# Patient Record
Sex: Female | Born: 1935
Health system: Southern US, Community
[De-identification: ages and names within clinical notes are randomized; demographics above are authoritative.]

## PROBLEM LIST (undated history)

## (undated) ENCOUNTER — Emergency Department (HOSPITAL_COMMUNITY): Admission: EM | Payer: Self-pay | Source: Home / Self Care

## (undated) DIAGNOSIS — N289 Disorder of kidney and ureter, unspecified: Secondary | ICD-10-CM

## (undated) DIAGNOSIS — E079 Disorder of thyroid, unspecified: Secondary | ICD-10-CM

## (undated) DIAGNOSIS — E7800 Pure hypercholesterolemia, unspecified: Secondary | ICD-10-CM

## (undated) DIAGNOSIS — I10 Essential (primary) hypertension: Secondary | ICD-10-CM

## (undated) HISTORY — PX: ABDOMINAL HYSTERECTOMY: SHX81

## (undated) HISTORY — PX: CHOLECYSTECTOMY: SHX55

---

## 2000-02-16 ENCOUNTER — Other Ambulatory Visit: Admission: RE | Admit: 2000-02-16 | Discharge: 2000-02-16 | Payer: Self-pay | Admitting: Obstetrics and Gynecology

## 2000-12-18 ENCOUNTER — Ambulatory Visit (HOSPITAL_COMMUNITY): Admission: RE | Admit: 2000-12-18 | Discharge: 2000-12-18 | Payer: Self-pay | Admitting: Endocrinology

## 2000-12-18 ENCOUNTER — Encounter: Payer: Self-pay | Admitting: Endocrinology

## 2001-08-15 ENCOUNTER — Other Ambulatory Visit: Admission: RE | Admit: 2001-08-15 | Discharge: 2001-08-15 | Payer: Self-pay | Admitting: Obstetrics and Gynecology

## 2006-07-02 ENCOUNTER — Emergency Department (HOSPITAL_COMMUNITY): Admission: EM | Admit: 2006-07-02 | Discharge: 2006-07-02 | Payer: Self-pay | Admitting: Emergency Medicine

## 2006-10-31 ENCOUNTER — Ambulatory Visit (HOSPITAL_COMMUNITY): Admission: RE | Admit: 2006-10-31 | Discharge: 2006-10-31 | Payer: Self-pay | Admitting: *Deleted

## 2006-11-21 ENCOUNTER — Encounter (HOSPITAL_COMMUNITY): Admission: RE | Admit: 2006-11-21 | Discharge: 2006-11-22 | Payer: Self-pay | Admitting: Endocrinology

## 2007-06-21 ENCOUNTER — Emergency Department (HOSPITAL_COMMUNITY): Admission: EM | Admit: 2007-06-21 | Discharge: 2007-06-21 | Payer: Self-pay | Admitting: Emergency Medicine

## 2007-06-22 ENCOUNTER — Inpatient Hospital Stay (HOSPITAL_COMMUNITY): Admission: EM | Admit: 2007-06-22 | Discharge: 2007-06-25 | Payer: Self-pay | Admitting: Emergency Medicine

## 2007-06-23 ENCOUNTER — Encounter (INDEPENDENT_AMBULATORY_CARE_PROVIDER_SITE_OTHER): Payer: Self-pay | Admitting: Gastroenterology

## 2008-01-17 ENCOUNTER — Emergency Department (HOSPITAL_COMMUNITY): Admission: EM | Admit: 2008-01-17 | Discharge: 2008-01-17 | Payer: Self-pay | Admitting: Emergency Medicine

## 2008-06-06 ENCOUNTER — Emergency Department (HOSPITAL_COMMUNITY): Admission: EM | Admit: 2008-06-06 | Discharge: 2008-06-06 | Payer: Self-pay | Admitting: *Deleted

## 2008-06-23 ENCOUNTER — Emergency Department (HOSPITAL_COMMUNITY): Admission: EM | Admit: 2008-06-23 | Discharge: 2008-06-23 | Payer: Self-pay | Admitting: Emergency Medicine

## 2008-08-04 ENCOUNTER — Emergency Department (HOSPITAL_COMMUNITY): Admission: EM | Admit: 2008-08-04 | Discharge: 2008-08-04 | Payer: Self-pay | Admitting: *Deleted

## 2008-08-08 ENCOUNTER — Emergency Department (HOSPITAL_COMMUNITY): Admission: EM | Admit: 2008-08-08 | Discharge: 2008-08-08 | Payer: Self-pay | Admitting: Emergency Medicine

## 2008-08-20 ENCOUNTER — Ambulatory Visit (HOSPITAL_COMMUNITY): Admission: RE | Admit: 2008-08-20 | Discharge: 2008-08-20 | Payer: Self-pay | Admitting: Otolaryngology

## 2008-08-20 ENCOUNTER — Encounter (INDEPENDENT_AMBULATORY_CARE_PROVIDER_SITE_OTHER): Payer: Self-pay | Admitting: Otolaryngology

## 2008-08-20 ENCOUNTER — Ambulatory Visit: Payer: Self-pay | Admitting: Vascular Surgery

## 2009-06-25 ENCOUNTER — Emergency Department (HOSPITAL_COMMUNITY): Admission: EM | Admit: 2009-06-25 | Discharge: 2009-06-25 | Payer: Self-pay | Admitting: Emergency Medicine

## 2009-07-12 ENCOUNTER — Encounter: Admission: RE | Admit: 2009-07-12 | Discharge: 2009-07-12 | Payer: Self-pay | Admitting: Internal Medicine

## 2010-12-24 ENCOUNTER — Emergency Department (HOSPITAL_COMMUNITY): Payer: Medicare Other

## 2010-12-24 ENCOUNTER — Emergency Department (HOSPITAL_COMMUNITY)
Admission: EM | Admit: 2010-12-24 | Discharge: 2010-12-24 | Disposition: A | Discharge status: Home / Self Care | Payer: Medicare Other | Attending: Emergency Medicine | Admitting: Emergency Medicine

## 2010-12-24 DIAGNOSIS — M79609 Pain in unspecified limb: Secondary | ICD-10-CM | POA: Insufficient documentation

## 2010-12-24 DIAGNOSIS — M5030 Other cervical disc degeneration, unspecified cervical region: Secondary | ICD-10-CM | POA: Insufficient documentation

## 2010-12-24 DIAGNOSIS — M542 Cervicalgia: Secondary | ICD-10-CM | POA: Insufficient documentation

## 2010-12-24 DIAGNOSIS — K449 Diaphragmatic hernia without obstruction or gangrene: Secondary | ICD-10-CM | POA: Insufficient documentation

## 2010-12-24 DIAGNOSIS — E039 Hypothyroidism, unspecified: Secondary | ICD-10-CM | POA: Insufficient documentation

## 2010-12-24 DIAGNOSIS — I10 Essential (primary) hypertension: Secondary | ICD-10-CM | POA: Insufficient documentation

## 2010-12-24 LAB — POCT I-STAT, CHEM 8
HCT: 36 % (ref 36.0–46.0)
Hemoglobin: 12.2 g/dL (ref 12.0–15.0)
Potassium: 3.9 mEq/L (ref 3.5–5.1)
Sodium: 140 mEq/L (ref 135–145)

## 2010-12-24 LAB — POCT CARDIAC MARKERS
CKMB, poc: 2 ng/mL (ref 1.0–8.0)
Myoglobin, poc: 151 ng/mL (ref 12–200)

## 2011-01-27 LAB — POCT CARDIAC MARKERS
CKMB, poc: 1.1 ng/mL (ref 1.0–8.0)
Troponin i, poc: <0.05 ng/mL (ref 0.00–0.09)

## 2011-01-27 LAB — URINALYSIS, ROUTINE W REFLEX MICROSCOPIC
Bilirubin Urine: NEGATIVE
Ketones, ur: NEGATIVE mg/dL
Nitrite: NEGATIVE
Protein, ur: NEGATIVE mg/dL
Urobilinogen, UA: 0.2 mg/dL (ref 0.0–1.0)
pH: 5.5 (ref 5.0–8.0)

## 2011-01-27 LAB — URINE CULTURE
Colony Count: NO GROWTH
Culture: NO GROWTH

## 2011-01-27 LAB — DIFFERENTIAL
Eosinophils Absolute: 0.1 10*3/uL (ref 0.0–0.7)
Lymphocytes Relative: 18 % (ref 12–46)
Lymphs Abs: 1.1 10*3/uL (ref 0.7–4.0)
Monocytes Relative: 5 % (ref 3–12)
Neutrophils Relative %: 76 % (ref 43–77)

## 2011-01-27 LAB — CBC
MCHC: 34 g/dL (ref 30.0–36.0)
MCV: 90.7 fL (ref 78.0–100.0)
RDW: 12.4 % (ref 11.5–15.5)

## 2011-01-27 LAB — COMPREHENSIVE METABOLIC PANEL
ALT: 13 U/L (ref 0–35)
AST: 22 U/L (ref 0–37)
Calcium: 8.6 mg/dL (ref 8.4–10.5)
Creatinine, Ser: 1.23 mg/dL — ABNORMAL HIGH (ref 0.4–1.2)
GFR calc Af Amer: 52 mL/min — ABNORMAL LOW (ref >60)
Glucose, Bld: 90 mg/dL (ref 70–99)
Sodium: 134 mEq/L — ABNORMAL LOW (ref 135–145)
Total Protein: 8 g/dL (ref 6.0–8.3)

## 2011-01-27 LAB — LIPASE, BLOOD: Lipase: 23 U/L (ref 11–59)

## 2011-03-07 NOTE — Discharge Summary (Signed)
Jennifer Patel, Jennifer Patel                ACCOUNT NO.:  [id number]   MEDICAL RECORD NO.:  [id number]          PATIENT TYPE:  INP   LOCATION:  1405                         FACILITY:  [facility]   PHYSICIAN:  Aquilla Hacker, M.D. DATE OF BIRTH:  08-30-1932   DATE OF ADMISSION:  06/22/2007  DATE OF DISCHARGE:  06/25/2007                               DISCHARGE SUMMARY   FINAL DIAGNOSES:  1. Ischemic colitis.  2. Acute blood loss anemia.  3. Hypertension, uncontrolled.   PROCEDURES:  1. Transfusion of 2 units of packed red blood cells.  2. Colonoscopy completed on June 23, 2007.  3. Two-view chest x-ray completed June 21, 2007.  4. CT scan of the head without contrast, June 21, 2007.  5. CT of abdomen with contrast, June 22, 2007.  6. CT of the pelvis, June 22, 2007.   CONSULTATIONS:  Gastroenterology with Dr. Nelwyn Salisbury.   HISTORY OF PRESENT ILLNESS:  Jennifer Patel is a 77 year old female who  arrived with a chief complaint of passing bright red blood from her  rectum.  Her symptoms started the night prior to the admission.  She  indicated that she went to the bathroom and began to have several  episodes of bright red blood with stool and sometimes without stool.  She also complained of some abdominal pain, particularly in the left  lower quadrant; therefore, she came to the hospital for further  evaluation.   PAST MEDICAL HISTORY:  Please see that dictated by Dr. Liston Alba.   HOSPITAL COURSE:  PROBLEM #1 - ISCHEMIC COLITIS:  A two-view chest x-ray  was completed on June 21, 2007 and revealed no acute chest findings.  A hiatal hernia and COPD were suspected.  The patient also had a CT scan  of her head completed without contrast, which revealed age-indeterminate  small vessel ischemic white matter changes.  Symmetric oval shape of the  orbits was also noted.  A CT scan of the patient's abdomen and pelvis  was completed on August 30.  A CT scan of the abdomen  revealed no acute  findings in the abdomen.  CT scan of the pelvis revealed colitis  extending from the splenic flexure for approximately 10 cm down the  descending colon; this could be infectious, inflammatory or ischemic.  The appearance was not typical for diverticulitis.  Gastroenterology was  consulted; Dr. Nelwyn Salisbury saw the patient and on June 23, 2007,  she did a colonoscopy.  Her impression from the colonoscopy was that the  patient had sigmoid diverticulosis.  Ischemic colitis was noted from 50-  90 cm; cold biopsies were completed.  After the completion of the  patient's colonoscopy, the patient indicated that her abdominal  discomfort had decreased significantly; likewise, she has not complained  of any ongoing bright red blood being passed from her rectum.  She was  maintained on Protonix.   PROBLEM #2 - ACUTE BLOOD LOSS ANEMIA:  The patient's hemoglobin at the  time of her hospitalization was noted to have been 10.7 with hematocrit  of 31.  By September 1, the  patient's hemoglobin had drifted down to  8.6; therefore, the patient received 2 units of packed red blood cells.  She was otherwise asymptomatic.   PROBLEM #3 - HYPERTENSION:  The patient's blood pressure has not been  ideally controlled over the course of this hospitalization.  She can  follow up with her primary care physician, at which time he may be able  to adjust her medications to optimally control her blood pressure.   CONDITION AT TIME OF DISCHARGE:  Improved.  Currently, the patient has  no complaints.   DISCHARGE VITAL SIGNS:  VITALS:  Her temperature is 98.3, heart rate 70,  respirations 16, blood pressure 149/77.  O2 SAT is 96% on room air.   LABORATORY DATA:  Her sodium is 142, her potassium is 4.4, chloride 112,  CO2 22, glucose 79, BUN 15, creatinine 1.04.  Her calcium is 9.  Her  white blood cell count is 6.5, hemoglobin 12.2, hematocrit 35.6,  platelets 158,000.  The decision has been  made to discharge the patient  from the hospital.   [place]:  1. Nexium 40 mg p.o. daily.  2. Crestor 5 mg once a day.  3. Aspirin 81 mg once a day.   FOLLOWUP:  She will be told to follow up with her primary care doctor  within the next 2-4 weeks.      Aquilla Hacker, M.D.  Electronically Signed     OR/MEDQ  D:  06/25/2007  T:  06/25/2007  Job:  [id number]

## 2011-03-07 NOTE — Op Note (Signed)
Jennifer Patel, Jennifer Patel                ACCOUNT NO.:  [id number]   MEDICAL RECORD NO.:  [id number]          PATIENT TYPE:  INP   LOCATION:  1405                         [facility]ACILITY:  [facility]   PHYSICIAN:  Nelwyn Salisbury, M.D.  DATE OF BIRTH:  01/05/1932   DATE OF PROCEDURE:  06/23/2007  DATE OF DISCHARGE:                               OPERATIVE REPORT   PROCEDURE PERFORMED:  Flexible sigmoidoscopy with multiple cold  biopsies.   ENDOSCOPIST:  Nelwyn Salisbury, MD   INSTRUMENT USED:  Pentax video colonoscope.   INDICATIONS FOR PROCEDURE:  [age]-year-old white female with a  history of rectal bleeding that started on June 21, 2007 resulting in  a drop in hemoglobin from 11.9 g/dL to 10.7 g/dL yesterday to 9.8 g/dL  today.  The patient had some left lower quadrant pain, rule out ischemic  colitis.   PREPROCEDURE PREPARATION:  Informed consent was procured from the  patient.  The patient given an enema prior to the procedure.  Risks and  benefits of the procedure were discussed with the patient in detail.   PREPROCEDURE PHYSICAL:  VITAL SIGNS:  The patient had stable vital  signs.  NECK:  Supple.  CHEST:  Clear to auscultation.  S1 and S2 regular.  ABDOMEN:  Soft with normal bowel sounds.  There is left lower quadrant  tenderness on palpation with no guarding, rebound or rigidity.  No  hepatosplenomegaly appreciated.   DESCRIPTION OF THE PROCEDURE:  The patient was placed in the left  lateral decubitus position and sedated with 50 mcg of fentanyl and 5 mg  of Versed given intravenously in slow incremental doses. Once the  patient was adequately sedated and maintained on low-flow oxygen and  continuous cardiac monitoring, the Pentax video colonoscope was advanced  from the rectum to 90 cm without difficulty.  The patient has a fairly  good prep.  There was evidence of sigmoid diverticulosis.  Severe  ischemic changes were noted with ulceration and patchy erythema from 50-  90 cm  (watershed area) at the splenic flexure.  Cold biopsies were done  x4 to confirm the diagnosis and no other abnormalities were noted.  The  scope was then withdrawn with multiple washes to make visualized the  rest of the colonic mucosa circumferentially.  Retroflexion in the  rectum revealed no abnormalities.  The patient tolerated the procedure  well without immediate complications.   IMPRESSION:  1. Sigmoid diverticulosis.  2. Ischemic colitis noted from 50-90 cm, cold biopsies done x 4.   RECOMMENDATIONS:  1. Continue supportive care.  2. Avoid all nonsteroidals for now.  3. Continue serial CBCs.  4. Outpatient followup with Dr. Jim Desanctis once the patient is      discharge from the hospital.      Nelwyn Salisbury, M.D.  Electronically Signed     JNM/MEDQ  D:  06/23/2007  T:  06/24/2007  Job:  [id number]   cc:   Ronaldo Miyamoto, M.D.  Fax: [phone]   [name], M.D.  Fax: [phone]

## 2011-03-07 NOTE — Consult Note (Signed)
Jennifer Patel, Jennifer Patel                ACCOUNT NO.:  [id number]   MEDICAL RECORD NO.:  [id number]          PATIENT TYPE:  INP   LOCATION:  [id number]                         FACILITY:  [facility]   PHYSICIAN:  Nelwyn Salisbury, M.D.  DATE OF BIRTH:  02-23-1932   DATE OF CONSULTATION:  06/22/2007  DATE OF DISCHARGE:                                 CONSULTATION   GI consultation cross cover for Dr. Jim Desanctis.   REASON FOR CONSULTATION:  Rectal bleeding with lower abdominal pain.   ASSESSMENT:  1. Rectal bleeding with left lower quadrant pain, rule out ischemic      colitis.  2. History of sigmoid diverticulosis, diagnosed by colonoscopy in      January 2008.  3. Internal hemorrhoids diagnosed by colonoscopy done earlier this      year.  4. History of gastroesophageal reflux disease.  5. Hypertension on Vasotec.  6. Hyperlipidemia on Crestor.  7. History of a questionable stroke in 1983.  8. Status post laparoscopic cholecystectomy.  9. History of a vaginal hysterectomy in 1983.   RECOMMENDATIONS:  1. Flex sig in a.m.  2. Serial CBCs.  3. Hold all nonsteroidals for now.  4. Clear liquid diet for now and NPO after midnight.  5. Further recommendations after the flexible sigmoidoscopy has been      done.   DISCUSSION:  Jennifer Patel is a pleasant 77 year old white female  with the above mentioned problems who presented to the [facility] emergency room on June 21, 2007, with rectal bleeding.  The  patient had some labs and was sent home.  However, the symptoms  persisted and she presented again today to the emergency room with  complaints of left lower quadrant pain and ongoing rectal bleeding.  She  complains that blood was pouring out of her.  She has been taking a  baby aspirin on a daily basis but denies the use of other nonsteroidals  over-the-counter or prescription nonsteroidal drugs.  Her appetite is  good.  Her weight has been stable.  She had a colonoscopy on October 31, 2006, by Dr. Jim Desanctis that revealed sigmoid diverticulosis and  internal hemorrhoids.  She also had an upper endoscopy at that time that  revealed a hiatal hernia.  The patient denies a family history of colon  cancer.  She has never had symptoms like this before.   PAST MEDICAL HISTORY:  See list above.   ALLERGIES:  No known drug allergies.   HOME MEDICATIONS:  1. Crestor.  2. Nexium.  3. Potassium chloride.  4. Aspirin 81 mg per day.  5. Vasotec.   FAMILY HISTORY:  The patient denies a family history of colon cancer or  IBD.   SOCIAL HISTORY:  She is a widow.  Her husband died within the last year.  She lives alone.  She has children who live in the area and are very  supportive of her.  She is helping raise her grandkids.  She denies the  use of alcohol, tobacco, or drugs.  She is now retired.   REVIEW  OF SYSTEMS:  Rectal bleeding, left lower quadrant pain, no  history of nausea, vomiting, fever, chills, or rigors, no history of  melena, no abnormal weight loss.   PHYSICAL EXAMINATION:  GENERAL:  Reveals a very pleasant, cooperative,  elderly, white female in no acute distress.  VITAL SIGNS:  Temperature 98.3, blood pressure 165/82, pulse of 95 per  minute, respiratory rate of 24.  HEENT:  Atraumatic, normocephalic head.  The patient is wearing glasses.  The patient 's orophyrangeal mucosa is somewhat dry.  NECK:  Supple.  CHEST:  Clear to auscultation.  HEART:  S1 S2 regular.  ABDOMEN:  Soft with left lower quadrant tenderness on palpation with no  guarding, rebound, or rigidity.  No hepatosplenomegaly appreciated.  There are laparoscopic scars in the right upper quadrant from previous  laparoscopic cholecystectomy.  RECTAL:  Revealed moderate sphincter tone with bright red blood on  examining finger.   LABORATORY EVALUATION:  On the 29th, that is yesterday when the patient  came to the ER, revealed a white count of 13.2 with a hemoglobin of 11.9  grams per  deciliter, and a platelet count of 244.  There were 85%  neutrophils on the diff.  Absolute neutrophil count was 11.3.  CBC today  revealed a white count of 8.5 with hemoglobin of 10.7, and platelet  count of 203.  PT 13.5 with INR of 1, PTT of 27.  C-MET revealed a  sodium of 137, potassium 3.8, chloride 107, CO2 21, glucose 95, BUN 10,  creatinine 1.02.  LFTs were all within normal limits.  Albumin was 3.2  with calcium 8.9.  Chest x-ray done on June 21, 2007, revealed a  hiatal hernia, question COPD but no acute chest findings.  A CT of the  head without contrast was done that revealed some vessel ischemic  changes but no other acute abnormalities.   Recommendations have been made.  Further recommendations will be made  after flexible sigmoidoscopy has been done.      Nelwyn Salisbury, M.D.  Electronically Signed     JNM/MEDQ  D:  06/23/2007  T:  06/23/2007  Job:  [id number]   cc:   Ronaldo Miyamoto, M.D.  Fax: [phone]   [name], M.D.  Fax: [phone]

## 2011-03-07 NOTE — H&P (Signed)
Jennifer Patel, Jennifer Patel                ACCOUNT NO.:  [id number]   MEDICAL RECORD NO.:  [id number]          PATIENT TYPE:  INP   LOCATION:  1405                         FACILITY:  [facility]:  Jennifer Patel, MDDATE OF BIRTH:  12-09-1931   DATE OF ADMISSION:  06/22/2007  DATE OF DISCHARGE:                              HISTORY & PHYSICAL   PRIMARY CARE PHYSICIAN:  Dr. Ronaldo Miyamoto.   GASTROENTEROLOGIST:  Dr. Jim Desanctis.   CHIEF COMPLAINT:  Bright red blood per rectum.   HISTORY OF PRESENT ILLNESS:  This is a 77 year old lady who presents to  the emergency room with complaints of bright red blood per rectum  starting last night.  According to the patient she felt a sensation of  something running down her legs and when she went to the bathroom found  that she had bright red blood per rectum. Patient states that she has  had several episodes of bright red blood with stool and sometimes  without stool.  The patient is sure that she has no vaginal bleeding and  at this time is refusing a vaginal examination.  The patient does admit  to some abdominal pain including the left lower quadrant but states it  is intermittent in nature, non radiating, and presents as sharp crampy  pain.  The pain is not necessarily associated to the episodes of bright  red blood.  She denies any chest pain.  Denies any claudication.  The  patient denies any nausea or vomiting. The patient was seen in the  emergency room yesterday with a near syncopal episode and was sent home.  She states she has had no further episode since then.   PAST MEDICAL HISTORY:  1. Significant for hypertension.  2. Hyperlipidemia.  3. Gastroesophageal reflux disease.  4. A CVA in the past.   PAST SURGICAL HISTORY:  None.   SOCIAL HISTORY:  The patient resides by herself.  She has no history of  tobacco, alcohol or drug use.   CURRENT MEDICATIONS:  1. Nexium 40 mg p.o. daily.  2. Crestor 5 mg p.o. daily.  3. Potassium  chloride 20 mEq p.o. daily.  4. Aspirin 81 mg p.o. daily.  5. Azor-5/40 mg p.o. daily.   ALLERGIES:  No known drug allergies.   CODE STATUS:  Full code.   REVIEW OF SYSTEMS:  The patient currently denies any dizziness, loss of  consciousness, seizure activity or headache. However please note in the  HPI that she did have an episode of near syncope as stated before and  was seen in the emergency room.  She denies any difficulty with  swallowing, any difficulty chewing or any mouth pain.  She denies any  tinnitus.  Patient denies any shortness of breath, dyspnea on exertion,  cough.  She denies any chest pain, palpitations or claudication.  She  denies any dysuria, urgency or frequency. She denies any weakness or any  joint pains or stiffness.  No difficulty in walking. She denies heat or  cold intolerance.  No constipation or diarrhea.  She denies any loss of  hearing.  No bruising.   All other systems are negative except as noted in the HPI.   STUDIES IN THE EMERGENCY ROOM:  White blood cell count of 8500,  hemoglobin 10.7, hematocrit 31, platelet count of 203,000.  Sodium of  137, potassium 3.8, chloride of 107, bicarb of 10, BUN of 1.02.  A CT  scan of the abdomen with contrast shows colitis extending from the  splenic flexure down the descending colon for approximately 10 cm.  The  appearance is not suggestive of diverticulitis and there is no free air  or fluid collection.  There is a large hiatal hernia noted.   PHYSICAL EXAMINATION:  GENERAL:  The patient is lying in the bed in no  acute distress.  Her family members are present in the room with her.  HEENT EXAM:  She is normocephalic, atraumatic. Pupils are equal, round  and reactive to light and accommodation. Extraocular movements are  intact.  There is no icterus, no conjunctival pallor.  Fundi are benign.  Oropharynx is moist without exudates, erythema or lesions noted. Patient  does have poor dentition.  NECK:   Trachea is midline, no masses, no thyromegaly, no JVD, no carotid  bruit noted.  RESPIRATORY EXAM:  The patient has a kyphotic posture, she has equal  excursion bilaterally. Lungs are clear to auscultation. There is no  increased work of fremitus. On palpation there is no dullness to  percussion.  CARDIOVASCULAR EXAM:  The patient has a normal S1 and S2, no murmurs,  rubs or gallops are noted. PMI is non displaced, no heaves or thrills on  palpation.  ABDOMEN:  Examination reveals an obese abdomen with normoactive bowel  sounds in all four quadrants.  Abdomen is tender in the left lower  quadrant.  There are no masses, no hepatosplenomegaly noted.  LYMPH NODE SURVEY:  She has no cervical, axillary or inguinal  lymphadenopathy noted.  MUSCULOSKELETAL:  She has no warmth or erythema around the joints. She  has functional range of motion in the joint of upper and lower  extremities.  NEUROLOGICAL EXAM:  Patient has no focal neurologic deficits.  Cranial  nerves II-XII grossly intact. DTRs are 2+ bilaterally in upper and lower  extremities.  Sensation is intact to light touch, pin prick and  proprioception.  PSYCHIATRIC:  She is alert and oriented x3.  Good cognition and insight.  Good recent and remote recall.   ASSESSMENT AND PLAN:  The patient presents with colitis. I suspect this  is mostly ischemic in nature.  The patient will continue with IV  hydration and will hold all antiplatelet agents at this time.  I have  spoken with Dr. Collene Mares who is covering for Dr. Lajoyce Corners for GI consult for a  possible flexible sigmoidoscopy in the morning. The patient  will be made n.p.o. after midnight and given clear liquids at this time.  In terms of her hypertension she will be given Vasotec intravenously as  a substitute for her usual oral medications, and we will hold her  antihyperlipid agents at this time.      Jennifer Gamer, MD  Electronically Signed     MAM/MEDQ  D:  06/22/2007  T:   06/23/2007  Job:  [id number]   cc:   [name], M.D.  [phone]   Ronaldo Miyamoto, M.D.  [phone], M.D.  Fax: [phone]

## 2011-03-10 NOTE — Op Note (Signed)
NAMEJOLYN, Jennifer Patel                ACCOUNT NO.:  [id number]   MEDICAL RECORD NO.:  [id number]          PATIENT TYPE:  AMB   LOCATION:  ENDO                         FACILITY:  [facility]   PHYSICIAN:  [name], M.D.    DATE OF BIRTH:  1931-12-07   DATE OF PROCEDURE:  10/31/2006  DATE OF DISCHARGE:                               OPERATIVE REPORT   PROCEDURE:  Upper endoscopy.   INDICATIONS:  Gastroesophageal reflux disease.   ANESTHESIA:  Fentanyl 50 mcg, Versed 5 mg.   PROCEDURE:  With the patient mildly sedated in the left lateral  decubitus position the Pentax videoscopic endoscope was inserted and  passed under direct vision through the esophagus which appeared normal  into the stomach.  Fundus, body, antrum, duodenal bulb, second portion  duodenum were visualized.  From this point the endoscope was slowly  withdrawn taking circumferential views of duodenal mucosa until the  endoscope then pulled back in the stomach placed in retroflexion to view  the stomach from below.  The endoscope was then straightened and  withdrawn taking circumferential views remaining gastric and esophageal  mucosa.  The patient's vital signs, pulse oximeter remained stable.  The  patient tolerated procedure well without apparent complications.   FINDINGS:  Hiatal hernia otherwise unremarkable examination.   PLAN:  Proceed to colonoscopy.           ______________________________  [name], M.D.     GMO/MEDQ  D:  10/31/2006  T:  10/31/2006  Job:  [id number]

## 2011-03-10 NOTE — Op Note (Signed)
Jennifer Patel, BULLMAN                ACCOUNT NO.:  [id number]   MEDICAL RECORD NO.:  [id number]          PATIENT TYPE:  AMB   LOCATION:  ENDO                         FACILITY:  [facility]   PHYSICIAN:  [name], M.D.    DATE OF BIRTH:  12-15-1931   DATE OF PROCEDURE:  10/31/2006  DATE OF DISCHARGE:                               OPERATIVE REPORT   PROCEDURE:  Colonoscopy.   INDICATIONS:  Colon cancer screening   ANESTHESIA:  Fentanyl 35 mcg, Versed 1.5 mg.   PROCEDURE:  With the patient mildly sedated in the left lateral  decubitus position the Pentax videoscopic colonoscope pediatric was  inserted into the rectum and passed under direct vision with pressure  applied and the patient rolled to her back.  We were able to reach the  cecum as identified by ileocecal valve and appendiceal orifice both of  which were photographed.  From this point the endoscope was slowly  withdrawn taking circumferential views of colonic mucosa stopping only  in the rectum which appeared normal on direct, showed hemorrhoids on  retroflexed view.  The endoscope was straightened and withdrawn.  The  patient's vital signs, pulse oximeter remained stable.  The patient  tolerated procedure well without apparent complications.   FINDINGS:  Diverticulosis of sigmoid colon, internal hemorrhoids,  otherwise an unremarkable examination.   PLAN:  Have patient follow-up with me for repeat colonoscopy in 5 years  or as needed.           ______________________________  [name], M.D.     GMO/MEDQ  D:  10/31/2006  T:  10/31/2006  Job:  [id number]

## 2011-07-17 LAB — DIFFERENTIAL
Eosinophils Absolute: 0.1
Lymphs Abs: 1
Monocytes Absolute: 0.4
Monocytes Relative: 4
Neutro Abs: 8.8 — ABNORMAL HIGH
Neutrophils Relative %: 85 — ABNORMAL HIGH

## 2011-07-17 LAB — URINE MICROSCOPIC-ADD ON

## 2011-07-17 LAB — CBC
Hemoglobin: 11.8 — ABNORMAL LOW
MCHC: 35.1
Platelets: 247
RDW: 12.7

## 2011-07-17 LAB — COMPREHENSIVE METABOLIC PANEL
ALT: 19
Albumin: 3.9
Alkaline Phosphatase: 108
Calcium: 8.6
Glucose, Bld: 115 — ABNORMAL HIGH
Potassium: 3.9
Sodium: 136
Total Protein: 7.4

## 2011-07-17 LAB — URINALYSIS, ROUTINE W REFLEX MICROSCOPIC
Glucose, UA: NEGATIVE
Hgb urine dipstick: NEGATIVE
Specific Gravity, Urine: 1.017

## 2011-07-17 LAB — OCCULT BLOOD X 1 CARD TO LAB, STOOL: Fecal Occult Bld: NEGATIVE

## 2011-07-24 LAB — URINALYSIS, ROUTINE W REFLEX MICROSCOPIC
Nitrite: NEGATIVE
Protein, ur: NEGATIVE
Specific Gravity, Urine: 1.008
Urobilinogen, UA: 0.2

## 2011-07-24 LAB — CBC
Hemoglobin: 10.7 — ABNORMAL LOW
MCHC: 33.9
Platelets: 215
RDW: 11.9

## 2011-07-24 LAB — URINE MICROSCOPIC-ADD ON

## 2011-08-04 LAB — HEMOGLOBIN AND HEMATOCRIT, BLOOD
HCT: 28 — ABNORMAL LOW
HCT: 27 — ABNORMAL LOW
Hemoglobin: 9.6 — ABNORMAL LOW

## 2011-08-04 LAB — DIFFERENTIAL
Basophils Absolute: 0
Basophils Absolute: 0
Basophils Relative: 0
Basophils Relative: 0
Basophils Relative: 0
Eosinophils Absolute: 0.2
Eosinophils Relative: 1
Lymphocytes Relative: 18
Monocytes Absolute: 0.9 — ABNORMAL HIGH
Monocytes Relative: 7
Neutro Abs: 4.5
Neutro Abs: 6.2
Neutrophils Relative %: 68
Neutrophils Relative %: 73

## 2011-08-04 LAB — CBC
HCT: 31 — ABNORMAL LOW
Hemoglobin: 10.7 — ABNORMAL LOW
MCHC: 34.1
MCHC: 34.3
MCHC: 35
MCV: 91.6
MCV: 89.6
MCV: 90.4
Platelets: 158
Platelets: 177
Platelets: 203
Platelets: 244
RBC: 2.77 — ABNORMAL LOW
RBC: 3.09 — ABNORMAL LOW
RBC: 3.46 — ABNORMAL LOW
WBC: 6
WBC: 6.5
WBC: 13.2 — ABNORMAL HIGH
WBC: 6.6
WBC: 8.5

## 2011-08-04 LAB — COMPREHENSIVE METABOLIC PANEL
AST: 28
Albumin: 4.1
Alkaline Phosphatase: 100
Alkaline Phosphatase: 92
BUN: 10
CO2: 21
Chloride: 107
Chloride: 109
Creatinine, Ser: 1.02
GFR calc Af Amer: 41 — ABNORMAL LOW
GFR calc non Af Amer: 53 — ABNORMAL LOW
Glucose, Bld: 95
Potassium: 4
Total Bilirubin: 0.9
Total Bilirubin: 0.9

## 2011-08-04 LAB — CROSSMATCH: ABO/RH(D): O POS

## 2011-08-04 LAB — BASIC METABOLIC PANEL
BUN: 15
BUN: 6
CO2: 22
Calcium: 8.7
Chloride: 112
Creatinine, Ser: 1.04
Creatinine, Ser: 0.92
GFR calc Af Amer: >60

## 2011-08-04 LAB — POCT CARDIAC MARKERS: CKMB, poc: 2.1

## 2011-08-04 LAB — URINE CULTURE: Colony Count: 10000

## 2011-08-04 LAB — URINALYSIS, ROUTINE W REFLEX MICROSCOPIC
Glucose, UA: NEGATIVE
Ketones, ur: NEGATIVE
Protein, ur: NEGATIVE

## 2011-08-04 LAB — APTT: aPTT: 27

## 2011-08-04 LAB — ABO/RH: ABO/RH(D): O POS

## 2011-08-04 LAB — PROTIME-INR: Prothrombin Time: 13.5

## 2012-01-04 ENCOUNTER — Emergency Department (HOSPITAL_COMMUNITY)
Admission: EM | Admit: 2012-01-04 | Discharge: 2012-01-04 | Disposition: A | Discharge status: Home / Self Care | Payer: Medicare Other | Attending: Emergency Medicine | Admitting: Emergency Medicine

## 2012-01-04 ENCOUNTER — Emergency Department (HOSPITAL_COMMUNITY): Payer: Medicare Other

## 2012-01-04 ENCOUNTER — Encounter (HOSPITAL_COMMUNITY): Payer: Self-pay

## 2012-01-04 ENCOUNTER — Other Ambulatory Visit: Payer: Self-pay

## 2012-01-04 DIAGNOSIS — E039 Hypothyroidism, unspecified: Secondary | ICD-10-CM | POA: Insufficient documentation

## 2012-01-04 DIAGNOSIS — Z79899 Other long term (current) drug therapy: Secondary | ICD-10-CM | POA: Insufficient documentation

## 2012-01-04 DIAGNOSIS — R911 Solitary pulmonary nodule: Secondary | ICD-10-CM

## 2012-01-04 DIAGNOSIS — E7800 Pure hypercholesterolemia, unspecified: Secondary | ICD-10-CM | POA: Insufficient documentation

## 2012-01-04 DIAGNOSIS — R05 Cough: Secondary | ICD-10-CM

## 2012-01-04 DIAGNOSIS — I10 Essential (primary) hypertension: Secondary | ICD-10-CM | POA: Insufficient documentation

## 2012-01-04 DIAGNOSIS — R0682 Tachypnea, not elsewhere classified: Secondary | ICD-10-CM | POA: Insufficient documentation

## 2012-01-04 DIAGNOSIS — R0602 Shortness of breath: Secondary | ICD-10-CM | POA: Insufficient documentation

## 2012-01-04 DIAGNOSIS — R059 Cough, unspecified: Secondary | ICD-10-CM

## 2012-01-04 HISTORY — DX: Disorder of thyroid, unspecified: E07.9

## 2012-01-04 HISTORY — DX: Essential (primary) hypertension: I10

## 2012-01-04 HISTORY — DX: Pure hypercholesterolemia, unspecified: E78.00

## 2012-01-04 LAB — CBC
HCT: 33.5 % — ABNORMAL LOW (ref 36.0–46.0)
Hemoglobin: 11.4 g/dL — ABNORMAL LOW (ref 12.0–15.0)
MCHC: 34 g/dL (ref 30.0–36.0)
RBC: 3.93 MIL/uL (ref 3.87–5.11)

## 2012-01-04 LAB — BASIC METABOLIC PANEL
BUN: 34 mg/dL — ABNORMAL HIGH (ref 6–23)
Chloride: 104 mEq/L (ref 96–112)
GFR calc Af Amer: 46 mL/min — ABNORMAL LOW (ref >90)
Potassium: 4.1 mEq/L (ref 3.5–5.1)

## 2012-01-04 MED ORDER — ALBUTEROL SULFATE (5 MG/ML) 0.5% IN NEBU
5.0000 mg | INHALATION_SOLUTION | Freq: Once | RESPIRATORY_TRACT | Status: AC
  Administered 2012-01-04: 5 mg via RESPIRATORY_TRACT
  Filled 2012-01-04: qty 1

## 2012-01-04 MED ORDER — IOHEXOL 300 MG/ML  SOLN
100.0000 mL | Freq: Once | INTRAMUSCULAR | Status: AC | PRN
  Administered 2012-01-04: 100 mL via INTRAVENOUS

## 2012-01-04 NOTE — Discharge Instructions (Signed)
  RETURN IMMEDIATELY IF you develop new shortness of breath, confusion or altered mental status, a new rash, become dizzy, faint, or poorly responsive, or are unable to be cared for at home.

## 2012-01-04 NOTE — ED Notes (Signed)
Pt c/o nonproductive cough x3wks, states saw PCP last Friday and given clarithromycin in which was told to stop today d/t throat and face tightness. Pt speaking in complete sentences, no distress

## 2012-01-04 NOTE — Progress Notes (Signed)
confirmed with pt that pcp is Gareth Eagle

## 2012-01-04 NOTE — ED Notes (Signed)
RT called

## 2012-01-04 NOTE — ED Provider Notes (Signed)
History     CSN: [id number]  Arrival date & time 01/04/12  1112   First MD Initiated Contact with Patient 01/04/12 1129      Chief Complaint  Patient presents with  . URI    cough x3wks  . Shortness of Breath  . Allergic Reaction    to clarithromycin which was given for the cough    (Consider location/radiation/quality/duration/timing/severity/associated sxs/prior treatment) HPI Pt has had a cough for three weeks.  She went to her doctor last Friday and was started on antibiotics.  Pt is having persistent cough and nasal congestion.  She has had some diarrhea as well.  No fevers.  No chest pain.  She feels like she is unable to bring up congestion.  Pt is having some pain in her neck. Past Medical History  Diagnosis Date  . Hypertension   . Thyroid disease   . High cholesterol     Past Surgical History  Procedure Date  . Abdominal hysterectomy   . Cholecystectomy     No family history on file.  History  Substance Use Topics  . Smoking status: Former Research scientist (life sciences)  . Smokeless tobacco: Not on file  . Alcohol Use: No    OB History    Grav Para Term Preterm Abortions TAB SAB Ect Mult Living                  Review of Systems  All other systems reviewed and are negative.    Allergies  Clarithromycin  Home Medications   Current Outpatient Rx  Name Route Sig Dispense Refill  . AMLODIPINE BESYLATE-VALSARTAN 5-160 MG PO TABS Oral Take 1 tablet by mouth daily.    . ASPIRIN EC 81 MG PO TBEC Oral Take 81 mg by mouth daily.    Marland Kitchen BENZONATATE 100 MG PO CAPS Oral Take 100 mg by mouth 4 (four) times daily as needed. cough    . CLARITHROMYCIN ER 500 MG PO TB24 Oral Take 1,000 mg by mouth daily.    . FUROSEMIDE 40 MG PO TABS Oral Take 40 mg by mouth daily.    Marland Kitchen LANSOPRAZOLE 30 MG PO CPDR Oral Take 30 mg by mouth daily.    Marland Kitchen LEVOTHYROXINE SODIUM 75 MCG PO TABS Oral Take 75 mcg by mouth daily.    Marland Kitchen PRAVASTATIN SODIUM 80 MG PO TABS Oral Take 80 mg by mouth daily.      BP  149/69  Pulse 101  Temp(Src) 98.2 F (36.8 C) (Oral)  Resp 18  SpO2 100%  Physical Exam  Nursing note and vitals reviewed. Constitutional: She appears well-developed and well-nourished. No distress.  HENT:  Head: Normocephalic and atraumatic.  Right Ear: External ear normal.  Left Ear: External ear normal.  Mouth/Throat: No oropharyngeal exudate.       Mild hoarseness of her voice  Eyes: Conjunctivae are normal. Right eye exhibits no discharge. Left eye exhibits no discharge. No scleral icterus.  Neck: Neck supple. No tracheal deviation present.  Cardiovascular: Normal rate, regular rhythm and intact distal pulses.   Pulmonary/Chest: Effort normal and breath sounds normal. No stridor. No respiratory distress. She has no wheezes. She has no rales.       Slight tachypnea  Abdominal: Soft. Bowel sounds are normal. She exhibits no distension. There is no tenderness. There is no rebound and no guarding.  Musculoskeletal: She exhibits no edema and no tenderness.  Lymphadenopathy:    She has no cervical adenopathy.  Neurological: She is alert. She has  normal strength. No sensory deficit. Cranial nerve deficit:  no gross defecits noted. She exhibits normal muscle tone. She displays no seizure activity. Coordination normal.  Skin: Skin is warm and dry. No rash noted.  Psychiatric: She has a normal mood and affect.    ED Course  Procedures (including critical care time)  Date: 01/04/2012  Rate: 92  Rhythm: normal sinus rhythm  QRS Axis: normal  Intervals: PR shortened  ST/T Wave abnormalities: normal except call R waves anteriorly in Q waves inferior laterally  Conduction Disutrbances:none  Narrative Interpretation:   Old EKG Reviewed: unchanged   Labs Reviewed  BASIC METABOLIC PANEL - Abnormal; Notable for the following:    Glucose, Bld 106 (*)    BUN 34 (*)    Creatinine, Ser 1.27 (*)    GFR calc non Af Amer 40 (*)    GFR calc Af Amer 46 (*)    All other components within  normal limits  CBC - Abnormal; Notable for the following:    Hemoglobin 11.4 (*)    HCT 33.5 (*)    All other components within normal limits  D-DIMER, QUANTITATIVE - Abnormal; Notable for the following:    D-Dimer, Quant 0.90 (*)    All other components within normal limits  PRO B NATRIURETIC PEPTIDE   Dg Chest 2 View  01/04/2012  *RADIOLOGY REPORT*  Clinical Data: Coughing, shortness of breath  CHEST - 2 VIEW  Comparison: December 29, 2011  Findings: A moderate sized hiatal hernia is again noted.  The cardiac silhouette, mediastinum, pulmonary vasculature are within normal limits. Nodularity in the right mid - upper lung is unchanged.  There are no focal infiltrates or effusions.  IMPRESSION: There is no evidence of acute cardiac or pulmonary process.  No change in subtle nodularity in the mid - upper right lung.  CT scan on a non emergent basis is recommended to exclude developing neoplasm.  Original Report Authenticated By: Duayne Cal, M.D.    No diagnosis found.   MDM  Pt's symptoms are more suggestive of URI however she does have an elevated d dimer.  Will ct to rule out pe.  On exam, pt has no stridor.  No facial swelling noted.  If CT is negative anticipate that patient will be able to go home.  Dr Stevie Kern will follow up on CT scan.        Kathalene Frames, MD 01/06/12 [phone]

## 2012-01-06 NOTE — ED Provider Notes (Signed)
History     CSN: [id number]  Arrival date & time 01/04/12  1112     HPI      Review of Systems  Physical Exam  ED Course  Procedures (including critical care time)  Labs Reviewed  BASIC METABOLIC PANEL - Abnormal; Notable for the following:    Glucose, Bld 106 (*)    BUN 34 (*)    Creatinine, Ser 1.27 (*)    GFR calc non Af Amer 40 (*)    GFR calc Af Amer 46 (*)    All other components within normal limits  CBC - Abnormal; Notable for the following:    Hemoglobin 11.4 (*)    HCT 33.5 (*)    All other components within normal limits  D-DIMER, QUANTITATIVE - Abnormal; Notable for the following:    D-Dimer, Quant 0.90 (*)    All other components within normal limits  PRO B NATRIURETIC PEPTIDE  LAB REPORT - SCANNED   Dg Chest 2 View  01/04/2012  *RADIOLOGY REPORT*  Clinical Data: Coughing, shortness of breath  CHEST - 2 VIEW  Comparison: December 29, 2011  Findings: A moderate sized hiatal hernia is again noted.  The cardiac silhouette, mediastinum, pulmonary vasculature are within normal limits. Nodularity in the right mid - upper lung is unchanged.  There are no focal infiltrates or effusions.  IMPRESSION: There is no evidence of acute cardiac or pulmonary process.  No change in subtle nodularity in the mid - upper right lung.  CT scan on a non emergent basis is recommended to exclude developing neoplasm.  Original Report Authenticated By: Duayne Cal, M.D.   Ct Angio Chest W/cm &/or Wo Cm  01/04/2012  *RADIOLOGY REPORT*  Clinical Data: Shortness of breath with increased D-dimer today. Chest pain.  CT ANGIOGRAPHY CHEST  Technique:  Multidetector CT imaging of the chest using the standard protocol during bolus administration of intravenous contrast. Multiplanar reconstructed images including MIPs were obtained and reviewed to evaluate the vascular anatomy.  Contrast: 138mL OMNIPAQUE IOHEXOL 300 MG/ML IJ SOLN  Comparison: Plain films earlier today.  No prior CT.  Findings: Lung  windows demonstrate 4 mm right upper lobe lung nodule on image 31. Minimal right upper lobe clustered nodularity.  Posteriorly on image 35. A portion of this could correspond to the plain film abnormality at the level of the carina (image 28 today)  Dilated secretion filled right lower lobe less so left lower lobe bronchi.  Minimal nodular opacity in the left lower lobe on image 34 and along the superior segment left lower lobe bronchovascular interstitium on image 29.  Soft tissue windows:  The quality of this exam for evaluation of pulmonary embolism is good to excellent.  No evidence of pulmonary embolism.  Normal aortic caliber without dissection.  Mild cardiomegaly with multivessel coronary artery atherosclerosis. No pericardial or pleural effusion.  A precarinal node measures 9 mm on image 27, and is likely reactive.  No hilar adenopathy.  Moderate to large hiatal hernia, with approximately one half of the stomach positioned within the chest.  Limited abdominal imaging demonstrates no significant findings. Accentuation of expected thoracic kyphosis. No acute osseous abnormality.  IMPRESSION:  1. No evidence of pulmonary embolism. 2.  Material within the lower lobe endobronchial tree, with dilated bronchi.  Scattered reticular nodular opacities.  Findings are suspicious for aspiration.  Atypical infection felt less likely. 2.  Moderate-to-large hiatal hernia.  This likely predisposes the patient aspiration. 3.  4 mm right upper lobe  lung nodule. If the patient is at high risk for bronchogenic carcinoma, follow-up chest CT at 1 year is recommended.  If the patient is at low risk, no follow-up is needed.   This recommendation follows the consensus statement: "Guidelines for Management of Small Pulmonary Nodules Detected on CT Scans:  A Statement from the [employer]" as published in Radiology 2005; 237:395-400.  Available online at: https://www.arnold.com/.  Original Report  Authenticated By: Areta Haber, M.D.     1. Cough   2. Lung nodule       MDM  Pt stable in ED with no significant deterioration in condition.Patient / Family / Caregiver informed of clinical course, understand medical decision-making process, and agree with plan.        Babette Relic, MD 01/06/12 0010

## 2012-02-14 ENCOUNTER — Other Ambulatory Visit: Payer: Self-pay | Admitting: Otolaryngology

## 2012-02-14 DIAGNOSIS — R1310 Dysphagia, unspecified: Secondary | ICD-10-CM

## 2012-02-19 ENCOUNTER — Ambulatory Visit
Admission: RE | Admit: 2012-02-19 | Discharge: 2012-02-19 | Disposition: A | Discharge status: Home / Self Care | Payer: Medicare Other | Source: Ambulatory Visit | Attending: Otolaryngology | Admitting: Otolaryngology

## 2012-02-19 DIAGNOSIS — R1310 Dysphagia, unspecified: Secondary | ICD-10-CM

## 2013-10-20 ENCOUNTER — Ambulatory Visit (INDEPENDENT_AMBULATORY_CARE_PROVIDER_SITE_OTHER): Payer: Medicare Other | Admitting: Podiatry

## 2013-10-20 ENCOUNTER — Encounter: Payer: Self-pay | Admitting: Podiatry

## 2013-10-20 VITALS — BP 96/69 | HR 81 | Resp 18

## 2013-10-20 DIAGNOSIS — Q828 Other specified congenital malformations of skin: Secondary | ICD-10-CM

## 2013-10-20 NOTE — Progress Notes (Signed)
° °  Subjective:    Patient ID: Jennifer Patel, female    DOB: [birth year], 79 y.o.   MRN: [id number]  HPI  My right foot been going on for years and I have been coming to him for years and I have a few places on the ball of my right foot and my toenails are discolored on my big toenails  Patient presents for ongoing debridement of painful hyperkeratotic lesions at approximately three-month intervals. Her last visit for this service was 05/19/2013. She has been a patient in this practice since 2008.  Review of Systems  Constitutional: Negative.   HENT: Negative.   Eyes: Negative.        Eye surgery  Respiratory: Negative.   Cardiovascular: Negative.   Gastrointestinal: Negative.   Endocrine: Negative.   Genitourinary: Negative.   Musculoskeletal: Negative.   Skin: Negative.   Allergic/Immunologic: Negative.   Neurological: Positive for dizziness.  Hematological: Bruises/bleeds easily.  Psychiatric/Behavioral: Negative.        Objective:   Physical Exam Nucleated, circumscribed,hyperkeratotic lesions plantar first and fifth MPJ right and fifth left MPJ.       Assessment & Plan:   Assessment: Porokeratoses x3  Plan: The lesions were were debrided and packed with salinocaine. Reappoint intervals recommended a 3 months.

## 2014-01-12 ENCOUNTER — Ambulatory Visit: Payer: Medicare Other | Admitting: Podiatry

## 2014-02-02 ENCOUNTER — Ambulatory Visit (INDEPENDENT_AMBULATORY_CARE_PROVIDER_SITE_OTHER): Payer: Medicare Other | Admitting: Podiatry

## 2014-02-02 ENCOUNTER — Encounter: Payer: Self-pay | Admitting: Podiatry

## 2014-02-02 VITALS — BP 142/76 | HR 90 | Resp 12

## 2014-02-02 DIAGNOSIS — Q828 Other specified congenital malformations of skin: Secondary | ICD-10-CM

## 2014-02-02 NOTE — Progress Notes (Signed)
Patient ID: Jennifer Patel, female   DOB: 06/04/1932, 80 y.o.   MRN: [id number]   Subjective: This patient presents for ongoing debridement of painful porokeratoses.  Objective: Nucleated keratoses first and fifth MPJ right first MPJ left  Assessment: Porokeratoses  Plan: Porokeratoses debrided and salinocaine applied to lesions on right. Reappoint at three-month intervals.

## 2014-02-05 ENCOUNTER — Emergency Department (HOSPITAL_COMMUNITY)
Admission: EM | Admit: 2014-02-05 | Discharge: 2014-02-05 | Disposition: A | Discharge status: Home / Self Care | Payer: Medicare Other | Attending: Emergency Medicine | Admitting: Emergency Medicine

## 2014-02-05 ENCOUNTER — Emergency Department (HOSPITAL_COMMUNITY): Payer: Medicare Other

## 2014-02-05 ENCOUNTER — Encounter (HOSPITAL_COMMUNITY): Payer: Self-pay | Admitting: Emergency Medicine

## 2014-02-05 DIAGNOSIS — Z792 Long term (current) use of antibiotics: Secondary | ICD-10-CM | POA: Insufficient documentation

## 2014-02-05 DIAGNOSIS — Z87891 Personal history of nicotine dependence: Secondary | ICD-10-CM | POA: Insufficient documentation

## 2014-02-05 DIAGNOSIS — Z79899 Other long term (current) drug therapy: Secondary | ICD-10-CM | POA: Insufficient documentation

## 2014-02-05 DIAGNOSIS — M79673 Pain in unspecified foot: Secondary | ICD-10-CM

## 2014-02-05 DIAGNOSIS — M25579 Pain in unspecified ankle and joints of unspecified foot: Secondary | ICD-10-CM | POA: Insufficient documentation

## 2014-02-05 DIAGNOSIS — M25476 Effusion, unspecified foot: Secondary | ICD-10-CM | POA: Insufficient documentation

## 2014-02-05 DIAGNOSIS — E079 Disorder of thyroid, unspecified: Secondary | ICD-10-CM | POA: Insufficient documentation

## 2014-02-05 DIAGNOSIS — M25473 Effusion, unspecified ankle: Secondary | ICD-10-CM | POA: Insufficient documentation

## 2014-02-05 DIAGNOSIS — I10 Essential (primary) hypertension: Secondary | ICD-10-CM | POA: Insufficient documentation

## 2014-02-05 DIAGNOSIS — E7800 Pure hypercholesterolemia, unspecified: Secondary | ICD-10-CM | POA: Insufficient documentation

## 2014-02-05 MED ORDER — NAPROXEN 250 MG PO TABS: 250.0000 mg | ORAL_TABLET | Freq: Three times a day (TID) | ORAL | Status: DC

## 2014-02-05 MED ORDER — NAPROXEN 500 MG PO TABS
250.0000 mg | ORAL_TABLET | Freq: Once | ORAL | Status: AC
  Administered 2014-02-05: 250 mg via ORAL
  Filled 2014-02-05: qty 1

## 2014-02-05 NOTE — ED Notes (Signed)
Pt reports developing left sided ankle/foot pain on Monday with swelling. Pt denies injury or trauma. Pt has moderate swelling of the ankle. Pt has strong pedal pulses and capillary refill is less than three seconds in the foot. Pt reports being seen by her podiatrist on Monday evening, however the symptoms started that night. Pt reports difficulty bearing weight on the affected side.

## 2014-02-05 NOTE — Discharge Instructions (Signed)
Cryotherapy Cryotherapy means treatment with cold. Ice or gel packs can be used to reduce both pain and swelling. Ice is the most helpful within the first 24 to 48 hours after an injury or flareup from overusing a muscle or joint. Sprains, strains, spasms, burning pain, shooting pain, and aches can all be eased with ice. Ice can also be used when recovering from surgery. Ice is effective, has very few side effects, and is safe for most people to use. PRECAUTIONS  Ice is not a safe treatment option for people with:  Raynaud's phenomenon. This is a condition affecting small blood vessels in the extremities. Exposure to cold may cause your problems to return.  Cold hypersensitivity. There are many forms of cold hypersensitivity, including:  Cold urticaria. Red, itchy hives appear on the skin when the tissues begin to warm after being iced.  Cold erythema. This is a red, itchy rash caused by exposure to cold.  Cold hemoglobinuria. Red blood cells break down when the tissues begin to warm after being iced. The hemoglobin that carry oxygen are passed into the urine because they cannot combine with blood proteins fast enough.  Numbness or altered sensitivity in the area being iced. If you have any of the following conditions, do not use ice until you have discussed cryotherapy with your caregiver:  Heart conditions, such as arrhythmia, angina, or chronic heart disease.  High blood pressure.  Healing wounds or open skin in the area being iced.  Current infections.  Rheumatoid arthritis.  Poor circulation.  Diabetes. Ice slows the blood flow in the region it is applied. This is beneficial when trying to stop inflamed tissues from spreading irritating chemicals to surrounding tissues. However, if you expose your skin to cold temperatures for too long or without the proper protection, you can damage your skin or nerves. Watch for signs of skin damage due to cold. HOME CARE INSTRUCTIONS Follow  these tips to use ice and cold packs safely.  Place a dry or damp towel between the ice and skin. A damp towel will cool the skin more quickly, so you may need to shorten the time that the ice is used.  For a more rapid response, add gentle compression to the ice.  Ice for no more than 10 to 20 minutes at a time. The bonier the area you are icing, the less time it will take to get the benefits of ice.  Check your skin after 5 minutes to make sure there are no signs of a poor response to cold or skin damage.  Rest 20 minutes or more in between uses.  Once your skin is numb, you can end your treatment. You can test numbness by very lightly touching your skin. The touch should be so light that you do not see the skin dimple from the pressure of your fingertip. When using ice, most people will feel these normal sensations in this order: cold, burning, aching, and numbness.  Do not use ice on someone who cannot communicate their responses to pain, such as small children or people with dementia. HOW TO MAKE AN ICE PACK Ice packs are the most common way to use ice therapy. Other methods include ice massage, ice baths, and cryo-sprays. Muscle creams that cause a cold, tingly feeling do not offer the same benefits that ice offers and should not be used as a substitute unless recommended by your caregiver. To make an ice pack, do one of the following:  Place crushed ice or  a bag of frozen vegetables in a sealable plastic bag. Squeeze out the excess air. Place this bag inside another plastic bag. Slide the bag into a pillowcase or place a damp towel between your skin and the bag.  Mix 3 parts water with 1 part rubbing alcohol. Freeze the mixture in a sealable plastic bag. When you remove the mixture from the freezer, it will be slushy. Squeeze out the excess air. Place this bag inside another plastic bag. Slide the bag into a pillowcase or place a damp towel between your skin and the bag. SEEK MEDICAL  CARE IF:  You develop white spots on your skin. This may give the skin a blotchy (mottled) appearance.  Your skin turns blue or pale.  Your skin becomes waxy or hard.  Your swelling gets worse. MAKE SURE YOU:   Understand these instructions.  Will watch your condition.  Will get help right away if you are not doing well or get worse. Document Released: 06/05/2011 Document Revised: 01/01/2012 Document Reviewed: 06/05/2011 [facility] Patient Information 2014 [place], [place]. Wear the ACE fro the next several days rest the foot/leg as much as possible with elevation   If not getting better in 2-3 days please see your PCP for further evaluation  If you develop new or worsening symptoms please return sooner

## 2014-02-05 NOTE — ED Provider Notes (Signed)
CSN: [id number]     Arrival date & time 02/05/14  1924 History  This chart was scribed for non-physician practitioner Garald Balding, NP working with No att. providers found by Adriana Reams, ED Scribe. This patient was seen in room WTR5/WTR5 and the patient's care was started at 8:15 PM.     Chief Complaint  Patient presents with  . Foot Pain      The history is provided by the patient and a relative. No language interpreter was used.   HPI Comments: Jennifer Patel is a 80 y.o. female who presents to the Emergency Department complaining of 4 days of  gradual onset, gradually worsening left  foot and ankle pain. She denies recent injury or trauma. She saw her podiatrist 4 day ago, but the pain began after that. She also scraped her foot 4 days ago before she saw her podiatrist, and the cut has been improving. Walking makes the pain worse. She has tried Vaseline and foot cream with no relief. She denies any other symptoms.    Past Medical History  Diagnosis Date  . Hypertension   . Thyroid disease   . High cholesterol    Past Surgical History  Procedure Laterality Date  . Abdominal hysterectomy    . Cholecystectomy     No family history on file. History  Substance Use Topics  . Smoking status: Former Research scientist (life sciences)  . Smokeless tobacco: Not on file  . Alcohol Use: No   OB History   Grav Para Term Preterm Abortions TAB SAB Ect Mult Living                 Review of Systems  Constitutional: Negative for fever and chills.  Cardiovascular: Negative for leg swelling.  Musculoskeletal: Positive for arthralgias and joint swelling.  Skin: Positive for wound.  Neurological: Negative for dizziness, weakness and numbness.  All other systems reviewed and are negative.     Allergies  Clarithromycin  Home Medications   Prior to Admission medications   Medication Sig Start Date End Date Taking? Authorizing Provider  amLODipine-valsartan (EXFORGE) 5-160 MG per tablet Take 1 tablet by  mouth daily.    Historical Provider, MD  aspirin EC 81 MG tablet Take 81 mg by mouth daily.    Historical Provider, MD  benzonatate (TESSALON) 100 MG capsule Take 100 mg by mouth 4 (four) times daily as needed. cough    Historical Provider, MD  clarithromycin (BIAXIN XL) 500 MG 24 hr tablet Take 1,000 mg by mouth daily.    Historical Provider, MD  furosemide (LASIX) 40 MG tablet Take 40 mg by mouth daily.    Historical Provider, MD  lansoprazole (PREVACID) 30 MG capsule Take 30 mg by mouth daily.    Historical Provider, MD  levothyroxine (SYNTHROID, LEVOTHROID) 75 MCG tablet Take 75 mcg by mouth daily.    Historical Provider, MD  pravastatin (PRAVACHOL) 80 MG tablet Take 80 mg by mouth daily.    Historical Provider, MD   BP 169/73  Pulse 88  Temp(Src) 98.9 F (37.2 C) (Oral)  Resp 18  SpO2 97%  Physical Exam  Nursing note and vitals reviewed. Constitutional: She is oriented to person, place, and time. She appears well-developed and well-nourished.  HENT:  Head: Normocephalic.  Eyes: Pupils are equal, round, and reactive to light.  Neck: Normal range of motion.  Cardiovascular: Normal rate and regular rhythm.   Pulmonary/Chest: Effort normal and breath sounds normal.  Musculoskeletal: Normal range of motion. She exhibits  edema and tenderness.       Left ankle: She exhibits swelling. She exhibits normal range of motion, no ecchymosis, no deformity, no laceration and normal pulse. Tenderness. No lateral malleolus and no medial malleolus tenderness found.       Feet:  Neurological: She is alert and oriented to person, place, and time.  Skin: No erythema.    ED Course  Procedures (including critical care time) DIAGNOSTIC STUDIES: Oxygen Saturation is 97% on RA, normal by my interpretation.    COORDINATION OF CARE: 8:15 PM Discussed treatment plan with pt which included 250 mg naproxen and ace bandage at bedside and pt agreed to plan. RICE protocol discussed. Advised pt to follow up  with orthopedist if symptoms do not improve, referral given. Return precautions advised.     Labs Review Labs Reviewed - No data to display  Imaging Review Dg Ankle Complete Left  02/05/2014   CLINICAL DATA:  Atraumatic left ankle pain.  Preceding 3 days  EXAM: LEFT ANKLE COMPLETE - 3+ VIEW  COMPARISON:  None.  FINDINGS: Four views of the left ankle reveal the bones to be mildly osteopenic. The ankle joint mortise is preserved. The talar dome is intact. There is no evidence of an acute malleolar fracture. The talus and calcaneus appear intact. There is a moderate-sized Achilles region calcaneal spur. There is a tiny plantar calcaneal spur. Where visualized the metatarsal bases appear intact. There is mild diffuse soft tissue swelling. No soft tissue gas is demonstrated.  IMPRESSION: There is diffuse osteopenia and mild diffuse soft tissue swelling. There is no evidence of an acute fracture or dislocation. There is an Achilles region calcaneal spur.   Electronically Signed   By: David  Martinique   On: 02/05/2014 19:59     EKG Interpretation None      MDM   Final diagnoses:  None           Garald Balding, NP 02/05/14 2058

## 2014-02-09 NOTE — ED Provider Notes (Signed)
Medical screening examination/treatment/procedure(s) were performed by non-physician practitioner and as supervising physician I was immediately available for consultation/collaboration.   EKG Interpretation None       Jaevian Shean M Prestyn Mahn, MD 02/09/14 2230 

## 2014-03-06 ENCOUNTER — Emergency Department (HOSPITAL_COMMUNITY)
Admission: EM | Admit: 2014-03-06 | Discharge: 2014-03-06 | Disposition: A | Discharge status: Home / Self Care | Payer: Medicare Other | Attending: Emergency Medicine | Admitting: Emergency Medicine

## 2014-03-06 ENCOUNTER — Encounter (HOSPITAL_COMMUNITY): Payer: Self-pay | Admitting: Emergency Medicine

## 2014-03-06 DIAGNOSIS — E7800 Pure hypercholesterolemia, unspecified: Secondary | ICD-10-CM | POA: Insufficient documentation

## 2014-03-06 DIAGNOSIS — M542 Cervicalgia: Secondary | ICD-10-CM | POA: Insufficient documentation

## 2014-03-06 DIAGNOSIS — Z79899 Other long term (current) drug therapy: Secondary | ICD-10-CM | POA: Insufficient documentation

## 2014-03-06 DIAGNOSIS — E079 Disorder of thyroid, unspecified: Secondary | ICD-10-CM | POA: Insufficient documentation

## 2014-03-06 DIAGNOSIS — M7989 Other specified soft tissue disorders: Secondary | ICD-10-CM

## 2014-03-06 DIAGNOSIS — I10 Essential (primary) hypertension: Secondary | ICD-10-CM | POA: Insufficient documentation

## 2014-03-06 DIAGNOSIS — Z7982 Long term (current) use of aspirin: Secondary | ICD-10-CM | POA: Insufficient documentation

## 2014-03-06 DIAGNOSIS — Z87891 Personal history of nicotine dependence: Secondary | ICD-10-CM | POA: Insufficient documentation

## 2014-03-06 DIAGNOSIS — Z791 Long term (current) use of non-steroidal anti-inflammatories (NSAID): Secondary | ICD-10-CM | POA: Insufficient documentation

## 2014-03-06 LAB — BASIC METABOLIC PANEL
BUN: 22 mg/dL (ref 6–23)
CALCIUM: 9.3 mg/dL (ref 8.4–10.5)
CO2: 24 mEq/L (ref 19–32)
Chloride: 101 mEq/L (ref 96–112)
Creatinine, Ser: 1.29 mg/dL — ABNORMAL HIGH (ref 0.50–1.10)
GFR, EST AFRICAN AMERICAN: 44 mL/min — AB (ref >90)
GFR, EST NON AFRICAN AMERICAN: 38 mL/min — AB (ref >90)
Glucose, Bld: 109 mg/dL — ABNORMAL HIGH (ref 70–99)
POTASSIUM: 3.6 meq/L — AB (ref 3.7–5.3)
SODIUM: 142 meq/L (ref 137–147)

## 2014-03-06 LAB — CBC
HCT: 31.1 % — ABNORMAL LOW (ref 36.0–46.0)
Hemoglobin: 10.5 g/dL — ABNORMAL LOW (ref 12.0–15.0)
MCH: 29.9 pg (ref 26.0–34.0)
MCHC: 33.8 g/dL (ref 30.0–36.0)
MCV: 88.6 fL (ref 78.0–100.0)
PLATELETS: 210 10*3/uL (ref 150–400)
RBC: 3.51 MIL/uL — ABNORMAL LOW (ref 3.87–5.11)
RDW: 14.2 % (ref 11.5–15.5)
WBC: 7.4 10*3/uL (ref 4.0–10.5)

## 2014-03-06 MED ORDER — ACETAMINOPHEN 325 MG PO TABS
650.0000 mg | ORAL_TABLET | Freq: Once | ORAL | Status: AC
  Administered 2014-03-06: 650 mg via ORAL
  Filled 2014-03-06: qty 2

## 2014-03-06 MED ORDER — ENOXAPARIN SODIUM 60 MG/0.6ML ~~LOC~~ SOLN
1.0000 mg/kg | Freq: Once | SUBCUTANEOUS | Status: AC
  Administered 2014-03-06: 50 mg via SUBCUTANEOUS
  Filled 2014-03-06: qty 0.6

## 2014-03-06 NOTE — ED Notes (Signed)
She noticed her L foot has been swollen since last night. She denies any injuries to her foot. She c/o pain in her foot. She also states her neck hurts every time she turns her head for past 2 days.

## 2014-03-06 NOTE — Discharge Instructions (Signed)
Elevate your feet above your heart, as much as possible. Return at 8 a.m., in the morning, to get the ultrasound of the left leg, to see if you have a blood clot.    Edema Edema is an abnormal build-up of fluids in tissues. Because this is partly dependent on gravity (water flows to the lowest place), it is more common in the legs and thighs (lower extremities). It is also common in the looser tissues, like around the eyes. Painless swelling of the feet and ankles is common and increases as a person ages. It may affect both legs and may include the calves or even thighs. When squeezed, the fluid may move out of the affected area and may leave a dent for a few moments. CAUSES   Prolonged standing or sitting in one place for extended periods of time. Movement helps pump tissue fluid into the veins, and absence of movement prevents this, resulting in edema.  Varicose veins. The valves in the veins do not work as well as they should. This causes fluid to leak into the tissues.  Fluid and salt overload.  Injury, burn, or surgery to the leg, ankle, or foot, may damage veins and allow fluid to leak out.  Sunburn damages vessels. Leaky vessels allow fluid to go out into the sunburned tissues.  Allergies (from insect bites or stings, medications or chemicals) cause swelling by allowing vessels to become leaky.  Protein in the blood helps keep fluid in your vessels. Low protein, as in malnutrition, allows fluid to leak out.  Hormonal changes, including pregnancy and menstruation, cause fluid retention. This fluid may leak out of vessels and cause edema.  Medications that cause fluid retention. Examples are sex hormones, blood pressure medications, steroid treatment, or anti-depressants.  Some illnesses cause edema, especially heart failure, kidney disease, or liver disease.  Surgery that cuts veins or lymph nodes, such as surgery done for the heart or for breast cancer, may result in  edema. DIAGNOSIS  Your caregiver is usually easily able to determine what is causing your swelling (edema) by simply asking what is wrong (getting a history) and examining you (doing a physical). Sometimes x-rays, EKG (electrocardiogram or heart tracing), and blood work may be done to evaluate for underlying medical illness. TREATMENT  General treatment includes:  Leg elevation (or elevation of the affected body part).  Restriction of fluid intake.  Prevention of fluid overload.  Compression of the affected body part. Compression with elastic bandages or support stockings squeezes the tissues, preventing fluid from entering and forcing it back into the blood vessels.  Diuretics (also called water pills or fluid pills) pull fluid out of your body in the form of increased urination. These are effective in reducing the swelling, but can have side effects and must be used only under your caregiver's supervision. Diuretics are appropriate only for some types of edema. The specific treatment can be directed at any underlying causes discovered. Heart, liver, or kidney disease should be treated appropriately. HOME CARE INSTRUCTIONS   Elevate the legs (or affected body part) above the level of the heart, while lying down.  Avoid sitting or standing still for prolonged periods of time.  Avoid putting anything directly under the knees when lying down, and do not wear constricting clothing or garters on the upper legs.  Exercising the legs causes the fluid to work back into the veins and lymphatic channels. This may help the swelling go down.  The pressure applied by elastic bandages or  support stockings can help reduce ankle swelling.  A low-salt diet may help reduce fluid retention and decrease the ankle swelling.  Take any medications exactly as prescribed. SEEK MEDICAL CARE IF:  Your edema is not responding to recommended treatments. SEEK IMMEDIATE MEDICAL CARE IF:   You develop shortness  of breath or chest pain.  You cannot breathe when you lay down; or if, while lying down, you have to get up and go to the window to get your breath.  You are having increasing swelling without relief from treatment.  You develop a fever over 102 F (38.9 C).  You develop pain or redness in the areas that are swollen.  Tell your caregiver right away if you have gained 03 lb/1.4 kg in 1 day or 05 lb/2.3 kg in a week. MAKE SURE YOU:   Understand these instructions.  Will watch your condition.  Will get help right away if you are not doing well or get worse. Document Released: 10/09/2005 Document Revised: 04/09/2012 Document Reviewed: 05/27/2008 [facility] Patient Information 2014 [place].

## 2014-03-06 NOTE — ED Provider Notes (Signed)
CSN: [id number]     Arrival date & time 03/06/14  1758 History   First MD Initiated Contact with Patient 03/06/14 2052     Chief Complaint  Patient presents with  . Foot Swelling  . Neck Pain     (Consider location/radiation/quality/duration/timing/severity/associated sxs/prior Treatment) HPI  ANNALEIA Patel is a 80 y.o. female  She complains of left lower leg swelling for 2 days. She denies trauma. She has never had this previously. He denies chest pain, shortness of breath, weakness, dizziness, fever, chills, nausea or vomiting. She has had mild, left-sided neck pain is worse with movement for 5 days. No trauma to the neck. She's taking usual medications, without relief  Past Medical History  Diagnosis Date  . Hypertension   . Thyroid disease   . High cholesterol    Past Surgical History  Procedure Laterality Date  . Abdominal hysterectomy    . Cholecystectomy     History reviewed. No pertinent family history. History  Substance Use Topics  . Smoking status: Former Research scientist (life sciences)  . Smokeless tobacco: Not on file  . Alcohol Use: No   OB History   Grav Para Term Preterm Abortions TAB SAB Ect Mult Living                 Review of Systems  All other systems reviewed and are negative.     Allergies  Clarithromycin  Home Medications   Prior to Admission medications   Medication Sig Start Date End Date Taking? Authorizing Provider  acetaminophen (TYLENOL) 325 MG tablet Take 650 mg by mouth every 6 (six) hours as needed.   Yes Historical Provider, MD  amLODipine-valsartan (EXFORGE) 5-160 MG per tablet Take 1 tablet by mouth daily.   Yes Historical Provider, MD  aspirin EC 81 MG tablet Take 81 mg by mouth daily.   Yes Historical Provider, MD  Cyanocobalamin (VITAMIN B 12 PO) Take 1,000 mcg by mouth every 30 (thirty) days.   Yes Historical Provider, MD  furosemide (LASIX) 40 MG tablet Take 40 mg by mouth daily.   Yes Historical Provider, MD  lansoprazole (PREVACID) 30 MG  capsule Take 30 mg by mouth daily.   Yes Historical Provider, MD  levothyroxine (SYNTHROID, LEVOTHROID) 75 MCG tablet Take 75 mcg by mouth daily.   Yes Historical Provider, MD  naproxen (NAPROSYN) 250 MG tablet Take 1 tablet (250 mg total) by mouth 3 (three) times daily with meals. 02/05/14  Yes Garald Balding, NP  pravastatin (PRAVACHOL) 80 MG tablet Take 80 mg by mouth daily.   Yes Historical Provider, MD   BP 157/64  Pulse 103  Temp(Src) 98.3 F (36.8 C) (Oral)  Resp 16  Ht 4\' 9"  (1.448 m)  Wt 108 lb 4.8 oz (49.125 kg)  BMI 23.43 kg/m2  SpO2 97% Physical Exam  Nursing note and vitals reviewed. Constitutional: She is oriented to person, place, and time. She appears well-developed.  Elderly, frail  HENT:  Head: Normocephalic and atraumatic.  Eyes: Conjunctivae and EOM are normal. Pupils are equal, round, and reactive to light.  Neck: Normal range of motion and phonation normal. Neck supple.  Cardiovascular: Normal rate, regular rhythm and intact distal pulses.   Pulmonary/Chest: Effort normal and breath sounds normal. She exhibits no tenderness.  Abdominal: Soft. She exhibits no distension. There is no tenderness. There is no guarding.  Musculoskeletal: Normal range of motion.  Left lower leg, tender and swollen as compared to the right. There is mild bruising in the left  medial lower leg, near the knee. There are no areas of significant erythema, fluctuance or drainage. There is normal distal capillary refill in the toes of both feet.  Neurological: She is alert and oriented to person, place, and time. She exhibits normal muscle tone.  Skin: Skin is warm and dry.  Psychiatric: She has a normal mood and affect. Her behavior is normal. Judgment and thought content normal.    ED Course  Procedures (including critical care time)  Medications  enoxaparin (LOVENOX) injection 50 mg (50 mg Subcutaneous Given 03/06/14 2133)  acetaminophen (TYLENOL) tablet 650 mg (650 mg Oral Given 03/06/14  2202)    Patient Vitals for the past 24 hrs:  BP Temp Temp src Pulse Resp SpO2 Height Weight  03/06/14 2202 133/64 mmHg - - 94 16 96 % - -  03/06/14 1803 157/64 mmHg 98.3 F (36.8 C) Oral 103 16 97 % 4\' 9"  (1.448 m) 108 lb 4.8 oz (49.125 kg)      Labs Review Labs Reviewed  CBC - Abnormal; Notable for the following:    RBC 3.51 (*)    Hemoglobin 10.5 (*)    HCT 31.1 (*)    All other components within normal limits  BASIC METABOLIC PANEL - Abnormal; Notable for the following:    Potassium 3.6 (*)    Glucose, Bld 109 (*)    Creatinine, Ser 1.29 (*)    GFR calc non Af Amer 38 (*)    GFR calc Af Amer 44 (*)    All other components within normal limits    I MDM   Final diagnoses:  Leg swelling    Unilateral leg swelling with concern for DVT. No pulmonary symptoms. No known trauma. No apparent cellulitis.   Nursing Notes Reviewed/ Care Coordinated Applicable Imaging Reviewed Interpretation of Laboratory Data incorporated into ED treatment  The patient appears reasonably screened and/or stabilized for discharge and I doubt any other medical condition or other [facility] requiring further screening, evaluation, or treatment in the ED at this time prior to discharge.  Plan: Home Medications- usual; Home Treatments- elevate legs; return here if the recommended treatment, does not improve the symptoms; Recommended follow up- return in morning for Doppler Left leg      Richarda Blade, MD 03/07/14 [phone]

## 2014-03-07 ENCOUNTER — Ambulatory Visit (HOSPITAL_COMMUNITY)
Admission: RE | Admit: 2014-03-07 | Discharge: 2014-03-07 | Disposition: A | Discharge status: Home / Self Care | Payer: Medicare Other | Source: Ambulatory Visit | Attending: Emergency Medicine | Admitting: Emergency Medicine

## 2014-03-07 DIAGNOSIS — M7989 Other specified soft tissue disorders: Secondary | ICD-10-CM

## 2014-05-04 ENCOUNTER — Encounter: Payer: Self-pay | Admitting: Podiatry

## 2014-05-04 ENCOUNTER — Ambulatory Visit (INDEPENDENT_AMBULATORY_CARE_PROVIDER_SITE_OTHER): Payer: Medicare Other | Admitting: Podiatry

## 2014-05-04 VITALS — BP 142/70 | HR 88 | Resp 16 | Ht <= 58 in | Wt 108.0 lb

## 2014-05-04 DIAGNOSIS — Q828 Other specified congenital malformations of skin: Secondary | ICD-10-CM

## 2014-05-05 NOTE — Progress Notes (Signed)
Patient ID: Jennifer Patel, female   DOB: 1931/11/13, 80 y.o.   MRN: [id number]  Subjective: This patient presents for ongoing debridement of painful porokeratoses  Objective: Nucleated keratoses first and fifth MPJs bilaterally  Assessment: Porokeratoses x4  Plan: Porokeratoses debrided x4 without a bleeding Applied salinocaine to the porokeratoses on the right foot  Patient is quite uncomfortable in between debridement and is requesting her return visit in 2 months

## 2014-07-13 ENCOUNTER — Encounter: Payer: Self-pay | Admitting: Podiatry

## 2014-07-13 ENCOUNTER — Ambulatory Visit (INDEPENDENT_AMBULATORY_CARE_PROVIDER_SITE_OTHER): Payer: Medicare Other | Admitting: Podiatry

## 2014-07-13 VITALS — BP 145/57 | HR 82 | Resp 18

## 2014-07-13 DIAGNOSIS — Q828 Other specified congenital malformations of skin: Secondary | ICD-10-CM

## 2014-07-13 NOTE — Progress Notes (Signed)
Patient ID: Jennifer Patel, female   DOB: 1932-01-10, 80 y.o.   MRN: [id number] Subjective: This patient presents complaining of painful plantar calluses  Objective: Nucleated keratoses sub-1 and 5 MPJ bilaterally  Assessment: Porokeratosis x4  Plan: Debrided keratoses x4 without any bleeding  Apply salinocaine 1 and 5 right  Reappoint x2 months

## 2014-09-14 ENCOUNTER — Encounter: Payer: Self-pay | Admitting: Podiatry

## 2014-09-14 ENCOUNTER — Ambulatory Visit (INDEPENDENT_AMBULATORY_CARE_PROVIDER_SITE_OTHER): Payer: Medicare Other | Admitting: Podiatry

## 2014-09-14 DIAGNOSIS — Q828 Other specified congenital malformations of skin: Secondary | ICD-10-CM

## 2014-09-15 NOTE — Progress Notes (Signed)
Patient ID: Jennifer Patel, female   DOB: Mar 07, 1932, 80 y.o.   MRN: [id number]  Subjective: Patient presents today complaining of painful plantar callus keratoses. She returns at approximately 2 months intervals for repetitive debridement.  Objective: Nucleated plantar keratoses first and fifth MPJ right Non-nucleated keratoses first and fifth MPJ left  Assessment: Porokeratosis 2 Keratoses 2  Plan: Porokeratosis and keratoses debrided without a bleeding Apply salinocaine first and fifth MPJ right lesions 2  Reappoint at patient's request which is 2 months

## 2014-09-16 ENCOUNTER — Ambulatory Visit: Payer: Medicare Other | Admitting: Podiatry

## 2014-11-09 ENCOUNTER — Ambulatory Visit (INDEPENDENT_AMBULATORY_CARE_PROVIDER_SITE_OTHER): Payer: PPO | Admitting: Podiatry

## 2014-11-09 ENCOUNTER — Encounter: Payer: Self-pay | Admitting: Podiatry

## 2014-11-09 DIAGNOSIS — Q828 Other specified congenital malformations of skin: Secondary | ICD-10-CM

## 2014-11-09 NOTE — Progress Notes (Signed)
Patient ID: Jennifer Patel, female   DOB: November 10, 1931, 81 y.o.   MRN: [id number] Subjective: This patient presents for ongoing debridement of painful plantar keratoses  Objective: Nucleated plantar keratoses sub-first and fifth MPJ right and nonnucleated keratoses sub-first and fifth MPJ left  Assessment: Porokeratosis 2 Keratoses 2  Plan: Debride porokeratosis and keratoses without a bleeding Apply salinocaine first and fifth MPJ keratoses right  Reappointed to month intervals

## 2014-11-16 ENCOUNTER — Ambulatory Visit: Payer: Medicare Other | Admitting: Podiatry

## 2015-01-18 ENCOUNTER — Ambulatory Visit (INDEPENDENT_AMBULATORY_CARE_PROVIDER_SITE_OTHER): Payer: PPO | Admitting: Podiatry

## 2015-01-18 ENCOUNTER — Encounter: Payer: Self-pay | Admitting: Podiatry

## 2015-01-18 DIAGNOSIS — Q828 Other specified congenital malformations of skin: Secondary | ICD-10-CM

## 2015-01-19 NOTE — Progress Notes (Signed)
Patient ID: Jennifer Patel, female   DOB: 11-02-1931, 81 y.o.   MRN: [id number]  Subjective: This patient presents for ongoing debridement of painful plantar calluses  Objective: Nucleated plantar keratoses sub-first and fifth MPJ right and nonnucleated keratoses sub-first and fifth MPJ left  Assessment: Porokeratosis 2 Keratoses 2  Plan: Debridement of porokeratosis and keratoses 4 without any bleeding  Apply salinocaine first and fifth MPJ keratoses right  Reappoint 2 months

## 2015-03-15 ENCOUNTER — Ambulatory Visit: Payer: PPO | Admitting: Podiatry

## 2015-03-29 ENCOUNTER — Ambulatory Visit: Payer: PPO | Admitting: Podiatry

## 2015-04-06 ENCOUNTER — Ambulatory Visit (INDEPENDENT_AMBULATORY_CARE_PROVIDER_SITE_OTHER): Payer: PPO | Admitting: Podiatry

## 2015-04-06 ENCOUNTER — Encounter: Payer: Self-pay | Admitting: Podiatry

## 2015-04-06 DIAGNOSIS — Q828 Other specified congenital malformations of skin: Secondary | ICD-10-CM | POA: Diagnosis not present

## 2015-04-06 NOTE — Progress Notes (Signed)
Patient ID: Jennifer Patel, female   DOB: [birth year], 81 y.o.   MRN: [id number]  Subjective: This patient presents today requesting debridement of painful plantar calluses  Objective: Nucleated plantar keratoses sub-first and fifth MPJ right and nonnucleated keratoses sub-first and fifth MPJ left  Assessment: Porokeratosis 2 Keratoses 2  Plan: Debridement of porokeratosis and keratoses 4 without any bleeding Apply salinocaine first and fifth MPJ keratoses right  Reappoint at 2 month intervals

## 2015-05-13 ENCOUNTER — Emergency Department (HOSPITAL_COMMUNITY)
Admission: EM | Admit: 2015-05-13 | Discharge: 2015-05-13 | Disposition: A | Discharge status: Home / Self Care | Payer: PPO | Attending: Emergency Medicine | Admitting: Emergency Medicine

## 2015-05-13 ENCOUNTER — Encounter (HOSPITAL_COMMUNITY): Payer: Self-pay | Admitting: *Deleted

## 2015-05-13 DIAGNOSIS — E079 Disorder of thyroid, unspecified: Secondary | ICD-10-CM | POA: Diagnosis not present

## 2015-05-13 DIAGNOSIS — M47812 Spondylosis without myelopathy or radiculopathy, cervical region: Secondary | ICD-10-CM

## 2015-05-13 DIAGNOSIS — Z79899 Other long term (current) drug therapy: Secondary | ICD-10-CM | POA: Diagnosis not present

## 2015-05-13 DIAGNOSIS — I10 Essential (primary) hypertension: Secondary | ICD-10-CM | POA: Diagnosis not present

## 2015-05-13 DIAGNOSIS — M47892 Other spondylosis, cervical region: Secondary | ICD-10-CM | POA: Insufficient documentation

## 2015-05-13 DIAGNOSIS — Z791 Long term (current) use of non-steroidal anti-inflammatories (NSAID): Secondary | ICD-10-CM | POA: Insufficient documentation

## 2015-05-13 DIAGNOSIS — M5412 Radiculopathy, cervical region: Secondary | ICD-10-CM | POA: Diagnosis not present

## 2015-05-13 DIAGNOSIS — Z87891 Personal history of nicotine dependence: Secondary | ICD-10-CM | POA: Insufficient documentation

## 2015-05-13 DIAGNOSIS — M542 Cervicalgia: Secondary | ICD-10-CM | POA: Diagnosis present

## 2015-05-13 DIAGNOSIS — Z7982 Long term (current) use of aspirin: Secondary | ICD-10-CM | POA: Diagnosis not present

## 2015-05-13 DIAGNOSIS — E780 Pure hypercholesterolemia: Secondary | ICD-10-CM | POA: Diagnosis not present

## 2015-05-13 MED ORDER — PREDNISONE 20 MG PO TABS: 20.0000 mg | ORAL_TABLET | Freq: Two times a day (BID) | ORAL | Status: DC

## 2015-05-13 MED ORDER — TRAMADOL-ACETAMINOPHEN 37.5-325 MG PO TABS: 1.0000 | ORAL_TABLET | Freq: Four times a day (QID) | ORAL | Status: DC | PRN

## 2015-05-13 NOTE — Discharge Instructions (Signed)
Use heat on the sore area 3 or 4 times a day.    Cervical Radiculopathy Cervical radiculopathy happens when a nerve in the neck is pinched or bruised by a slipped (herniated) disk or by arthritic changes in the bones of the cervical spine. This can occur due to an injury or as part of the normal aging process. Pressure on the cervical nerves can cause pain or numbness that runs from your neck all the way down into your arm and fingers. CAUSES  There are many possible causes, including:  Injury.  Muscle tightness in the neck from overuse.  Swollen, painful joints (arthritis).  Breakdown or degeneration in the bones and joints of the spine (spondylosis) due to aging.  Bone spurs that may develop near the cervical nerves. SYMPTOMS  Symptoms include pain, weakness, or numbness in the affected arm and hand. Pain can be severe or irritating. Symptoms may be worse when extending or turning the neck. DIAGNOSIS  Your caregiver will ask about your symptoms and do a physical exam. He or she may test your strength and reflexes. X-rays, CT scans, and MRI scans may be needed in cases of injury or if the symptoms do not go away after a period of time. Electromyography (EMG) or nerve conduction testing may be done to study how your nerves and muscles are working. TREATMENT  Your caregiver may recommend certain exercises to help relieve your symptoms. Cervical radiculopathy can, and often does, get better with time and treatment. If your problems continue, treatment options may include:  Wearing a soft collar for short periods of time.  Physical therapy to strengthen the neck muscles.  Medicines, such as nonsteroidal anti-inflammatory drugs (NSAIDs), oral corticosteroids, or spinal injections.  Surgery. Different types of surgery may be done depending on the cause of your problems. HOME CARE INSTRUCTIONS   Put ice on the affected area.  Put ice in a plastic bag.  Place a towel between your skin  and the bag.  Leave the ice on for 15-20 minutes, 03-04 times a day or as directed by your caregiver.  If ice does not help, you can try using heat. Take a warm shower or bath, or use a hot water bottle as directed by your caregiver.  You may try a gentle neck and shoulder massage.  Use a flat pillow when you sleep.  Only take over-the-counter or prescription medicines for pain, discomfort, or fever as directed by your caregiver.  If physical therapy was prescribed, follow your caregiver's directions.  If a soft collar was prescribed, use it as directed. SEEK IMMEDIATE MEDICAL CARE IF:   Your pain gets much worse and cannot be controlled with medicines.  You have weakness or numbness in your hand, arm, face, or leg.  You have a high fever or a stiff, rigid neck.  You lose bowel or bladder control (incontinence).  You have trouble with walking, balance, or speaking. MAKE SURE YOU:   Understand these instructions.  Will watch your condition.  Will get help right away if you are not doing well or get worse. Document Released: 07/04/2001 Document Revised: 01/01/2012 Document Reviewed: 05/23/2011 [facility] Patient Information 2015 [place], [place]. This information is not intended to replace advice given to you by your health care provider. Make sure you discuss any questions you have with your health care provider.

## 2015-05-13 NOTE — ED Provider Notes (Signed)
CSN: [id number]     Arrival date & time 05/13/15  1815 History  This chart was scribed for Daleen Bo, MD by Peyton Bottoms, ED Scribe. This patient was seen in room WTR7/WTR7 and the patient's care was started at 7:12 PM.   Chief Complaint  Patient presents with  . Neck Pain   Patient is a 81 y.o. female presenting with neck pain. The history is provided by the patient. No language interpreter was used.  Neck Pain  HPI Comments: Jennifer Patel is a 81 y.o. female with a PMHx of hypertension, hyperlipidemia and thyroid disease, who presents to the Emergency Department complaining of moderate neck pain that initially began 6 months ago but progressively worsened last night. She states that the pain radiates down to her left arm and up to her head. She states that she was unable to sleep last night due to pain. No meds taken PTA. Pt was seen by PCP 3 days ago regarding other concerns and states that her PCP is aware of her neck pain and "does not do anything about it".   Past Medical History  Diagnosis Date  . Hypertension   . Thyroid disease   . High cholesterol    Past Surgical History  Procedure Laterality Date  . Abdominal hysterectomy    . Cholecystectomy     No family history on file. History  Substance Use Topics  . Smoking status: Former Research scientist (life sciences)  . Smokeless tobacco: Never Used  . Alcohol Use: No   OB History    No data available     Review of Systems  Musculoskeletal: Positive for neck pain.  All other systems reviewed and are negative.  Allergies  Clarithromycin  Home Medications   Prior to Admission medications   Medication Sig Start Date End Date Taking? Authorizing Provider  acetaminophen (TYLENOL) 325 MG tablet Take 650 mg by mouth every 6 (six) hours as needed.    Historical Provider, MD  allopurinol (ZYLOPRIM) 100 MG tablet  06/12/14   Historical Provider, MD  amLODipine-valsartan (EXFORGE) 5-160 MG per tablet Take 1 tablet by mouth daily.    Historical  Provider, MD  aspirin EC 81 MG tablet Take 81 mg by mouth daily.    Historical Provider, MD  Cyanocobalamin (VITAMIN B 12 PO) Take 1,000 mcg by mouth every 30 (thirty) days.    Historical Provider, MD  furosemide (LASIX) 40 MG tablet Take 40 mg by mouth daily.    Historical Provider, MD  lansoprazole (PREVACID) 30 MG capsule Take 30 mg by mouth daily.    Historical Provider, MD  levothyroxine (SYNTHROID, LEVOTHROID) 75 MCG tablet Take 75 mcg by mouth daily.    Historical Provider, MD  naproxen (NAPROSYN) 250 MG tablet Take 1 tablet (250 mg total) by mouth 3 (three) times daily with meals. 02/05/14   Junius Creamer, NP  pravastatin (PRAVACHOL) 80 MG tablet Take 80 mg by mouth daily.    Historical Provider, MD  predniSONE (DELTASONE) 20 MG tablet Take 1 tablet (20 mg total) by mouth 2 (two) times daily. 05/13/15   Daleen Bo, MD  traMADol-acetaminophen (ULTRACET) 37.5-325 MG per tablet Take 1 tablet by mouth every 6 (six) hours as needed. 05/13/15   Daleen Bo, MD   Triage Vitals: BP 174/73 mmHg  Pulse 79  Temp(Src) 97.7 F (36.5 C) (Oral)  Resp 16  SpO2 96%  Physical Exam  Constitutional: She is oriented to person, place, and time. She appears well-developed and well-nourished.  HENT:  Head:  Normocephalic and atraumatic.  Eyes: Conjunctivae and EOM are normal. Pupils are equal, round, and reactive to light.  Neck: Normal range of motion and phonation normal. Neck supple.  Tenderness over lateral paracervical region.  Cardiovascular: Normal rate and regular rhythm.   Pulmonary/Chest: Effort normal and breath sounds normal. She exhibits no tenderness.  Abdominal: Soft. She exhibits no distension. There is no tenderness. There is no guarding.  Musculoskeletal: Normal range of motion. She exhibits tenderness.  No posterior cervical spine tenderness. Thoracic kyphosis and no tenderness over thoracic and lumbar spine.  Neurological: She is alert and oriented to person, place, and time. She  exhibits normal muscle tone.  Skin: Skin is warm and dry.  Psychiatric: She has a normal mood and affect. Her behavior is normal. Judgment and thought content normal.  Nursing note and vitals reviewed.  ED Course  Procedures (including critical care time)  DIAGNOSTIC STUDIES: Oxygen Saturation is 96% on RA, normal by my interpretation.    COORDINATION OF CARE: 7:15 PM- Discussed plans to give her medication for pain management. Advised pt to be seen by bone doctor if symptoms don't improve. Pt advised of plan for treatment and pt agrees.  MDM   Final diagnoses:  Cervical radiculopathy  Degenerative joint disease of cervical spine     Evaluation consistent with musculoskeletal neck pain. Suspect mild cervical neuropathy. Doubt spinal stenosis.  Nursing Notes Reviewed/ Care Coordinated Applicable Imaging Reviewed Interpretation of Laboratory Data incorporated into ED treatment  The patient appears reasonably screened and/or stabilized for discharge and I doubt any other medical condition or other [facility] requiring further screening, evaluation, or treatment in the ED at this time prior to discharge.  Plan: Home Medications- prednisone, tramadol; Home Treatments- heat treatment; return here if the recommended treatment, does not improve the symptoms; Recommended follow up- PCP 1 week for checkup     I personally performed the services described in this documentation, which was scribed in my presence. The recorded information has been reviewed and is accurate.    Daleen Bo, MD 05/14/15 [phone]

## 2015-05-13 NOTE — ED Notes (Signed)
Pt reports L side neck pain for more than 6 months, states she came today because she was not able to sleep last night d/t pain.  Pt reports pain radiates from her neck down to her L elbow.  She also reports her PCP had done xray in the past and has been telling him each time she sees him but would not do anything for it.

## 2015-05-22 ENCOUNTER — Emergency Department (HOSPITAL_COMMUNITY): Payer: PPO

## 2015-05-22 ENCOUNTER — Observation Stay (HOSPITAL_COMMUNITY)
Admission: EM | Admit: 2015-05-22 | Discharge: 2015-05-24 | Disposition: A | Discharge status: Home Health | Payer: PPO | Attending: Internal Medicine | Admitting: Internal Medicine

## 2015-05-22 ENCOUNTER — Encounter (HOSPITAL_COMMUNITY): Payer: Self-pay | Admitting: *Deleted

## 2015-05-22 DIAGNOSIS — E039 Hypothyroidism, unspecified: Secondary | ICD-10-CM | POA: Diagnosis not present

## 2015-05-22 DIAGNOSIS — E780 Pure hypercholesterolemia: Secondary | ICD-10-CM | POA: Diagnosis not present

## 2015-05-22 DIAGNOSIS — N179 Acute kidney failure, unspecified: Secondary | ICD-10-CM | POA: Diagnosis present

## 2015-05-22 DIAGNOSIS — R9431 Abnormal electrocardiogram [ECG] [EKG]: Principal | ICD-10-CM | POA: Diagnosis present

## 2015-05-22 DIAGNOSIS — R197 Diarrhea, unspecified: Secondary | ICD-10-CM | POA: Diagnosis not present

## 2015-05-22 DIAGNOSIS — I10 Essential (primary) hypertension: Secondary | ICD-10-CM | POA: Diagnosis present

## 2015-05-22 DIAGNOSIS — Z7982 Long term (current) use of aspirin: Secondary | ICD-10-CM | POA: Diagnosis not present

## 2015-05-22 DIAGNOSIS — M542 Cervicalgia: Secondary | ICD-10-CM

## 2015-05-22 DIAGNOSIS — R05 Cough: Secondary | ICD-10-CM | POA: Insufficient documentation

## 2015-05-22 DIAGNOSIS — E785 Hyperlipidemia, unspecified: Secondary | ICD-10-CM | POA: Diagnosis not present

## 2015-05-22 DIAGNOSIS — Z87891 Personal history of nicotine dependence: Secondary | ICD-10-CM | POA: Insufficient documentation

## 2015-05-22 DIAGNOSIS — D649 Anemia, unspecified: Secondary | ICD-10-CM | POA: Insufficient documentation

## 2015-05-22 DIAGNOSIS — R221 Localized swelling, mass and lump, neck: Secondary | ICD-10-CM

## 2015-05-22 LAB — I-STAT CHEM 8, ED
BUN: 38 mg/dL — AB (ref 6–20)
CALCIUM ION: 1.07 mmol/L — AB (ref 1.13–1.30)
CHLORIDE: 99 mmol/L — AB (ref 101–111)
Creatinine, Ser: 1.7 mg/dL — ABNORMAL HIGH (ref 0.44–1.00)
GLUCOSE: 93 mg/dL (ref 65–99)
HCT: 40 % (ref 36.0–46.0)
Hemoglobin: 13.6 g/dL (ref 12.0–15.0)
Potassium: 3.3 mmol/L — ABNORMAL LOW (ref 3.5–5.1)
Sodium: 138 mmol/L (ref 135–145)
TCO2: 23 mmol/L (ref 0–100)

## 2015-05-22 LAB — CBC WITH DIFFERENTIAL/PLATELET
BASOS ABS: 0 10*3/uL (ref 0.0–0.1)
Basophils Relative: 0 % (ref 0–1)
Eosinophils Absolute: 0 10*3/uL (ref 0.0–0.7)
Eosinophils Relative: 0 % (ref 0–5)
HCT: 35.5 % — ABNORMAL LOW (ref 36.0–46.0)
Hemoglobin: 12.1 g/dL (ref 12.0–15.0)
Lymphocytes Relative: 9 % — ABNORMAL LOW (ref 12–46)
Lymphs Abs: 0.8 10*3/uL (ref 0.7–4.0)
MCH: 31.7 pg (ref 26.0–34.0)
MCHC: 34.1 g/dL (ref 30.0–36.0)
MCV: 92.9 fL (ref 78.0–100.0)
Monocytes Absolute: 0.7 10*3/uL (ref 0.1–1.0)
Monocytes Relative: 8 % (ref 3–12)
Neutro Abs: 7.8 10*3/uL — ABNORMAL HIGH (ref 1.7–7.7)
Neutrophils Relative %: 83 % — ABNORMAL HIGH (ref 43–77)
Platelets: 230 10*3/uL (ref 150–400)
RBC: 3.82 MIL/uL — ABNORMAL LOW (ref 3.87–5.11)
RDW: 14.5 % (ref 11.5–15.5)
WBC: 9.4 10*3/uL (ref 4.0–10.5)

## 2015-05-22 LAB — I-STAT TROPONIN, ED
Troponin i, poc: 0.01 ng/mL (ref 0.00–0.08)
Troponin i, poc: 0.01 ng/mL (ref 0.00–0.08)

## 2015-05-22 MED ORDER — TRAMADOL HCL 50 MG PO TABS
25.0000 mg | ORAL_TABLET | Freq: Once | ORAL | Status: AC
  Administered 2015-05-22: 25 mg via ORAL
  Filled 2015-05-22: qty 1

## 2015-05-22 MED ORDER — AMLODIPINE BESYLATE-VALSARTAN 5-160 MG PO TABS: 0.5000 | ORAL_TABLET | Freq: Every day | ORAL | Status: DC

## 2015-05-22 MED ORDER — IRBESARTAN 75 MG PO TABS
75.0000 mg | ORAL_TABLET | Freq: Every day | ORAL | Status: DC
  Administered 2015-05-23 – 2015-05-24 (×2): 75 mg via ORAL
  Filled 2015-05-22 (×2): qty 1

## 2015-05-22 MED ORDER — ALLOPURINOL 100 MG PO TABS
200.0000 mg | ORAL_TABLET | Freq: Every day | ORAL | Status: DC
  Administered 2015-05-23 – 2015-05-24 (×2): 200 mg via ORAL
  Filled 2015-05-22 (×2): qty 2

## 2015-05-22 MED ORDER — ASPIRIN EC 81 MG PO TBEC
81.0000 mg | DELAYED_RELEASE_TABLET | Freq: Every day | ORAL | Status: DC
  Administered 2015-05-22 – 2015-05-24 (×3): 81 mg via ORAL
  Filled 2015-05-22 (×3): qty 1

## 2015-05-22 MED ORDER — ATORVASTATIN CALCIUM 10 MG PO TABS
10.0000 mg | ORAL_TABLET | Freq: Every day | ORAL | Status: DC
  Administered 2015-05-23 – 2015-05-24 (×2): 10 mg via ORAL
  Filled 2015-05-22 (×2): qty 1

## 2015-05-22 MED ORDER — ALPRAZOLAM 0.25 MG PO TABS
0.2500 mg | ORAL_TABLET | Freq: Two times a day (BID) | ORAL | Status: DC | PRN
  Administered 2015-05-22 – 2015-05-23 (×2): 0.25 mg via ORAL
  Filled 2015-05-22 (×2): qty 1

## 2015-05-22 MED ORDER — POTASSIUM CHLORIDE IN NACL 40-0.9 MEQ/L-% IV SOLN
INTRAVENOUS | Status: AC
  Administered 2015-05-22: 100 mL/h via INTRAVENOUS
  Filled 2015-05-22 (×2): qty 1000

## 2015-05-22 MED ORDER — AMLODIPINE BESYLATE 2.5 MG PO TABS
2.5000 mg | ORAL_TABLET | Freq: Every day | ORAL | Status: DC
  Administered 2015-05-23 – 2015-05-24 (×2): 2.5 mg via ORAL
  Filled 2015-05-22 (×2): qty 1

## 2015-05-22 MED ORDER — LEVOTHYROXINE SODIUM 50 MCG PO TABS
50.0000 ug | ORAL_TABLET | Freq: Every day | ORAL | Status: DC
  Administered 2015-05-23 – 2015-05-24 (×2): 50 ug via ORAL
  Filled 2015-05-22 (×3): qty 1

## 2015-05-22 MED ORDER — ACETAMINOPHEN 500 MG PO TABS
500.0000 mg | ORAL_TABLET | Freq: Four times a day (QID) | ORAL | Status: DC | PRN
  Filled 2015-05-22: qty 1

## 2015-05-22 MED ORDER — FUROSEMIDE 40 MG PO TABS
40.0000 mg | ORAL_TABLET | Freq: Every day | ORAL | Status: DC
Start: 2015-05-23 — End: 2015-05-24
  Administered 2015-05-23 – 2015-05-24 (×2): 40 mg via ORAL
  Filled 2015-05-22 (×2): qty 1

## 2015-05-22 MED ORDER — SODIUM CHLORIDE 0.9 % IV BOLUS (SEPSIS)
1000.0000 mL | Freq: Once | INTRAVENOUS | Status: AC
  Administered 2015-05-22: 1000 mL via INTRAVENOUS

## 2015-05-22 MED ORDER — PANTOPRAZOLE SODIUM 40 MG PO TBEC
40.0000 mg | DELAYED_RELEASE_TABLET | Freq: Every day | ORAL | Status: DC
  Administered 2015-05-23 – 2015-05-24 (×2): 40 mg via ORAL
  Filled 2015-05-22: qty 1

## 2015-05-22 MED ORDER — SODIUM CHLORIDE 0.9 % IV BOLUS (SEPSIS): 1000.0000 mL | Freq: Once | INTRAVENOUS | Status: DC

## 2015-05-22 MED ORDER — SODIUM CHLORIDE 0.9 % IJ SOLN
3.0000 mL | Freq: Two times a day (BID) | INTRAMUSCULAR | Status: DC
  Administered 2015-05-22 – 2015-05-24 (×3): 3 mL via INTRAVENOUS

## 2015-05-22 MED ORDER — ENOXAPARIN SODIUM 30 MG/0.3ML ~~LOC~~ SOLN
30.0000 mg | SUBCUTANEOUS | Status: DC
  Administered 2015-05-23: 30 mg via SUBCUTANEOUS
  Filled 2015-05-22 (×3): qty 0.3

## 2015-05-22 MED ORDER — ONDANSETRON HCL 4 MG/2ML IJ SOLN: 4.0000 mg | Freq: Four times a day (QID) | INTRAMUSCULAR | Status: DC | PRN

## 2015-05-22 NOTE — ED Notes (Signed)
Pt reports taking a 10 day dose of prednisone for neck pain. Pt states that she began having tightness and swelling sensation from "throat to stomach". Pt has reported a voice change and decreased appetite. Pt states that she completed the medication on Thursday but symptoms have continued.

## 2015-05-22 NOTE — H&P (Signed)
[facility] History and Physical  ARIANY Patel [id number] DOB: [birth year] DOA: 05/22/2015  Referring physician: Elnora Morrison, MD PCP: Dwan Bolt, MD   Chief Complaint: Neck pain, Throat and abdominal swelling.  HPI: Jennifer Patel is a 81 y.o. female with a past medical history hypertension, hyperlipidemia, hypothyroidism who comes to the emergency department due to above chief complaint. Per patient, she has had this neck pain for more than 6 months and came into the emergency department because she couldn't sleep well due to left-sided neck pain which radiates from her left-sided neck all the way down to her left elbow. He was recently seen at [facility] emergency department on the 21st of this month and has been taking the prednisone recently without any significant relief.   She also complains of throat discomfort that feels like swelling and has changed her voice. However she is not having any difficulty breathing. She has nonproductive cough, and denies fever, chills, earaches, rhinorrhea or sneezing.   Patient also had an episode of diarrhea in the morning, which has been followed by loss of appetite, mild abdominal pain and nausea without emesis since then. She states that she mostly gets constipated, but sometimes she gets diarrhea.   Review of Systems:  Constitutional:  No weight loss, night sweats, Fevers, chills, fatigue.  HEENT:  Left-sided temporal-occipital headaches radiating from neck,  Difficulty swallowing,Tooth/dental problems, Swollen throat sensation,  No sneezing, itching, ear ache, nasal congestion, post nasal drip,  Cardio-vascular:  No chest pain, Orthopnea, PND, swelling in lower extremities, anasarca, dizziness, palpitations  GI:  Positive mild abdominal pain, nausea, 1 episode diarrhea, loss of appetite  No heartburn, indigestion,  vomiting,  Resp:  No shortness of breath with exertion or at rest. No excess mucus, no productive  cough,  Positive non-productive cough,  No coughing up of blood.No change in color of mucus.No wheezing.No chest wall deformity  Skin:  no rash or lesions.  GU:  no dysuria, change in color of urine, no urgency or frequency. No flank pain.  Musculoskeletal:  No joint pain or swelling. No decreased range of motion. No back pain.  Psych:  No change in mood or affect. No depression or anxiety. No memory loss.   Past Medical History  Diagnosis Date  . Hypertension   . Thyroid disease   . High cholesterol    Past Surgical History  Procedure Laterality Date  . Abdominal hysterectomy    . Cholecystectomy     Social History:  reports that she has quit smoking. She has never used smokeless tobacco. She reports that she does not drink alcohol or use illicit drugs.  Allergies  Allergen Reactions  . Clarithromycin Swelling    Throat swelling  . Prednisone Swelling    Throat swelling and tightness from throat to stomach (05/22/15)    History reviewed. No pertinent family history.   Prior to Admission medications   Medication Sig Start Date End Date Taking? Authorizing Provider  acetaminophen (TYLENOL) 500 MG tablet Take 500 mg by mouth every 6 (six) hours as needed (pain).   Yes Historical Provider, MD  allopurinol (ZYLOPRIM) 100 MG tablet Take 200 mg by mouth daily.  06/12/14  Yes Historical Provider, MD  amLODipine-valsartan (EXFORGE) 5-160 MG per tablet Take 0.5 tablets by mouth daily.    Yes Historical Provider, MD  aspirin EC 81 MG tablet Take 81 mg by mouth daily.   Yes Historical Provider, MD  atorvastatin (LIPITOR) 10 MG tablet Take 10 mg  by mouth daily.   Yes Historical Provider, MD  Cyanocobalamin (VITAMIN B-12 IJ) Inject as directed every 30 (thirty) days. Next injection due 05/24/15 at Dr. Eugenio Hoes office   Yes Historical Provider, MD  furosemide (LASIX) 40 MG tablet Take 40 mg by mouth daily.   Yes Historical Provider, MD  lansoprazole (PREVACID) 15 MG capsule Take 15 mg by  mouth daily at 12 noon.   Yes Historical Provider, MD  levothyroxine (SYNTHROID, LEVOTHROID) 50 MCG tablet Take 50 mcg by mouth daily.   Yes Historical Provider, MD  Polyethyl Glycol-Propyl Glycol (SYSTANE OP) Place 1 drop into both eyes 2 (two) times daily as needed (dry eyes).   Yes Historical Provider, MD  naproxen (NAPROSYN) 250 MG tablet Take 1 tablet (250 mg total) by mouth 3 (three) times daily with meals. Patient not taking: Reported on 05/22/2015 02/05/14   Junius Creamer, NP  traMADol-acetaminophen (ULTRACET) 37.5-325 MG per tablet Take 1 tablet by mouth every 6 (six) hours as needed. Patient not taking: Reported on 05/22/2015 05/13/15   Daleen Bo, MD   Physical Exam: Filed Vitals:   05/22/15 1815 05/22/15 1830 05/22/15 1845 05/22/15 1900  BP: 118/53 128/53 145/87 141/64  Pulse: 92 89 92 90  Temp:      TempSrc:      Resp:      SpO2: 93% 93% 95% 94%    Wt Readings from Last 3 Encounters:  05/04/14 48.988 kg (108 lb)  03/06/14 49.125 kg (108 lb 4.8 oz)    General:  Appears calm and comfortable Eyes: PERRL, normal lids, irises & conjunctiva ENT: grossly normal hearing, lips & tongue Neck: no LAD, masses or thyromegaly Cardiovascular: RRR, no m/r/g. No LE edema. Telemetry: SR, no arrhythmias  Respiratory: CTA bilaterally, no w/r/r. Normal respiratory effort. Abdomen: soft, mild epigastric tenderness without guarding or rebound tenderness. Skin: no rash or induration seen on limited exam Musculoskeletal: grossly normal tone BUE/BLE Psychiatric: grossly normal mood and affect, speech fluent and appropriate Neurologic: grossly non-focal.          Labs on Admission:  Basic Metabolic Panel:  Recent Labs Lab 05/22/15 1643  NA 138  K 3.3*  CL 99*  GLUCOSE 93  BUN 38*  CREATININE 1.70*   Liver Function Tests: No results for input(s): AST, ALT, ALKPHOS, BILITOT, PROT, ALBUMIN in the last 168 hours. No results for input(s): LIPASE, AMYLASE in the last 168 hours. No  results for input(s): AMMONIA in the last 168 hours. CBC:  Recent Labs Lab 05/22/15 1632 05/22/15 1643  WBC 9.4  --   NEUTROABS 7.8*  --   HGB 12.1 13.6  HCT 35.5* 40.0  MCV 92.9  --   PLT 230  --    Cardiac Enzymes: No results for input(s): CKTOTAL, CKMB, CKMBINDEX, TROPONINI in the last 168 hours.  BNP (last 3 results) No results for input(s): BNP in the last 8760 hours.  ProBNP (last 3 results) No results for input(s): PROBNP in the last 8760 hours.  CBG: No results for input(s): GLUCAP in the last 168 hours.  Radiological Exams on Admission: Dg Chest 2 View  05/22/2015   CLINICAL DATA:  Chest and throat tightness. Patient says her voice has changed.  EXAM: CHEST  2 VIEW  COMPARISON:  CT 01/04/2012  FINDINGS: There is moderate hyperinflation. There is a hiatal hernia. Heart size is normal. The lungs are clear. Nodular appearing opacity in the left lung is probable due to superimposition of bony structures, likely with a calloused  rib fracture. Pulmonary vasculature is normal. There is no effusion.  IMPRESSION: Hiatal hernia. Hyperinflation. No acute cardiopulmonary findings. Probable benign bony sclerosis accounting for the nodular opacity in the lateral left lung, but it would be prudent to obtain a follow-up radiograph to re-evaluate and confirm.   Electronically Signed   By: Andreas Newport M.D.   On: 05/22/2015 17:21   Ct Soft Tissue Neck Wo Contrast  05/22/2015   CLINICAL DATA:  Neck pain since 05/13/2015. Given prednisone and today started having tightness and swelling in throat. Neck pain persists. Chronic kidney problems.  EXAM: CT NECK WITHOUT CONTRAST  TECHNIQUE: Multidetector CT imaging of the neck was performed following the standard protocol without intravenous contrast.  COMPARISON:  Head CT 06/23/2008 and cervical spine CT 12/24/2010  FINDINGS: Examination demonstrates the spaces of the suprahyoid neck to be within normal without focal mass, adenopathy, fluid  collection or inflammatory change. Airway is patent. Epiglottis and subglottic airway are normal. No upper teeth are present. Several dental caries over the visualized lower teeth. No evidence of periodontal disease.  Visualized orbits and paranasal sinuses are within normal. Mastoid air cells are clear. Salivary glands are normal and symmetric.  Infrahyoid neck demonstrates minimal thyroid tissue as otherwise within normal. There is moderate calcified plaque over the carotid bifurcations bilaterally. Lung apices are normal. There is mild spondylosis of the cervical spine.  IMPRESSION: No acute findings.   Electronically Signed   By: Marin Olp M.D.   On: 05/22/2015 17:51    EKG: Independently reviewed. Vent. rate 90 BPM PR interval 98 ms QRS duration 84 ms QT/QTc 320/391 ms P-R-T axes 0 38 236 Sinus rhythm Short PR interval Repol abnrm suggests ischemia, inferior leads  Assessment/Plan Principal Problem:   Abnormal EKG Active Problems:   AKI (acute kidney injury)   Hypertension   Hypothyroidism   Hyperlipidemia   Neck pain  Symptoms are atypical, but given her age and EKG changes Will admit to continue telemetry monitoring, to troponin trending and get an echocardiogram. The patient hasn't been eating and drinking like usual for the past few days, so will hydrate and recheck BUN/creatinine in the morning. Continue regular home meds. Monitor blood pressure. Analgesics as needed.  Code Status: Full code. DVT Prophylaxis: Lovenox SQ. Family Communication:  Elberta Spaniel [phone] Cell  Disposition Plan: Home with outpatient follow-up.  Time spent: Over 70 minutes.  Reubin Milan [facility] Pager [phone]

## 2015-05-22 NOTE — ED Provider Notes (Signed)
History   Chief Complaint  Patient presents with  . Medication Reaction    HPI  Jennifer Patel is a 81 y.o. female with PMH as below notable for HTN, hypothyroid who presents to ED with c/o throat swelling, abdominal swelling.  Patient was recently seen at [facility] long for her chronic neck pain and cervical radiculopathy and was prescribed prednisone at that time. Patient states she has taken 40 mg daily for the past 5 days and noted today she began having a sensation that her throat was swelling and her abdomen was swelling as well. She says she's been mildly nauseous during this time. She denies any chest pain, shortness of breath. She has had 3 days of mild productive cough as well. Denies any fevers or chills. She denies having history of similar symptoms in the past. She has not had any rashes. She denies history of allergies with symptoms such as this in the past as well. Her daughter accompanies her today and says that her voice seems different than normal. Patient says she is having trouble with her saliva. Daughter notes her voice is softer than normal. Patient denies having any weakness, numbness, tingling, history of CVA/TIA. Patient denies headache, vision changes. Once it was gradual. No modifying factors. Severity is mild to moderate.    Past medical/surgical history, social history, medications, allergies and FH have been reviewed with patient and/or in documentation. Furthermore, if pt family or friend(s) present, additional historical information was obtained from them.  Past Medical History  Diagnosis Date  . Hypertension   . Thyroid disease   . High cholesterol    Past Surgical History  Procedure Laterality Date  . Abdominal hysterectomy    . Cholecystectomy     No family history on file. History  Substance Use Topics  . Smoking status: Former Research scientist (life sciences)  . Smokeless tobacco: Never Used  . Alcohol Use: No     Review of Systems Constitutional: - F/C, -fatigue.   HENT: - congestion, -rhinorrhea, + throat swelling.   Eyes: - eye pain, -visual disturbance.  Respiratory: + cough, -SOB, -hemoptysis.   Cardiovascular: - CP, -palps.  Gastrointestinal: + nausea, - vomiting/diarrhea, + abd swelling Genitourinary: - flank pain, -dysuria, -frequency.  Musculoskeletal: - myalgia/arthritis, -joint swelling, -gait abnormality, -back pain, -neck pain/stiffness, -leg pain/swelling.  Skin: - rash/lesion.  Neurological: - focal weakness, -lightheadedness, -dizziness, -numbness, -HA.  All other systems reviewed and are negative.   Physical Exam  Physical Exam  ED Triage Vitals  Enc Vitals Group     BP 05/22/15 1338 136/55 mmHg     Pulse Rate 05/22/15 1338 109     Resp --      Temp 05/22/15 1338 97.7 F (36.5 C)     Temp Source 05/22/15 1338 Oral     SpO2 05/22/15 1338 97 %     Weight --      Height --      Head Cir --      Peak Flow --      Pain Score 05/22/15 1344 10     Pain Loc --      Pain Edu? --      Excl. in [place]? --    Constitutional: Patient is chronically ill elderly appearing fem and in no acute distress Head: Normocephalic and atraumatic.  Eyes: Extraocular motion intact, no scleral icterus Mouth: MMM, OP clear no edema, uvula midline, no PTA . Saliva is pooling in her mouth. She is able to clear this with  swallowing. Neck: Supple without meningismus, mass, or overt JVD Respiratory: No respiratory distress. Normal WOB. No w/r/g. No stridor. CV: RRR, no obvious murmurs.  Pulses +2 and symmetric. Euvolemic Abdomen: Soft, NT, ND, no r/g. No mass.  MSK: Extremities are atraumatic without deformity, ROM intact Skin: Warm, dry, intact without rash Neuro: AAOx4, MAE 5/5 sym, no focal deficit noted   ED Course  Procedures   Labs Reviewed  CBC WITH DIFFERENTIAL/PLATELET - Abnormal; Notable for the following:    RBC 3.82 (*)    HCT 35.5 (*)    Neutrophils Relative % 83 (*)    Neutro Abs 7.8 (*)    Lymphocytes Relative 9 (*)    All  other components within normal limits  I-STAT CHEM 8, ED - Abnormal; Notable for the following:    Potassium 3.3 (*)    Chloride 99 (*)    BUN 38 (*)    Creatinine, Ser 1.70 (*)    Calcium, Ion 1.07 (*)    All other components within normal limits  TROPONIN I  TROPONIN I  COMPREHENSIVE METABOLIC PANEL  CBC  I-STAT TROPOININ, ED  I-STAT TROPOININ, ED   I personally reviewed and interpreted all labs.  CT Soft Tissue Neck Wo Contrast  Final Result    DG Chest 2 View  Final Result     I personally viewed above image(s) which were used in my medical decision making. Formal interpretations by Radiology.  MDM: [name] is a 81 y.o. female with H&P as above who p/w CC: Throat swelling, abdominal swelling. -Pertinent historical findings: Patient recently seen at [facility] long and prescribed prednisone for her cervical radiculopathy and degenerative disc disease. -Initial impression: On arrival, patient is hemodynamically stable and in no apparent distress. She is protecting her airway. Abdomen is benign. Patient does not appear to be having anaphylaxis. Benign neuro exam and clinical picture does not suggest neurologic etiology. Given age, fem, query possible cardiac etiology.  -Ordered: EKG, CT neck, chest x-ray, screening labs  -Results: EKG shows scalloping of ST segments in inf and lat lead and approx 1 mm STD in septal leads that appears new from prior. Labs notable for worsening Cr to 1.7 from 1.2 baseline and are o/w unremarkable; Tn normal. -Re-evaluation: pt remains HDS, NAD.  Given vague sxs, AKI and new EKG changes, will admit for further eval. Old records reviewed (if available). Labs and imaging reviewed personally by myself and considered in medical decision making if ordered. Clinical Impression: 1. AKI (acute kidney injury)   2. Abnormal EKG   3. Throat swelling     Disposition: admit  Condition: Good  I have discussed the results, Dx and Tx plan with the pt(&  family if present). He/she/they expressed understanding and agree(s) with the plan.  Pt seen in conjunction with Dr. Gaye Pollack, [place] [facility] Resident - PGY-3      Kirstie Peri, MD 05/23/15 [id number]  Elnora Morrison, MD 05/25/15 [phone]

## 2015-05-22 NOTE — ED Notes (Signed)
Attempted report 

## 2015-05-23 ENCOUNTER — Other Ambulatory Visit (HOSPITAL_COMMUNITY): Payer: Self-pay

## 2015-05-23 DIAGNOSIS — M542 Cervicalgia: Secondary | ICD-10-CM

## 2015-05-23 DIAGNOSIS — N179 Acute kidney failure, unspecified: Secondary | ICD-10-CM | POA: Diagnosis not present

## 2015-05-23 DIAGNOSIS — E039 Hypothyroidism, unspecified: Secondary | ICD-10-CM

## 2015-05-23 DIAGNOSIS — I10 Essential (primary) hypertension: Secondary | ICD-10-CM | POA: Diagnosis not present

## 2015-05-23 LAB — COMPREHENSIVE METABOLIC PANEL
ALT: 16 U/L (ref 14–54)
AST: 21 U/L (ref 15–41)
Albumin: 2.9 g/dL — ABNORMAL LOW (ref 3.5–5.0)
Alkaline Phosphatase: 65 U/L (ref 38–126)
Anion gap: 11 (ref 5–15)
BILIRUBIN TOTAL: 1.1 mg/dL (ref 0.3–1.2)
BUN: 29 mg/dL — ABNORMAL HIGH (ref 6–20)
CO2: 22 mmol/L (ref 22–32)
Calcium: 8.3 mg/dL — ABNORMAL LOW (ref 8.9–10.3)
Chloride: 109 mmol/L (ref 101–111)
Creatinine, Ser: 1.46 mg/dL — ABNORMAL HIGH (ref 0.44–1.00)
GFR calc Af Amer: 38 mL/min — ABNORMAL LOW (ref >60)
GFR, EST NON AFRICAN AMERICAN: 33 mL/min — AB (ref >60)
GLUCOSE: 69 mg/dL (ref 65–99)
POTASSIUM: 3.9 mmol/L (ref 3.5–5.1)
SODIUM: 142 mmol/L (ref 135–145)
Total Protein: 5.6 g/dL — ABNORMAL LOW (ref 6.5–8.1)

## 2015-05-23 LAB — CBC
HEMATOCRIT: 32.4 % — AB (ref 36.0–46.0)
Hemoglobin: 10.7 g/dL — ABNORMAL LOW (ref 12.0–15.0)
MCH: 31.4 pg (ref 26.0–34.0)
MCHC: 33 g/dL (ref 30.0–36.0)
MCV: 95 fL (ref 78.0–100.0)
Platelets: 195 10*3/uL (ref 150–400)
RBC: 3.41 MIL/uL — ABNORMAL LOW (ref 3.87–5.11)
RDW: 15 % (ref 11.5–15.5)
WBC: 7.4 10*3/uL (ref 4.0–10.5)

## 2015-05-23 LAB — TROPONIN I
Troponin I: <0.03 ng/mL (ref <0.031)
Troponin I: <0.03 ng/mL (ref <0.031)

## 2015-05-23 MED ORDER — LOPERAMIDE HCL 2 MG PO CAPS: 2.0000 mg | ORAL_CAPSULE | Freq: Once | ORAL | Status: DC

## 2015-05-23 MED ORDER — POTASSIUM CHLORIDE IN NACL 40-0.9 MEQ/L-% IV SOLN
INTRAVENOUS | Status: AC
  Administered 2015-05-23: 10 mL/h via INTRAVENOUS
  Filled 2015-05-23: qty 1000

## 2015-05-23 NOTE — Progress Notes (Addendum)
[facility] PROGRESS NOTE  DEVENA ARCENEAUX [id number] DOB: [birth year] DOA: 05/22/2015  PCP: Dwan Bolt, MD  Brief HPI: 81 year old Caucasian female with a past medical history of hypertension, hyperlipidemia, presented with complaints of neck pain, throat swelling and abdominal swelling with a few episodes of loose stools. She was noted to have elevated BUN and creatinine. She was brought into the hospital for further management.  Past medical history:  Past Medical History  Diagnosis Date  . Hypertension   . Thyroid disease   . High cholesterol     Consultants: None  Procedures: None  Antibiotics: None  Subjective: Patient feels better this morning. Neck pain is improved. Does not feel as if her throat is swollen anymore. Abdomen feels better as well. She did have an episode of loose stool this morning. Denies any abdominal pain. Denies nausea or vomiting.  Objective: Vital Signs  Filed Vitals:   05/22/15 1930 05/22/15 1945 05/22/15 2025 05/23/15 0459  BP: 162/64 152/51 169/67 135/48  Pulse: 83 84 90 80  Temp:   98.6 F (37 C) 98.4 F (36.9 C)  TempSrc:   Oral Oral  Resp:   18 18  Height:   4\' 8"  (1.422 m)   Weight:   46.993 kg (103 lb 9.6 oz)   SpO2: 96% 97% 97% 98%    Intake/Output Summary (Last 24 hours) at 05/23/15 1247 Last data filed at 05/23/15 0900  Gross per 24 hour  Intake    600 ml  Output      0 ml  Net    600 ml   Filed Weights   05/22/15 2025  Weight: 46.993 kg (103 lb 9.6 oz)    General appearance: alert, cooperative, appears stated age and no distress Resp: clear to auscultation bilaterally Cardio: regular rate and rhythm, S1, S2 normal, no murmur, click, rub or gallop GI: soft, non-tender; bowel sounds normal; no masses,  no organomegaly Extremities: extremities normal, atraumatic, no cyanosis or edema Neurologic: No focal deficits.  Lab Results:  Basic Metabolic Panel:  Recent Labs Lab 05/22/15 1643  05/23/15 0513  NA 138 142  K 3.3* 3.9  CL 99* 109  CO2  --  22  GLUCOSE 93 69  BUN 38* 29*  CREATININE 1.70* 1.46*  CALCIUM  --  8.3*   Liver Function Tests:  Recent Labs Lab 05/23/15 0513  AST 21  ALT 16  ALKPHOS 65  BILITOT 1.1  PROT 5.6*  ALBUMIN 2.9*   CBC:  Recent Labs Lab 05/22/15 1632 05/22/15 1643 05/23/15 0513  WBC 9.4  --  7.4  NEUTROABS 7.8*  --   --   HGB 12.1 13.6 10.7*  HCT 35.5* 40.0 32.4*  MCV 92.9  --  95.0  PLT 230  --  195   Cardiac Enzymes:  Recent Labs Lab 05/22/15 2326 05/23/15 0513  TROPONINI <0.03 <0.03     Studies/Results: Dg Chest 2 View  05/22/2015   CLINICAL DATA:  Chest and throat tightness. Patient says her voice has changed.  EXAM: CHEST  2 VIEW  COMPARISON:  CT 01/04/2012  FINDINGS: There is moderate hyperinflation. There is a hiatal hernia. Heart size is normal. The lungs are clear. Nodular appearing opacity in the left lung is probable due to superimposition of bony structures, likely with a calloused rib fracture. Pulmonary vasculature is normal. There is no effusion.  IMPRESSION: Hiatal hernia. Hyperinflation. No acute cardiopulmonary findings. Probable benign bony sclerosis accounting for the nodular opacity in the lateral  left lung, but it would be prudent to obtain a follow-up radiograph to re-evaluate and confirm.   Electronically Signed   By: Andreas Newport M.D.   On: 05/22/2015 17:21   Ct Soft Tissue Neck Wo Contrast  05/22/2015   CLINICAL DATA:  Neck pain since 05/13/2015. Given prednisone and today started having tightness and swelling in throat. Neck pain persists. Chronic kidney problems.  EXAM: CT NECK WITHOUT CONTRAST  TECHNIQUE: Multidetector CT imaging of the neck was performed following the standard protocol without intravenous contrast.  COMPARISON:  Head CT 06/23/2008 and cervical spine CT 12/24/2010  FINDINGS: Examination demonstrates the spaces of the suprahyoid neck to be within normal without focal mass,  adenopathy, fluid collection or inflammatory change. Airway is patent. Epiglottis and subglottic airway are normal. No upper teeth are present. Several dental caries over the visualized lower teeth. No evidence of periodontal disease.  Visualized orbits and paranasal sinuses are within normal. Mastoid air cells are clear. Salivary glands are normal and symmetric.  Infrahyoid neck demonstrates minimal thyroid tissue as otherwise within normal. There is moderate calcified plaque over the carotid bifurcations bilaterally. Lung apices are normal. There is mild spondylosis of the cervical spine.  IMPRESSION: No acute findings.   Electronically Signed   By: Marin Olp M.D.   On: 05/22/2015 17:51    Medications:  Scheduled: . allopurinol  200 mg Oral Daily  . amLODipine  2.5 mg Oral Daily   And  . irbesartan  75 mg Oral Daily  . aspirin EC  81 mg Oral Daily  . atorvastatin  10 mg Oral Daily  . enoxaparin (LOVENOX) injection  30 mg Subcutaneous Q24H  . furosemide  40 mg Oral Daily  . levothyroxine  50 mcg Oral QAC breakfast  . loperamide  2 mg Oral Once  . pantoprazole  40 mg Oral Daily  . sodium chloride  3 mL Intravenous Q12H   Continuous: . 0.9 % NaCl with KCl 40 mEq / L     [id number], ALPRAZolam, ondansetron (ZOFRAN) IV  Assessment/Plan:  Principal Problem:   Abnormal EKG Active Problems:   AKI (acute kidney injury)   Hypertension   Hypothyroidism   Hyperlipidemia   Neck pain    Mild acute renal failure Improved with IV fluids. Monitor urine output. Repeat labs in the morning. This was likely prerenal. It is noted that her ARB and diuretics were not held yesterday. Since there is improvement in her renal function we will continue for now.  Abdominal discomfort with loose stools Etiology is unclear. Could be viral gastroenteritis. Abdomen is completely benign. Give her Imodium. Could be responsible for the above. No need for imaging studies.  Neck pain in throat  swelling Neck pain most likely musculoskeletal. Improved this morning. CT of the neck was unremarkable. She feels better. Await PT and OT evaluation. She was noted to have abnormal EKG yesterday. She denies any chest pain. Troponins are normal.  Abnormal chest x-ray Will need repeat films in a few weeks.  History of essential hypertension Continue home medications. Blood pressure is stable.  History of hypothyroidism Continue Synthroid  Normocytic anemia No overt bleeding. Monitor hemoglobin closely.  DVT Prophylaxis: Lovenox    Code Status: Full code  Family Communication: Discussed with the patient  Disposition Plan: Start mobilizing. PT and OT to evaluate. Anticipate discharge tomorrow.      [facility]  [facility] Pager [phone] 05/23/2015, 12:47 PM  If 7PM-7AM, please contact night-coverage at [email or web address], password [facility]

## 2015-05-23 NOTE — Evaluation (Signed)
Physical Therapy Evaluation Patient Details Name: Jennifer Patel MRN: [id number] DOB: January 23, 1932 Today's Date: 05/23/2015   History of Present Illness  81 year old Caucasian female with a past medical history of hypertension, hyperlipidemia, presented with complaints of neck pain, throat swelling and abdominal swelling with a few episodes of loose stools. She was noted to have elevated BUN and creatinine. CT of neck negative  Clinical Impression  Pt very pleasant with report of improved neck pain and ROM. Pt without radiating pain or decreased ROM neck Right vs left. Pt with decreased balance and gait with improved function with RW. Pt also with decreased bil LE strength with HEP performed and pt educated for continued gait and HEP to maximize strength and balance. Pt will benefit from acute therapy to increase independence, strength and gait for safe D/C home.     Follow Up Recommendations Home health PT    Equipment Recommendations  Rolling walker with 5" wheels (youth)    Recommendations for Other Services       Precautions / Restrictions Precautions Precautions: Fall      Mobility  Bed Mobility Overal bed mobility: Modified Independent                Transfers Overall transfer level: Modified independent                  Ambulation/Gait Ambulation/Gait assistance: Supervision Ambulation Distance (Feet): 350 Feet Assistive device: Rolling walker (2 wheeled);None Gait Pattern/deviations: Step-through pattern;Decreased stride length;Trunk flexed   Gait velocity interpretation: at or above normal speed for age/gender General Gait Details: pt with slow very cautious gait without RW with report of decreased speed. pt with RW with increased stride, able to turn head and overall increased stability with cues for position in RW  Stairs            Wheelchair Mobility    Modified Rankin (Stroke Patients Only)       Balance Overall balance assessment:  Needs assistance   Sitting balance-Leahy Scale: Good       Standing balance-Leahy Scale: Fair                               Pertinent Vitals/Pain Pain Assessment: No/denies pain    Home Living Family/patient expects to be discharged to:: Private residence Living Arrangements: Alone Available Help at Discharge: Family;Available PRN/intermittently Type of Home: Independent living facility Home Access: Level entry     Home Layout: One level Home Equipment: None      Prior Function Level of Independence: Independent         Comments: pt states she walks on her own, no falls, doesn't drive and participates in activities at the center     Hand Dominance        Extremity/Trunk Assessment   Upper Extremity Assessment: Generalized weakness           Lower Extremity Assessment: Generalized weakness      Cervical / Trunk Assessment: Kyphotic (forward head)  Communication   Communication: No difficulties  Cognition Arousal/Alertness: Awake/alert Behavior During Therapy: WFL for tasks assessed/performed Overall Cognitive Status: Within Functional Limits for tasks assessed                      General Comments      Exercises General Exercises - Lower Extremity Ankle Circles/Pumps: AROM;Seated;Both;15 reps Long Arc Quad: AROM;Seated;Both;15 reps Hip Flexion/Marching: AROM;Seated;Both;15 reps  Assessment/Plan    PT Assessment Patient needs continued PT services  PT Diagnosis Difficulty walking;Generalized weakness   PT Problem List Decreased strength;Decreased activity tolerance;Decreased balance;Decreased mobility;Decreased knowledge of use of DME  PT Treatment Interventions Gait training;DME instruction;Functional mobility training;Therapeutic activities;Therapeutic exercise;Balance training;Patient/family education   PT Goals (Current goals can be found in the Care Plan section) Acute Rehab PT Goals Patient Stated Goal: return  home PT Goal Formulation: With patient/family Time For Goal Achievement: 05/30/15 Potential to Achieve Goals: Good    Frequency Min 3X/week   Barriers to discharge Decreased caregiver support      Co-evaluation               End of Session   Activity Tolerance: Patient tolerated treatment well Patient left: in chair;with call bell/phone within reach;with family/visitor present Nurse Communication: Mobility status    Functional Assessment Tool Used: clinical judgement Functional Limitation: Mobility: Walking and moving around Mobility: Walking and Moving Around Current Status [id number]): At least 1 percent but less than 20 percent impaired, limited or restricted Mobility: Walking and Moving Around Goal Status [phone]): At least 1 percent but less than 20 percent impaired, limited or restricted    Time: [id number] PT Time Calculation (min) (ACUTE ONLY): 15 min   Charges:   PT Evaluation $Initial PT Evaluation Tier I: 1 Procedure     PT G Codes:   PT G-Codes **NOT FOR INPATIENT CLASS** Functional Assessment Tool Used: clinical judgement Functional Limitation: Mobility: Walking and moving around Mobility: Walking and Moving Around Current Status [id number]): At least 1 percent but less than 20 percent impaired, limited or restricted Mobility: Walking and Moving Around Goal Status [phone]): At least 1 percent but less than 20 percent impaired, limited or restricted    Melford Aase 05/23/2015, 2:19 PM  Elwyn Reach, [place]

## 2015-05-24 ENCOUNTER — Observation Stay (HOSPITAL_COMMUNITY): Payer: PPO

## 2015-05-24 ENCOUNTER — Encounter (HOSPITAL_COMMUNITY): Payer: Self-pay | Admitting: Cardiology

## 2015-05-24 DIAGNOSIS — N179 Acute kidney failure, unspecified: Secondary | ICD-10-CM | POA: Diagnosis not present

## 2015-05-24 LAB — CBC
HCT: 32.3 % — ABNORMAL LOW (ref 36.0–46.0)
HEMOGLOBIN: 10.6 g/dL — AB (ref 12.0–15.0)
MCH: 30.7 pg (ref 26.0–34.0)
MCHC: 32.8 g/dL (ref 30.0–36.0)
MCV: 93.6 fL (ref 78.0–100.0)
Platelets: 180 10*3/uL (ref 150–400)
RBC: 3.45 MIL/uL — ABNORMAL LOW (ref 3.87–5.11)
RDW: 14.9 % (ref 11.5–15.5)
WBC: 7.4 10*3/uL (ref 4.0–10.5)

## 2015-05-24 LAB — BASIC METABOLIC PANEL
Anion gap: 5 (ref 5–15)
BUN: 29 mg/dL — ABNORMAL HIGH (ref 6–20)
CALCIUM: 8.4 mg/dL — AB (ref 8.9–10.3)
CHLORIDE: 107 mmol/L (ref 101–111)
CO2: 25 mmol/L (ref 22–32)
Creatinine, Ser: 1.22 mg/dL — ABNORMAL HIGH (ref 0.44–1.00)
GFR calc Af Amer: 47 mL/min — ABNORMAL LOW (ref >60)
GFR calc non Af Amer: 41 mL/min — ABNORMAL LOW (ref >60)
GLUCOSE: 97 mg/dL (ref 65–99)
Potassium: 3.7 mmol/L (ref 3.5–5.1)
Sodium: 137 mmol/L (ref 135–145)

## 2015-05-24 NOTE — Care Management Note (Addendum)
Case Management Note  Patient Details  Name: Jennifer Patel MRN: [id number] Date of Birth: [birth year]  Subjective/Objective:   Pt admitted with abnormal EKG                 Action/Plan:  Pt is from home independent.  Pt's daughter lives in [place] but can quickly get to pt if needed for assistance.  CM will monitor for disposition needs.   Expected Discharge Date:                  Expected Discharge Plan:  Home/Self Care  In-House Referral:     Discharge planning Services  CM Consult  Post Acute Care Choice:    Choice offered to:  Patient  DME Arranged:  Walker rolling DME Agency:  [place] Arranged:  RN, PT, OT [facility] Agency:  [facility]  Status of Service:  Completed, signed off  Medicare Important Message Given:  No Date Medicare IM Given:    Medicare IM give by:    Date Additional Medicare IM Given:    Additional Medicare Important Message give by:     If discussed at [place] of Stay Meetings, dates discussed:    Additional Comments: CM was contacted by [facility], agency unable to service pt due to PCP constraints with agency. CM contacted [facility] (only other agency that accepts insurance), CM spoke with Tim, referral accepted.  CM assessed pt, daughter at bedside.  Daughter will transport pt back home at discharge. CM offered pt choice, pt chose [facility], both DME and [facility] agency contacted, referrals accepted.  CM verified address and phone numbers against Epic. [name], RN 05/24/2015, 10:29 AM

## 2015-05-24 NOTE — Progress Notes (Signed)
Physical Therapy Treatment Patient Details Name: Jennifer Patel MRN: [id number] DOB: 1931-11-13 Today's Date: 05/24/2015    History of Present Illness 81 year old Caucasian female with a past medical history of hypertension, hyperlipidemia, presented with complaints of neck pain, throat swelling and abdominal swelling with a few episodes of loose stools. She was noted to have elevated BUN and creatinine. CT of neck negative    PT Comments    Pt progressing very well with all aspects of mobility and balance.  She was able to ambulate today without use of RW at S level.  Pt with mild drifting to the R with increased balance challenge, however no overt LOB.  Discussed that PT will continue to order RW for pt in case of need in future and will also continue to recommend HHPT for follow up.  Pt verbalized understanding.   Follow Up Recommendations  Home health PT     Equipment Recommendations  Other (comment);Rolling walker with 5" wheels (youth RW)    Recommendations for Other Services       Precautions / Restrictions Precautions Precautions: Fall Precaution Comments: HR up to 130's with gait Restrictions Weight Bearing Restrictions: No    Mobility  Bed Mobility Overal bed mobility:  (Pt standing with nurse tech when PT arrived. )                Transfers Overall transfer level: Modified independent Equipment used: None                Ambulation/Gait Ambulation/Gait assistance: Supervision Ambulation Distance (Feet): 350 Feet Assistive device: None Gait Pattern/deviations: Step-through pattern;Drifts right/left Gait velocity: improved from last visit.   General Gait Details: Pt stating wanting to ambulate without AD today as she was feeling much better.  Pt able to ambulate around unit >300' without AD at S level.  Had pt perform head turns and change in gait speed, sudden stops, etc with no overt LOB, however pt does tend to drift to the R with task.      Stairs            Wheelchair Mobility    Modified Rankin (Stroke Patients Only)       Balance Overall balance assessment: Needs assistance Sitting-balance support: Feet supported Sitting balance-Leahy Scale: Good     Standing balance support: During functional activity;No upper extremity supported Standing balance-Leahy Scale: Good                 High Level Balance Comments: Again, performed gait with head turns horizontally, vertically, as well as gait speed changes with sudden stops.  No overt LOB, but did note that pt tends to drift to the R.  Discussed making home environment safe as well as keeping cell phone on her (which she typically does).     Cognition Arousal/Alertness: Awake/alert Behavior During Therapy: WFL for tasks assessed/performed Overall Cognitive Status: Within Functional Limits for tasks assessed                      Exercises General Exercises - Lower Extremity Ankle Circles/Pumps: AROM;Seated;Both;15 reps Long Arc Quad: AROM;Seated;Both;15 reps Hip Flexion/Marching: AROM;Seated;Both;15 reps    General Comments        Pertinent Vitals/Pain Pain Assessment: No/denies pain    Home Living                      Prior Function            PT Goals (  current goals can now be found in the care plan section) Acute Rehab PT Goals Patient Stated Goal: return home PT Goal Formulation: With patient/family Time For Goal Achievement: 05/30/15 Potential to Achieve Goals: Good Progress towards PT goals: Progressing toward goals    Frequency  Min 3X/week    PT Plan Current plan remains appropriate    Co-evaluation             End of Session   Activity Tolerance: Patient tolerated treatment well Patient left: in chair;with call bell/phone within reach;with family/visitor present     Time: 0849-0908 PT Time Calculation (min) (ACUTE ONLY): 19 min  Charges:  $Gait Training: 8-22 mins                    G  Codes:  Functional Assessment Tool Used: clinical judgement Functional Limitation: Mobility: Walking and moving around Mobility: Walking and Moving Around Current Status [phone]): At least 1 percent but less than 20 percent impaired, limited or restricted Mobility: Walking and Moving Around Goal Status [phone]): 0 percent impaired, limited or restricted   Denice Bors 05/24/2015, 9:18 AM

## 2015-05-24 NOTE — Discharge Summary (Signed)
[facility]  Physician Discharge Summary   Patient ID: Jennifer Patel MRN: [id number] DOB/AGE: 79/22/1933 79 y.o.  Admit date: 05/22/2015 Discharge date: 05/24/2015  PCP: Dwan Bolt, MD  DISCHARGE DIAGNOSES:  Principal Problem:   Abnormal EKG Active Problems:   AKI (acute kidney injury)   Hypertension   Hypothyroidism   Hyperlipidemia   Neck pain   RECOMMENDATIONS FOR OUTPATIENT FOLLOW UP: 1. Home health has been ordered 2. Recommend repeat chest x-ray in 3-4 weeks regarding nonspecific nodular finding. Please see her chest x-ray report below.   DISCHARGE CONDITION: fair  Diet recommendation: Heart healthy  Filed Weights   05/22/15 2025  Weight: 46.993 kg (103 lb 9.6 oz)    INITIAL HISTORY: 81 year old Caucasian female with a past medical history of hypertension, hyperlipidemia, presented with complaints of neck pain, throat swelling and abdominal swelling with a few episodes of loose stools. She was noted to have elevated BUN and creatinine. She was brought into the hospital for further management.   HOSPITAL COURSE:   Mild acute renal failure Patient was given IV fluids. Her renal function improved. This was thought to be prerenal in etiology.   Abdominal discomfort with loose stools Etiology is unclear. Could have been viral gastroenteritis. Abdomen is completely benign. She was given Imodium with cessation of diarrhea. Tolerating her diet.   Neck pain in throat swelling Neck pain is most likely musculoskeletal. Has improved. CT of the neck was unremarkable. She was noted to have abnormal EKG with T inversion in lead 3. She never had any chest pain. Troponins are normal. Symptoms are unlikely to be cardiac in etiology. Follow-up with outpatient providers.  Abnormal chest x-ray with question raised about nodular lesion  Will need repeat films in a few weeks.  History of essential hypertension Continue home medications. Blood pressure is  stable. She may resume her diuretics.  History of hypothyroidism Continue Synthroid  Normocytic anemia No overt bleeding. Hemoglobin stable  Overall much improved. She wants to go home. She feels better. Stable for discharge. He was ambulated by PT and OT who recommended home health. This will be ordered.   PERTINENT LABS:  The results of significant diagnostics from this hospitalization (including imaging, microbiology, ancillary and laboratory) are listed below for reference.     Labs: Basic Metabolic Panel:  Recent Labs Lab 05/22/15 1643 05/23/15 0513 05/24/15 0546  NA 138 142 137  K 3.3* 3.9 3.7  CL 99* 109 107  CO2  --  22 25  GLUCOSE 93 69 97  BUN 38* 29* 29*  CREATININE 1.70* 1.46* 1.22*  CALCIUM  --  8.3* 8.4*   Liver Function Tests:  Recent Labs Lab 05/23/15 0513  AST 21  ALT 16  ALKPHOS 65  BILITOT 1.1  PROT 5.6*  ALBUMIN 2.9*   CBC:  Recent Labs Lab 05/22/15 1632 05/22/15 1643 05/23/15 0513 05/24/15 0546  WBC 9.4  --  7.4 7.4  NEUTROABS 7.8*  --   --   --   HGB 12.1 13.6 10.7* 10.6*  HCT 35.5* 40.0 32.4* 32.3*  MCV 92.9  --  95.0 93.6  PLT 230  --  195 180   Cardiac Enzymes:  Recent Labs Lab 05/22/15 2326 05/23/15 0513  TROPONINI <0.03 <0.03    IMAGING STUDIES Dg Chest 2 View  05/22/2015   CLINICAL DATA:  Chest and throat tightness. Patient says her voice has changed.  EXAM: CHEST  2 VIEW  COMPARISON:  CT 01/04/2012  FINDINGS: There is moderate  hyperinflation. There is a hiatal hernia. Heart size is normal. The lungs are clear. Nodular appearing opacity in the left lung is probable due to superimposition of bony structures, likely with a calloused rib fracture. Pulmonary vasculature is normal. There is no effusion.  IMPRESSION: Hiatal hernia. Hyperinflation. No acute cardiopulmonary findings. Probable benign bony sclerosis accounting for the nodular opacity in the lateral left lung, but it would be prudent to obtain a follow-up  radiograph to re-evaluate and confirm.   Electronically Signed   By: Andreas Newport M.D.   On: 05/22/2015 17:21   Ct Soft Tissue Neck Wo Contrast  05/22/2015   CLINICAL DATA:  Neck pain since 05/13/2015. Given prednisone and today started having tightness and swelling in throat. Neck pain persists. Chronic kidney problems.  EXAM: CT NECK WITHOUT CONTRAST  TECHNIQUE: Multidetector CT imaging of the neck was performed following the standard protocol without intravenous contrast.  COMPARISON:  Head CT 06/23/2008 and cervical spine CT 12/24/2010  FINDINGS: Examination demonstrates the spaces of the suprahyoid neck to be within normal without focal mass, adenopathy, fluid collection or inflammatory change. Airway is patent. Epiglottis and subglottic airway are normal. No upper teeth are present. Several dental caries over the visualized lower teeth. No evidence of periodontal disease.  Visualized orbits and paranasal sinuses are within normal. Mastoid air cells are clear. Salivary glands are normal and symmetric.  Infrahyoid neck demonstrates minimal thyroid tissue as otherwise within normal. There is moderate calcified plaque over the carotid bifurcations bilaterally. Lung apices are normal. There is mild spondylosis of the cervical spine.  IMPRESSION: No acute findings.   Electronically Signed   By: Marin Olp M.D.   On: 05/22/2015 17:51    DISCHARGE EXAMINATION: Filed Vitals:   05/23/15 0459 05/23/15 1420 05/23/15 1939 05/24/15 0342  BP: 135/48 137/56 143/56 116/59  Pulse: 80 89 95 82  Temp: 98.4 F (36.9 C) 99.1 F (37.3 C) 99.3 F (37.4 C) 98.1 F (36.7 C)  TempSrc: Oral Oral Oral Oral  Resp: 18 16 16 16   Height:      Weight:      SpO2: 98% 97% 93% 97%   General appearance: alert, cooperative, appears stated age and no distress Resp: clear to auscultation bilaterally Cardio: regular rate and rhythm, S1, S2 normal, no murmur, click, rub or gallop GI: soft, non-tender; bowel sounds  normal; no masses,  no organomegaly Extremities: extremities normal, atraumatic, no cyanosis or edema  DISPOSITION: Home with home health  Discharge Instructions    Call MD for:  difficulty breathing, headache or visual disturbances    Complete by:  As directed      Call MD for:  extreme fatigue    Complete by:  As directed      Call MD for:  persistant dizziness or light-headedness    Complete by:  As directed      Call MD for:  persistant nausea and vomiting    Complete by:  As directed      Call MD for:  severe uncontrolled pain    Complete by:  As directed      Diet - low sodium heart healthy    Complete by:  As directed      Discharge instructions    Complete by:  As directed   Please follow up with your PCP within 1 week.  You were cared for by a hospitalist during your hospital stay. If you have any questions about your discharge medications or the care you  received while you were in the hospital after you are discharged, you can call the unit and asked to speak with the hospitalist on call if the hospitalist that took care of you is not available. Once you are discharged, your primary care physician will handle any further medical issues. Please note that NO REFILLS for any discharge medications will be authorized once you are discharged, as it is imperative that you return to your primary care physician (or establish a relationship with a primary care physician if you do not have one) for your aftercare needs so that they can reassess your need for medications and monitor your lab values. If you do not have a primary care physician, you can call [phone] for a physician referral.     Increase activity slowly    Complete by:  As directed            ALLERGIES:  Allergies  Allergen Reactions  . Clarithromycin Swelling    Throat swelling  . Prednisone Swelling    Throat swelling and tightness from throat to stomach (05/22/15)      Discharge Medication List as of 05/24/2015  12:04 PM    CONTINUE these medications which have NOT CHANGED   Details  acetaminophen (TYLENOL) 500 MG tablet Take 500 mg by mouth every 6 (six) hours as needed (pain)., Until Discontinued, Historical Med    allopurinol (ZYLOPRIM) 100 MG tablet Take 200 mg by mouth daily. , Starting 06/12/2014, Until Discontinued, Historical Med    amLODipine-valsartan (EXFORGE) 5-160 MG per tablet Take 0.5 tablets by mouth daily. , Until Discontinued, Historical Med    aspirin EC 81 MG tablet Take 81 mg by mouth daily., Until Discontinued, Historical Med    atorvastatin (LIPITOR) 10 MG tablet Take 10 mg by mouth daily., Until Discontinued, Historical Med    Cyanocobalamin (VITAMIN B-12 IJ) Inject as directed every 30 (thirty) days. Next injection due 05/24/15 at Dr. Eugenio Hoes office, Until Discontinued, Historical Med    furosemide (LASIX) 40 MG tablet Take 40 mg by mouth daily., Until Discontinued, Historical Med    lansoprazole (PREVACID) 15 MG capsule Take 15 mg by mouth daily at 12 noon., Until Discontinued, Historical Med    levothyroxine (SYNTHROID, LEVOTHROID) 50 MCG tablet Take 50 mcg by mouth daily., Until Discontinued, Historical Med    Polyethyl Glycol-Propyl Glycol (SYSTANE OP) Place 1 drop into both eyes 2 (two) times daily as needed (dry eyes)., Until Discontinued, Historical Med      STOP taking these medications     naproxen (NAPROSYN) 250 MG tablet      traMADol-acetaminophen (ULTRACET) 37.5-325 MG per tablet        Follow-up Information    Follow up with Dwan Bolt, MD. Schedule an appointment as soon as possible for a visit in 1 week.   Specialty:  Endocrinology   Why:  post hospitalization follow up   Contact information:   [address] [place] [address] [phone]       Follow up with [facility].   Why:  rolling walker   Contact information:   [address] [place] [address] [phone]       Follow up  with [facility].   Why:  Physical therpist, registered nurse, occupational therapist   Contact information:   [place] [address] [phone]       TOTAL DISCHARGE TIME: 8 minutes  [facility] Pager [phone]  05/24/2015, 3:55 PM

## 2015-05-24 NOTE — Evaluation (Addendum)
Occupational Therapy Evaluation Patient Details Name: Jennifer Patel MRN: [id number] DOB: [birth year] Today's Date: 05/24/2015    History of Present Illness 81 year old Caucasian female with a past medical history of hypertension, hyperlipidemia, presented with complaints of neck pain, throat swelling and abdominal swelling with a few episodes of loose stools. She was noted to have elevated BUN and creatinine. CT of neck negative   Clinical Impression   Pt admitted with above. Education provided in session and pt planning to d/c today. She verbalized understanding of information covered today. Recommended pt have home health therapist run through tub transfer with her at home.     Follow Up Recommendations  No OT follow up;Supervision - Intermittent    Equipment Recommendations       Recommendations for Other Services       Precautions / Restrictions Precautions Precautions: Fall Restrictions Weight Bearing Restrictions: No      Mobility Bed Mobility        General bed mobility comments: not assessed  Transfers Overall transfer level: Modified independent Equipment used: None                  Balance Supervision for ambulation. Assist for tub transfer (see ADL section below).                      ADL Overall ADL's : Needs assistance/impaired         Upper Body Bathing: Set up;Sitting   Lower Body Bathing: Supervison/ safety;Set up (standing)   Upper Body Dressing : Set up;Sitting   Lower Body Dressing: Sit to/from stand;Supervision/safety;Set up   Toilet Transfer: Supervision/safety;Ambulation (chair)       Tub/ Shower Transfer: Maximal assistance;Ambulation;Tub transfer (Max assist for simulated transfer to sit down in tub and get back up; Min guard-Min assist to step over simulated tub)   Functional mobility during ADLs: Supervision/safety (for ambulation; see tub transfer for assist level) General ADL Comments: Educated on tub transfer  techniques and options for shower chair. Educated on safety such as safe footwear and rugs/items on floor. Pt is alone most of the time, so OT recommended sitting on chair and swinging legs in for tub transfer technique for safety. Pt normally gets all the way down in tub, so tried to simulate this in session and pt unable to get up off of floor without heavy assist and OT helped control descent to sitting position on floor (pt does have grab bar at home). Practiced stepping over tub and pt did well (Min guard-Min assist), but she thought her tub was a little lower than what we practiced in the room. Recommended pt ask therapist that is coming to her house to run through the tub transfer technique that OT recommended her do.     Vision     Perception     Praxis      Pertinent Vitals/Pain Pain Assessment: 0-10 Pain Score:  (4-5) Pain Location: neck when she turns it Pain Descriptors / Indicators: Sore Pain Intervention(s): Monitored during session     Hand Dominance     Extremity/Trunk Assessment Upper Extremity Assessment Upper Extremity Assessment: Generalized weakness   Lower Extremity Assessment Lower Extremity Assessment: Defer to PT evaluation       Communication Communication Communication: No difficulties   Cognition Arousal/Alertness: Awake/alert Behavior During Therapy: WFL for tasks assessed/performed Overall Cognitive Status: Within Functional Limits for tasks assessed  General Comments          Shoulder Instructions      Home Living Family/patient expects to be discharged to:: Private residence Living Arrangements: Alone Available Help at Discharge: Family;Available PRN/intermittently (not everyday) Type of Home: Independent living facility Home Access: Level entry     Home Layout: One level     Bathroom Shower/Tub: Teacher, early years/pre: Handicapped height     Home Equipment: Hand held shower head; grab  bars in tub/shower          Prior Functioning/Environment Level of Independence: Independent        Comments: pt states she walks on her own, no falls, doesn't drive and participates in activities at the center    OT Diagnosis: Generalized weakness;Acute pain   OT Problem List:     OT Treatment/Interventions:      OT Goals(Current goals can be found in the care plan section)   OT Frequency:     Barriers to D/C:            Co-evaluation              End of Session Equipment Utilized During Treatment: Gait belt  Activity Tolerance: Patient tolerated treatment well Patient left: in chair;with call bell/phone within reach;with family/visitor present   Time: 1043-1105 OT Time Calculation (min): 22 min Charges:  OT General Charges $OT Visit: 1 Procedure OT Evaluation $Initial OT Evaluation Tier I: 1 Procedure G-Codes: OT G-codes **NOT FOR INPATIENT CLASS** Functional Assessment Tool Used: clinical judgment Functional Limitation: Self care Self Care Current Status [id number]): At least 20 percent but less than 40 percent impaired, limited or restricted Self Care Goal Status [id number]): At least 20 percent but less than 40 percent impaired, limited or restricted Self Care Discharge Status [phone]): At least 20 percent but less than 40 percent impaired, limited or restricted  Benito Mccreedy OTR/L [id number] 05/24/2015, 11:55 AM

## 2015-06-09 ENCOUNTER — Ambulatory Visit: Payer: Self-pay | Admitting: Podiatry

## 2015-06-22 ENCOUNTER — Encounter: Payer: Self-pay | Admitting: Podiatry

## 2015-06-22 ENCOUNTER — Ambulatory Visit: Payer: Self-pay | Admitting: Podiatry

## 2015-06-22 ENCOUNTER — Ambulatory Visit (INDEPENDENT_AMBULATORY_CARE_PROVIDER_SITE_OTHER): Payer: PPO | Admitting: Podiatry

## 2015-06-22 DIAGNOSIS — Q828 Other specified congenital malformations of skin: Secondary | ICD-10-CM

## 2015-06-22 NOTE — Progress Notes (Signed)
Patient ID: Jennifer Patel, female   DOB: [birth year], 81 y.o.   MRN: [id number]  Subjective: This patient presents for scheduled visit complaining of painful nucleated plantar keratoses right greater than left. The interval between debridement is approximately 2 months  Objective: Nucleated plantar keratoses sub-first and fifth MPJ right Keratoses first and fifth MPJ left There is no erythema, edema or drainage around the plantar skin lesions bilaterally  Assessment: Porokeratosis 2 Keratoses 2  Plan: Debrided keratoses 4 without any bleeding Apply salinocaine 2 porokeratosis 1 and 5 right   Reappoint 2 months

## 2015-06-22 NOTE — Patient Instructions (Signed)
Leave acid dressing on right foot if possible 1-3 days. Remove when the dressing gets wet

## 2015-08-12 ENCOUNTER — Encounter (HOSPITAL_COMMUNITY): Payer: Self-pay | Admitting: Emergency Medicine

## 2015-08-12 ENCOUNTER — Emergency Department (HOSPITAL_COMMUNITY): Payer: PPO

## 2015-08-12 ENCOUNTER — Emergency Department (HOSPITAL_COMMUNITY)
Admission: EM | Admit: 2015-08-12 | Discharge: 2015-08-12 | Disposition: A | Discharge status: Home / Self Care | Payer: PPO | Attending: Physician Assistant | Admitting: Physician Assistant

## 2015-08-12 DIAGNOSIS — R05 Cough: Secondary | ICD-10-CM | POA: Diagnosis present

## 2015-08-12 DIAGNOSIS — B9789 Other viral agents as the cause of diseases classified elsewhere: Secondary | ICD-10-CM

## 2015-08-12 DIAGNOSIS — E079 Disorder of thyroid, unspecified: Secondary | ICD-10-CM | POA: Insufficient documentation

## 2015-08-12 DIAGNOSIS — K521 Toxic gastroenteritis and colitis: Secondary | ICD-10-CM | POA: Diagnosis not present

## 2015-08-12 DIAGNOSIS — Z7982 Long term (current) use of aspirin: Secondary | ICD-10-CM | POA: Insufficient documentation

## 2015-08-12 DIAGNOSIS — Z87891 Personal history of nicotine dependence: Secondary | ICD-10-CM | POA: Diagnosis not present

## 2015-08-12 DIAGNOSIS — Z79899 Other long term (current) drug therapy: Secondary | ICD-10-CM | POA: Diagnosis not present

## 2015-08-12 DIAGNOSIS — J069 Acute upper respiratory infection, unspecified: Secondary | ICD-10-CM | POA: Diagnosis not present

## 2015-08-12 DIAGNOSIS — I10 Essential (primary) hypertension: Secondary | ICD-10-CM | POA: Insufficient documentation

## 2015-08-12 DIAGNOSIS — E782 Mixed hyperlipidemia: Secondary | ICD-10-CM | POA: Diagnosis not present

## 2015-08-12 DIAGNOSIS — E871 Hypo-osmolality and hyponatremia: Secondary | ICD-10-CM | POA: Diagnosis not present

## 2015-08-12 DIAGNOSIS — T3695XA Adverse effect of unspecified systemic antibiotic, initial encounter: Secondary | ICD-10-CM

## 2015-08-12 DIAGNOSIS — T65891A Toxic effect of other specified substances, accidental (unintentional), initial encounter: Secondary | ICD-10-CM | POA: Diagnosis not present

## 2015-08-12 DIAGNOSIS — T360X5A Adverse effect of penicillins, initial encounter: Secondary | ICD-10-CM | POA: Insufficient documentation

## 2015-08-12 LAB — COMPREHENSIVE METABOLIC PANEL
ALT: 16 U/L (ref 14–54)
AST: 27 U/L (ref 15–41)
Albumin: 3.7 g/dL (ref 3.5–5.0)
Alkaline Phosphatase: 77 U/L (ref 38–126)
Anion gap: 11 (ref 5–15)
BILIRUBIN TOTAL: 0.7 mg/dL (ref 0.3–1.2)
BUN: 21 mg/dL — ABNORMAL HIGH (ref 6–20)
CO2: 21 mmol/L — ABNORMAL LOW (ref 22–32)
CREATININE: 1.44 mg/dL — AB (ref 0.44–1.00)
Calcium: 9.6 mg/dL (ref 8.9–10.3)
Chloride: 95 mmol/L — ABNORMAL LOW (ref 101–111)
GFR calc Af Amer: 39 mL/min — ABNORMAL LOW (ref >60)
GFR, EST NON AFRICAN AMERICAN: 33 mL/min — AB (ref >60)
Glucose, Bld: 101 mg/dL — ABNORMAL HIGH (ref 65–99)
Potassium: 4.6 mmol/L (ref 3.5–5.1)
Sodium: 127 mmol/L — ABNORMAL LOW (ref 135–145)
TOTAL PROTEIN: 6.7 g/dL (ref 6.5–8.1)

## 2015-08-12 LAB — URINALYSIS, ROUTINE W REFLEX MICROSCOPIC
BILIRUBIN URINE: NEGATIVE
Glucose, UA: NEGATIVE mg/dL
Hgb urine dipstick: NEGATIVE
Ketones, ur: NEGATIVE mg/dL
NITRITE: NEGATIVE
PROTEIN: NEGATIVE mg/dL
SPECIFIC GRAVITY, URINE: 1.005 (ref 1.005–1.030)
UROBILINOGEN UA: 0.2 mg/dL (ref 0.0–1.0)
pH: 6 (ref 5.0–8.0)

## 2015-08-12 LAB — CBC
HCT: 31.6 % — ABNORMAL LOW (ref 36.0–46.0)
Hemoglobin: 10.7 g/dL — ABNORMAL LOW (ref 12.0–15.0)
MCH: 30.8 pg (ref 26.0–34.0)
MCHC: 33.9 g/dL (ref 30.0–36.0)
MCV: 91.1 fL (ref 78.0–100.0)
PLATELETS: 182 10*3/uL (ref 150–400)
RBC: 3.47 MIL/uL — AB (ref 3.87–5.11)
RDW: 13.8 % (ref 11.5–15.5)
WBC: 5.3 10*3/uL (ref 4.0–10.5)

## 2015-08-12 LAB — URINE MICROSCOPIC-ADD ON

## 2015-08-12 LAB — LIPASE, BLOOD: LIPASE: 31 U/L (ref 11–51)

## 2015-08-12 MED ORDER — SODIUM CHLORIDE 0.9 % IV BOLUS (SEPSIS)
500.0000 mL | Freq: Once | INTRAVENOUS | Status: AC
  Administered 2015-08-12: 500 mL via INTRAVENOUS

## 2015-08-12 MED ORDER — DOXYCYCLINE HYCLATE 100 MG PO CAPS: 100.0000 mg | ORAL_CAPSULE | Freq: Two times a day (BID) | ORAL | Status: DC

## 2015-08-12 MED ORDER — SODIUM CHLORIDE 0.9 % IV BOLUS (SEPSIS)
250.0000 mL | Freq: Once | INTRAVENOUS | Status: AC
  Administered 2015-08-12: 250 mL via INTRAVENOUS

## 2015-08-12 MED ORDER — ONDANSETRON 4 MG PO TBDP
4.0000 mg | ORAL_TABLET | Freq: Once | ORAL | Status: AC | PRN
  Administered 2015-08-12: 4 mg via ORAL
  Filled 2015-08-12: qty 1

## 2015-08-12 MED ORDER — LOPERAMIDE HCL 2 MG PO CAPS
4.0000 mg | ORAL_CAPSULE | Freq: Once | ORAL | Status: AC
  Administered 2015-08-12: 4 mg via ORAL
  Filled 2015-08-12: qty 2

## 2015-08-12 MED ORDER — HYDROCODONE-HOMATROPINE 5-1.5 MG/5ML PO SYRP
5.0000 mL | ORAL_SOLUTION | Freq: Once | ORAL | Status: AC
  Administered 2015-08-12: 5 mL via ORAL
  Filled 2015-08-12: qty 5

## 2015-08-12 NOTE — ED Notes (Signed)
Pt reports abd pain with nausea and diarrhea (no vomiting) and throat pain; pt also reports cough where "I feel like I need to cough something up but I cant"; pt reports intermittent fevers- none today; pt contributes symptoms to flu shot on sept 26; pt states "I just aint getting no better"

## 2015-08-12 NOTE — Discharge Instructions (Signed)
1. Medications: doxycycline, usual home medications 2. Treatment: rest, drink plenty of fluids, use cough medication as needed 3. Follow Up: Please followup with your primary doctor in 1 day for discussion of your diagnoses and recheck of your sodium levels; Please return to the ER for worsening symptoms, persistent diarrhea or other concerns   Diarrhea Diarrhea is frequent loose and watery bowel movements. It can cause you to feel weak and dehydrated. Dehydration can cause you to become tired and thirsty, have a dry mouth, and have decreased urination that often is dark yellow. Diarrhea is a sign of another problem, most often an infection that will not last long. In most cases, diarrhea typically lasts 2-3 days. However, it can last longer if it is a sign of something more serious. It is important to treat your diarrhea as directed by your caregiver to lessen or prevent future episodes of diarrhea. CAUSES  Some common causes include:  Gastrointestinal infections caused by viruses, bacteria, or parasites.  Food poisoning or food allergies.  Certain medicines, such as antibiotics, chemotherapy, and laxatives.  Artificial sweeteners and fructose.  Digestive disorders. HOME CARE INSTRUCTIONS  Ensure adequate fluid intake (hydration): Have 1 cup (8 oz) of fluid for each diarrhea episode. Avoid fluids that contain simple sugars or sports drinks, fruit juices, whole milk products, and sodas. Your urine should be clear or pale yellow if you are drinking enough fluids. Hydrate with an oral rehydration solution that you can purchase at pharmacies, retail stores, and online. You can prepare an oral rehydration solution at home by mixing the following ingredients together:   - tsp table salt.   tsp baking soda.   tsp salt substitute containing potassium chloride.  1  tablespoons sugar.  1 L (34 oz) of water.  Certain foods and beverages may increase the speed at which food moves through the  gastrointestinal (GI) tract. These foods and beverages should be avoided and include:  Caffeinated and alcoholic beverages.  High-fiber foods, such as raw fruits and vegetables, nuts, seeds, and whole grain breads and cereals.  Foods and beverages sweetened with sugar alcohols, such as xylitol, sorbitol, and mannitol.  Some foods may be well tolerated and may help thicken stool including:  Starchy foods, such as rice, toast, pasta, low-sugar cereal, oatmeal, grits, baked potatoes, crackers, and bagels.  Bananas.  Applesauce.  Add probiotic-rich foods to help increase healthy bacteria in the GI tract, such as yogurt and fermented milk products.  Wash your hands well after each diarrhea episode.  Only take over-the-counter or prescription medicines as directed by your caregiver.  Take a warm bath to relieve any burning or pain from frequent diarrhea episodes. SEEK IMMEDIATE MEDICAL CARE IF:   You are unable to keep fluids down.  You have persistent vomiting.  You have blood in your stool, or your stools are black and tarry.  You do not urinate in 6-8 hours, or there is only a small amount of very dark urine.  You have abdominal pain that increases or localizes.  You have weakness, dizziness, confusion, or light-headedness.  You have a severe headache.  Your diarrhea gets worse or does not get better.  You have a fever or persistent symptoms for more than 2-3 days.  You have a fever and your symptoms suddenly get worse. MAKE SURE YOU:   Understand these instructions.  Will watch your condition.  Will get help right away if you are not doing well or get worse.   This information is  not intended to replace advice given to you by your health care provider. Make sure you discuss any questions you have with your health care provider.   Document Released: 09/29/2002 Document Revised: 10/30/2014 Document Reviewed: 06/16/2012 Elsevier Interactive Patient Education [employer]

## 2015-08-12 NOTE — ED Provider Notes (Signed)
CSN: [id number]     Arrival date & time 08/12/15  [id number] History   First MD Initiated Contact with Patient 08/12/15 [phone]     Chief Complaint  Patient presents with  . Diarrhea  . Cough     (Consider location/radiation/quality/duration/timing/severity/associated sxs/prior Treatment) The history is provided by the patient, medical records and a relative. No language interpreter was used.     Jennifer Patel is a 81 y.o. female  with a hx of HTN, thyroid disease, high cholesterol presents to the Emergency Department complaining of gradual, persistent, progressively worsening diarrhea without vomiting onset yesterday evening. Associated symptoms include cough, sore throat, intermittent fevers.  Nursing note reports abd pain, but pt reports no pain only "rumbling."  No treatments PTA.  Nothing makes it better and nothing makes it worse.  Pt reports she saw her PCP on Tuesday (2 days ago) including cough medicine (codeinie with guaifenesin) and Augmentin.  Pt reports subjective fever for the last 1 weeks.  Pt denies chills, chest pain, SOB, abd pain, vomiting, dizziness, syncope.  Pt also reports dysuria x 3 days.  Pt reports she received her flu shot on Sept 26th.     Past Medical History  Diagnosis Date  . Hypertension   . Thyroid disease   . High cholesterol    Past Surgical History  Procedure Laterality Date  . Abdominal hysterectomy    . Cholecystectomy     History reviewed. No pertinent family history. Social History  Substance Use Topics  . Smoking status: Former Research scientist (life sciences)  . Smokeless tobacco: Never Used  . Alcohol Use: No   OB History    No data available     Review of Systems  Constitutional: Positive for fever ( subjective) and fatigue. Negative for diaphoresis, appetite change and unexpected weight change.  HENT: Negative for mouth sores.   Eyes: Negative for visual disturbance.  Respiratory: Positive for cough. Negative for chest tightness, shortness of breath and  wheezing.   Cardiovascular: Negative for chest pain.  Gastrointestinal: Positive for nausea and diarrhea. Negative for vomiting, abdominal pain and constipation.  Endocrine: Negative for polydipsia, polyphagia and polyuria.  Genitourinary: Negative for dysuria, urgency, frequency and hematuria.  Musculoskeletal: Negative for back pain and neck stiffness.  Skin: Negative for rash.  Allergic/Immunologic: Negative for immunocompromised state.  Neurological: Positive for weakness. Negative for syncope, light-headedness and headaches.  Hematological: Does not bruise/bleed easily.  Psychiatric/Behavioral: Negative for sleep disturbance. The patient is not nervous/anxious.       Allergies  Clarithromycin and Prednisone  Home Medications   Prior to Admission medications   Medication Sig Start Date End Date Taking? Authorizing Provider  acetaminophen (TYLENOL) 500 MG tablet Take 500 mg by mouth every 6 (six) hours as needed (pain).   Yes Historical Provider, MD  allopurinol (ZYLOPRIM) 100 MG tablet Take 200 mg by mouth daily.  06/12/14  Yes Historical Provider, MD  amoxicillin-clavulanate (AUGMENTIN) 875-125 MG tablet Take 1 tablet by mouth 2 (two) times daily. 08/10/15 08/17/15 Yes Historical Provider, MD  aspirin EC 81 MG tablet Take 81 mg by mouth daily.   Yes Historical Provider, MD  atorvastatin (LIPITOR) 10 MG tablet Take 10 mg by mouth daily.   Yes Historical Provider, MD  chlorthalidone (HYGROTON) 25 MG tablet Take 25 mg by mouth daily.   Yes Historical Provider, MD  Cyanocobalamin (VITAMIN B-12 IJ) Inject as directed every 30 (thirty) days. Next injection due 05/24/15 at Dr. Eugenio Hoes office   Yes Historical Provider, MD  Guaifenesin 1200 MG TB12 Take 1 tablet by mouth every 12 (twelve) hours as needed (mucus).   Yes Historical Provider, MD  guaiFENesin-codeine (ROBITUSSIN AC) 100-10 MG/5ML syrup Take 5 mLs by mouth every 6 (six) hours.   Yes Historical Provider, MD  lansoprazole (PREVACID)  15 MG capsule Take 15 mg by mouth daily at 12 noon.   Yes Historical Provider, MD  levothyroxine (SYNTHROID, LEVOTHROID) 50 MCG tablet Take 50 mcg by mouth daily.   Yes Historical Provider, MD  Polyethyl Glycol-Propyl Glycol (SYSTANE OP) Place 1 drop into both eyes 2 (two) times daily as needed (dry eyes).   Yes Historical Provider, MD  spironolactone (ALDACTONE) 25 MG tablet Take 25 mg by mouth daily.   Yes Historical Provider, MD  valsartan (DIOVAN) 320 MG tablet Take 320 mg by mouth daily.   Yes Historical Provider, MD  doxycycline (VIBRAMYCIN) 100 MG capsule Take 1 capsule (100 mg total) by mouth 2 (two) times daily. 08/12/15   Marcelino Campos, PA-C   BP 121/44 mmHg  Pulse 59  Temp(Src) 98.4 F (36.9 C) (Oral)  Resp 19  Ht 4\' 9"  (1.448 m)  Wt 105 lb (47.628 kg)  BMI 22.72 kg/m2  SpO2 100% Physical Exam  Constitutional: She is oriented to person, place, and time. She appears well-developed and well-nourished. No distress.  Awake, alert, nontoxic appearance  HENT:  Head: Normocephalic and atraumatic.  Right Ear: Tympanic membrane, external ear and ear canal normal.  Left Ear: Tympanic membrane, external ear and ear canal normal.  Nose: Nose normal. No epistaxis. Right sinus exhibits no maxillary sinus tenderness and no frontal sinus tenderness. Left sinus exhibits no maxillary sinus tenderness and no frontal sinus tenderness.  Mouth/Throat: Uvula is midline, oropharynx is clear and moist and mucous membranes are normal. Mucous membranes are not pale and not cyanotic. No oropharyngeal exudate, posterior oropharyngeal edema, posterior oropharyngeal erythema or tonsillar abscesses.  Eyes: Conjunctivae are normal. Pupils are equal, round, and reactive to light. No scleral icterus.  Neck: Normal range of motion and full passive range of motion without pain. Neck supple.  Cardiovascular: Normal rate, regular rhythm, normal heart sounds and intact distal pulses.   No murmur  heard. Pulmonary/Chest: Effort normal and breath sounds normal. No stridor. No respiratory distress. She has no wheezes.  Equal chest expansion Dry cough Clear and equal breath sounds without wheezing or accessory muscle usage  Abdominal: Soft. Bowel sounds are normal. She exhibits no mass. There is no tenderness. There is no rebound and no guarding.  Abdomen soft and nontender  Musculoskeletal: Normal range of motion. She exhibits no edema.  Lymphadenopathy:    She has no cervical adenopathy.  Neurological: She is alert and oriented to person, place, and time.  Speech is clear and goal oriented Moves extremities without ataxia  Skin: Skin is warm and dry. No rash noted. She is not diaphoretic.  Psychiatric: She has a normal mood and affect.  Nursing note and vitals reviewed.   ED Course  Procedures (including critical care time) Labs Review Labs Reviewed  COMPREHENSIVE METABOLIC PANEL - Abnormal; Notable for the following:    Sodium 127 (*)    Chloride 95 (*)    CO2 21 (*)    Glucose, Bld 101 (*)    BUN 21 (*)    Creatinine, Ser 1.44 (*)    GFR calc non Af Amer 33 (*)    GFR calc Af Amer 39 (*)    All other components within normal limits  CBC - Abnormal; Notable for the following:    RBC 3.47 (*)    Hemoglobin 10.7 (*)    HCT 31.6 (*)    All other components within normal limits  URINALYSIS, ROUTINE W REFLEX MICROSCOPIC (NOT AT [facility]) - Abnormal; Notable for the following:    Leukocytes, UA MODERATE (*)    All other components within normal limits  LIPASE, BLOOD  URINE MICROSCOPIC-ADD ON    Imaging Review Dg Chest 2 View  08/12/2015  CLINICAL DATA:  Cough and fever for several days. Acute kidney injury. EXAM: CHEST  2 VIEW COMPARISON:  06/01/2015 and 05/22/2015 FINDINGS: Changes of COPD are again noted. Moderate size hiatal hernia again demonstrated. Mild cardiomegaly stable. No evidence of pulmonary consolidation or pleural effusion. Bilateral upper lobe nodular  densities are seen which were not demonstrated on prior exams. Neoplasm/metastatic disease cannot be excluded. IMPRESSION: Bilateral upper lobe nodular densities appear new since previous study. Chest CT without contrast recommended for further evaluation. COPD. Moderate hiatal hernia. Electronically Signed   By: Earle Gell M.D.   On: 08/12/2015 07:39   Ct Chest Wo Contrast  08/12/2015  CLINICAL DATA:  Dry cough for 3 weeks. Fever. Upper lobe nodular densities on today's chest radiograph. EXAM: CT CHEST WITHOUT CONTRAST TECHNIQUE: Multidetector CT imaging of the chest was performed following the standard protocol without IV contrast. COMPARISON:  Plain film of earlier today and 06/01/2015. Chest CT 01/04/2012. FINDINGS: Mediastinum/Nodes: Aortic and branch vessel atherosclerosis. Tortuous descending thoracic aorta. Normal heart size, without pericardial effusion. Multivessel coronary artery atherosclerosis. 9 mm precarinal node is not significantly changed and not pathologic by size criteria. No mediastinal or definite hilar adenopathy, given limitations of unenhanced CT. Moderate hiatal hernia, with nearly 1/2 of the stomach positioned within the lower chest. Lungs/Pleura: No pleural fluid. Subtle right upper lobe interstitial thickening, including on image/series 21/3. Favored to represent scarring. Improvement in appearance of the lung bases. Mild bronchiectasis and mucous plugging remain at the right lung base, including on image/series 43/3. No pulmonary correlate for the bilateral upper lobe nodular densities on plain film. Minimal dependent opacity in the right lower lobe on image/series 28/3 is likely due to subsegmental atelectasis. Upper abdomen: Normal imaged portions of the liver, spleen, stomach, pancreas, adrenal glands, kidneys. Musculoskeletal: Accentuation of expected thoracic kyphosis. IMPRESSION: 1. No acute process in the chest. No correlate for the plain film upper lobe densities. 2.  Improved bibasilar aeration with mild bronchiectasis and mucous plugging remaining. This could relate to prior infection or aspiration. 3. Moderate hiatal hernia. 4.  Atherosclerosis, including within the coronary arteries. Electronically Signed   By: Abigail Miyamoto M.D.   On: 08/12/2015 09:55   I have personally reviewed and evaluated these images and lab results as part of my medical decision-making.   EKG Interpretation None      MDM   Final diagnoses:  Viral URI with cough  Antibiotic-associated diarrhea  Hyponatremia   Bianca E Armendariz presents for cough, congestion and URI symptoms for several weeks. Patient was recently placed on an Augmentin after which she began having diarrhea.  Initial triage note reports abdominal pain however patient is adamant that she has had no pain just diarrhea and nausea without vomiting.  She reports her throat from her cough and intermittent severity fevers but none today.  10 AM Urinalysis via evidence of urinary tract infection. Serum creatinine elevated to 1.44 likely due to dehydration. Patient has been given 750 mL's of fluid. Patient also noted to  have hyponatremia at 127 and hypochloremia at 95. This is likely due to her poor by mouth intake.  Mild anemia at 10.7, however ground baseline. Patient's chest x-ray with questionable bilateral upper lobe nodular densities however there is no acute process seen on her CT scan.  We will by mouth trial and ambulate here in emergency department. She appears well and is feeling some better.  11:15 AM Patient ambulatory in the department without assistance. She is tolerating by mouth. She reports she feels some better.  Patient's daughter is at bedside and agrees to watch over her mother. Patient will have  follow-up with her primary care physician tomorrow morning and to recheck labs and reassess her situation. Strict return precautions given.  BP 121/44 mmHg  Pulse 59  Temp(Src) 98.4 F (36.9 C) (Oral)  Resp  19  Ht 4\' 9"  (1.448 m)  Wt 105 lb (47.628 kg)  BMI 22.72 kg/m2  SpO2 100%  The patient was discussed with and seen by Dr. Thomasene Lot who agrees with the treatment plan.   Abigail Butts, PA-C 08/12/15 [place], MD 08/12/15 [phone]

## 2015-08-12 NOTE — ED Notes (Signed)
Patient walked to bathroom with only stand by assistance from this EMT

## 2015-08-12 NOTE — ED Notes (Signed)
Pt return from xray.

## 2015-08-12 NOTE — ED Notes (Signed)
Pt informed of need for urine sample; urine specimen cup placed at bedside and instructed patient to use call bell when ready to void; pt verbalized understanding

## 2015-08-17 ENCOUNTER — Emergency Department (HOSPITAL_COMMUNITY)
Admission: EM | Admit: 2015-08-17 | Discharge: 2015-08-17 | Disposition: A | Discharge status: Home / Self Care | Payer: PPO | Attending: Emergency Medicine | Admitting: Emergency Medicine

## 2015-08-17 ENCOUNTER — Emergency Department (HOSPITAL_COMMUNITY): Payer: PPO

## 2015-08-17 ENCOUNTER — Encounter (HOSPITAL_COMMUNITY): Payer: Self-pay

## 2015-08-17 DIAGNOSIS — Z79899 Other long term (current) drug therapy: Secondary | ICD-10-CM | POA: Diagnosis not present

## 2015-08-17 DIAGNOSIS — Z87891 Personal history of nicotine dependence: Secondary | ICD-10-CM | POA: Insufficient documentation

## 2015-08-17 DIAGNOSIS — R05 Cough: Secondary | ICD-10-CM | POA: Diagnosis not present

## 2015-08-17 DIAGNOSIS — Z7982 Long term (current) use of aspirin: Secondary | ICD-10-CM | POA: Insufficient documentation

## 2015-08-17 DIAGNOSIS — R197 Diarrhea, unspecified: Secondary | ICD-10-CM

## 2015-08-17 DIAGNOSIS — I10 Essential (primary) hypertension: Secondary | ICD-10-CM | POA: Insufficient documentation

## 2015-08-17 DIAGNOSIS — J3489 Other specified disorders of nose and nasal sinuses: Secondary | ICD-10-CM | POA: Insufficient documentation

## 2015-08-17 DIAGNOSIS — R059 Cough, unspecified: Secondary | ICD-10-CM

## 2015-08-17 DIAGNOSIS — E7800 Pure hypercholesterolemia, unspecified: Secondary | ICD-10-CM | POA: Insufficient documentation

## 2015-08-17 DIAGNOSIS — R1084 Generalized abdominal pain: Secondary | ICD-10-CM | POA: Diagnosis not present

## 2015-08-17 DIAGNOSIS — E079 Disorder of thyroid, unspecified: Secondary | ICD-10-CM | POA: Diagnosis not present

## 2015-08-17 DIAGNOSIS — Z792 Long term (current) use of antibiotics: Secondary | ICD-10-CM | POA: Diagnosis not present

## 2015-08-17 LAB — COMPREHENSIVE METABOLIC PANEL
ALBUMIN: 3.7 g/dL (ref 3.5–5.0)
ALT: 17 U/L (ref 14–54)
ANION GAP: 11 (ref 5–15)
AST: 32 U/L (ref 15–41)
Alkaline Phosphatase: 78 U/L (ref 38–126)
BUN: 17 mg/dL (ref 6–20)
CALCIUM: 9.5 mg/dL (ref 8.9–10.3)
CHLORIDE: 102 mmol/L (ref 101–111)
CO2: 20 mmol/L — AB (ref 22–32)
Creatinine, Ser: 1.28 mg/dL — ABNORMAL HIGH (ref 0.44–1.00)
GFR, EST AFRICAN AMERICAN: 45 mL/min — AB (ref >60)
GFR, EST NON AFRICAN AMERICAN: 38 mL/min — AB (ref >60)
Glucose, Bld: 100 mg/dL — ABNORMAL HIGH (ref 65–99)
Potassium: 4.6 mmol/L (ref 3.5–5.1)
SODIUM: 133 mmol/L — AB (ref 135–145)
Total Bilirubin: 0.9 mg/dL (ref 0.3–1.2)
Total Protein: 6.6 g/dL (ref 6.5–8.1)

## 2015-08-17 LAB — URINALYSIS, ROUTINE W REFLEX MICROSCOPIC
Bilirubin Urine: NEGATIVE
GLUCOSE, UA: NEGATIVE mg/dL
HGB URINE DIPSTICK: NEGATIVE
KETONES UR: NEGATIVE mg/dL
LEUKOCYTES UA: NEGATIVE
Nitrite: NEGATIVE
PROTEIN: NEGATIVE mg/dL
Specific Gravity, Urine: 1.009 (ref 1.005–1.030)
UROBILINOGEN UA: 0.2 mg/dL (ref 0.0–1.0)
pH: 5.5 (ref 5.0–8.0)

## 2015-08-17 LAB — LACTIC ACID, PLASMA: LACTIC ACID, VENOUS: 0.9 mmol/L (ref 0.5–2.0)

## 2015-08-17 LAB — LIPASE, BLOOD: LIPASE: 29 U/L (ref 11–51)

## 2015-08-17 LAB — CBC WITH DIFFERENTIAL/PLATELET
BASOS ABS: 0 10*3/uL (ref 0.0–0.1)
BASOS PCT: 0 %
EOS ABS: 0.1 10*3/uL (ref 0.0–0.7)
EOS PCT: 1 %
HCT: 31.1 % — ABNORMAL LOW (ref 36.0–46.0)
HEMOGLOBIN: 10.5 g/dL — AB (ref 12.0–15.0)
LYMPHS ABS: 0.6 10*3/uL — AB (ref 0.7–4.0)
Lymphocytes Relative: 10 %
MCH: 30.6 pg (ref 26.0–34.0)
MCHC: 33.8 g/dL (ref 30.0–36.0)
MCV: 90.7 fL (ref 78.0–100.0)
Monocytes Absolute: 0.3 10*3/uL (ref 0.1–1.0)
Monocytes Relative: 4 %
NEUTROS PCT: 85 %
Neutro Abs: 4.9 10*3/uL (ref 1.7–7.7)
PLATELETS: 202 10*3/uL (ref 150–400)
RBC: 3.43 MIL/uL — AB (ref 3.87–5.11)
RDW: 13.9 % (ref 11.5–15.5)
WBC: 5.9 10*3/uL (ref 4.0–10.5)

## 2015-08-17 LAB — I-STAT CG4 LACTIC ACID, ED
Lactic Acid, Venous: 0.83 mmol/L (ref 0.5–2.0)
Lactic Acid, Venous: 0.37 mmol/L — ABNORMAL LOW (ref 0.5–2.0)

## 2015-08-17 LAB — POC OCCULT BLOOD, ED: Fecal Occult Bld: NEGATIVE

## 2015-08-17 MED ORDER — BENZONATATE 100 MG PO CAPS: 100.0000 mg | ORAL_CAPSULE | Freq: Three times a day (TID) | ORAL | Status: DC | PRN

## 2015-08-17 MED ORDER — BENZONATATE 100 MG PO CAPS
100.0000 mg | ORAL_CAPSULE | Freq: Once | ORAL | Status: AC
  Administered 2015-08-17: 100 mg via ORAL
  Filled 2015-08-17: qty 1

## 2015-08-17 MED ORDER — ALBUTEROL SULFATE (2.5 MG/3ML) 0.083% IN NEBU
5.0000 mg | INHALATION_SOLUTION | Freq: Once | RESPIRATORY_TRACT | Status: AC
  Administered 2015-08-17: 5 mg via RESPIRATORY_TRACT
  Filled 2015-08-17: qty 6

## 2015-08-17 MED ORDER — SODIUM CHLORIDE 0.9 % IV BOLUS (SEPSIS)
250.0000 mL | Freq: Once | INTRAVENOUS | Status: AC
Start: 2015-08-17 — End: 2015-08-17
  Administered 2015-08-17: 250 mL via INTRAVENOUS

## 2015-08-17 MED ORDER — IOHEXOL 300 MG/ML  SOLN
75.0000 mL | Freq: Once | INTRAMUSCULAR | Status: AC | PRN
  Administered 2015-08-17: 75 mL via INTRAVENOUS

## 2015-08-17 NOTE — ED Provider Notes (Signed)
CSN: [id number]     Arrival date & time 08/17/15  1102 History   First MD Initiated Contact with Patient 08/17/15 1102     Chief Complaint  Patient presents with  . URI  . Diarrhea     (Consider location/radiation/quality/duration/timing/severity/associated sxs/prior Treatment) HPI   Jennifer Patel is a 81 y.o. female with PMH significant for HTN, thyroid disease, and high cholesterol who presents with URI and diarrhea (1 episode this morning).  Patient was seen here for similar symptoms 08/12/15 of cough and diarrhea.  Since then she has been experiencing abdominal pain, BRBPR, and vomiting.  Associated symptoms include subjective fevers, nausea, shortness of breath, cough, rhinorrhea, sore throat, and voice changes.  Denies CP, ear pain, or difficulty swallowing.  She states she has not been able to tolerate PO intake and has been taking doxycycline as prescribed from ED.  CXR showed questionable b/l upper lobe nodular densities and CT scan showed no acute processes.   Past Medical History  Diagnosis Date  . Hypertension   . Thyroid disease   . High cholesterol    Past Surgical History  Procedure Laterality Date  . Abdominal hysterectomy    . Cholecystectomy     History reviewed. No pertinent family history. Social History  Substance Use Topics  . Smoking status: Former Research scientist (life sciences)  . Smokeless tobacco: Never Used  . Alcohol Use: No   OB History    No data available     Review of Systems All other systems negative unless otherwise stated in HPI    Allergies  Clarithromycin and Prednisone  Home Medications   Prior to Admission medications   Medication Sig Start Date End Date Taking? Authorizing Provider  acetaminophen (TYLENOL) 500 MG tablet Take 500 mg by mouth every 6 (six) hours as needed (pain).   Yes Historical Provider, MD  allopurinol (ZYLOPRIM) 100 MG tablet Take 200 mg by mouth daily.  06/12/14  Yes Historical Provider, MD  amoxicillin-clavulanate  (AUGMENTIN) 875-125 MG tablet Take 1 tablet by mouth 2 (two) times daily. 08/10/15 08/17/15 Yes Historical Provider, MD  aspirin EC 81 MG tablet Take 81 mg by mouth daily.   Yes Historical Provider, MD  atorvastatin (LIPITOR) 10 MG tablet Take 10 mg by mouth daily.   Yes Historical Provider, MD  chlorthalidone (HYGROTON) 25 MG tablet Take 25 mg by mouth daily.   Yes Historical Provider, MD  Cyanocobalamin (VITAMIN B-12 IJ) Inject as directed every 30 (thirty) days. Next injection due 05/24/15 at Dr. Eugenio Hoes office   Yes Historical Provider, MD  doxycycline (VIBRAMYCIN) 100 MG capsule Take 1 capsule (100 mg total) by mouth 2 (two) times daily. 08/12/15  Yes Hannah Muthersbaugh, PA-C  guaiFENesin-codeine (ROBITUSSIN AC) 100-10 MG/5ML syrup Take 5 mLs by mouth every 6 (six) hours.   Yes Historical Provider, MD  lansoprazole (PREVACID) 15 MG capsule Take 15 mg by mouth daily at 12 noon.   Yes Historical Provider, MD  levothyroxine (SYNTHROID, LEVOTHROID) 50 MCG tablet Take 50 mcg by mouth daily.   Yes Historical Provider, MD  Polyethyl Glycol-Propyl Glycol (SYSTANE OP) Place 1 drop into both eyes 2 (two) times daily as needed (dry eyes).   Yes Historical Provider, MD  valsartan (DIOVAN) 320 MG tablet Take 320 mg by mouth daily.   Yes Historical Provider, MD  benzonatate (TESSALON) 100 MG capsule Take 1 capsule (100 mg total) by mouth 3 (three) times daily as needed for cough. 08/17/15   Gloriann Loan, PA-C  Guaifenesin 1200 MG  TB12 Take 1 tablet by mouth every 12 (twelve) hours as needed (mucus).    Historical Provider, MD  spironolactone (ALDACTONE) 25 MG tablet Take 25 mg by mouth daily.    Historical Provider, MD   BP 149/73 mmHg  Pulse 75  Temp(Src) 98.2 F (36.8 C) (Oral)  Resp 22  SpO2 100% Physical Exam  Constitutional: She is oriented to person, place, and time. She appears well-developed and well-nourished.  HENT:  Head: Normocephalic and atraumatic.  Right Ear: Tympanic membrane normal.   Left Ear: Tympanic membrane normal.  Nose: Rhinorrhea present.  Mouth/Throat: Uvula is midline and oropharynx is clear and moist. No trismus in the jaw. No uvula swelling. No oropharyngeal exudate, posterior oropharyngeal edema or posterior oropharyngeal erythema.  Eyes: Conjunctivae are normal. Pupils are equal, round, and reactive to light.  Neck: Normal range of motion. Neck supple.  Cardiovascular: Normal rate, regular rhythm and normal heart sounds.   No murmur heard. Pulmonary/Chest: Effort normal. No accessory muscle usage or stridor. No respiratory distress. She has decreased breath sounds. She has no wheezes. She has no rhonchi. She has no rales.  Abdominal: Soft. Bowel sounds are normal. She exhibits no distension. There is generalized tenderness. There is no rigidity, no rebound and no guarding.  Musculoskeletal: Normal range of motion.  Lymphadenopathy:    She has no cervical adenopathy.  Neurological: She is alert and oriented to person, place, and time.  Speech clear without dysarthria.  Skin: Skin is warm and dry.  Psychiatric: She has a normal mood and affect. Her behavior is normal.    ED Course  Procedures (including critical care time) Labs Review Labs Reviewed  CBC WITH DIFFERENTIAL/PLATELET - Abnormal; Notable for the following:    RBC 3.43 (*)    Hemoglobin 10.5 (*)    HCT 31.1 (*)    Lymphs Abs 0.6 (*)    All other components within normal limits  COMPREHENSIVE METABOLIC PANEL - Abnormal; Notable for the following:    Sodium 133 (*)    CO2 20 (*)    Glucose, Bld 100 (*)    Creatinine, Ser 1.28 (*)    GFR calc non Af Amer 38 (*)    GFR calc Af Amer 45 (*)    All other components within normal limits  I-STAT CG4 LACTIC ACID, ED - Abnormal; Notable for the following:    Lactic Acid, Venous 0.37 (*)    All other components within normal limits  C DIFFICILE QUICK SCREEN W PCR REFLEX  LACTIC ACID, PLASMA  URINALYSIS, ROUTINE W REFLEX MICROSCOPIC (NOT AT  [facility])  LIPASE, BLOOD  OCCULT BLOOD X 1 CARD TO LAB, STOOL  I-STAT CG4 LACTIC ACID, ED  POC OCCULT BLOOD, ED    Imaging Review Dg Neck Soft Tissue  08/17/2015  CLINICAL DATA:  Cough, sore throat, laryngitis for over 1 month. EXAM: NECK SOFT TISSUES - 1+ VIEW COMPARISON:  None. FINDINGS: Mild fullness in the soft tissues anterior to C5 and C6 may be positional. Epiglottis and aryepiglottic fold shadows are sharp. Mild uncovertebral hypertrophy and facet sclerosis in the cervical spine. IMPRESSION: Mild fullness in the soft tissues anterior to C5 and C6 may be positional. If further evaluation is desired, CT neck with contrast is recommended. Electronically Signed   By: Lorin Picket M.D.   On: 08/17/2015 12:18   Dg Chest 2 View  08/17/2015  CLINICAL DATA:  Cough, sore throat EXAM: CHEST  2 VIEW COMPARISON:  08/12/2015 FINDINGS: Cardiomediastinal silhouette is stable.  Mild thoracic dextroscoliosis. Again noted large left retrocardiac hiatal hernia. No acute infiltrate or pulmonary edema. Osteopenia and degenerative changes thoracic spine IMPRESSION: Mild thoracic dextroscoliosis. Again noted large hiatal hernia. Osteopenia and degenerative changes lumbar spine. Electronically Signed   By: Lahoma Crocker M.D.   On: 08/17/2015 12:16   Ct Soft Tissue Neck W Contrast  08/17/2015  CLINICAL DATA:  Patient complains of fullness in her throat with hoarseness for 2 weeks. EXAM: CT NECK WITH CONTRAST TECHNIQUE: Multidetector CT imaging of the neck was performed using the standard protocol following the bolus administration of intravenous contrast. CONTRAST:  93mL OMNIPAQUE IOHEXOL 300 MG/ML  SOLN COMPARISON:  05/22/2015. FINDINGS: Pharynx and larynx: Within normal limits. Salivary glands: Within normal limits. Thyroid: Negative. Lymph nodes: None pathologically enlarged. Vascular: Atherosclerotic calcification.  No acute findings. Limited intracranial: No acute findings. Visualized orbits: Not visualized.  Mastoids and visualized paranasal sinuses: Clear. Skeleton: No acute findings. Upper chest: Probable mucoid impaction in the right upper lobe (series 6, image 17). Soft tissues are unremarkable. IMPRESSION: No acute findings.  No findings to explain the patient's symptoms. Electronically Signed   By: Lorin Picket M.D.   On: 08/17/2015 14:15   I have personally reviewed and evaluated these images and lab results as part of my medical decision-making.   EKG Interpretation None      MDM   Final diagnoses:  Cough  Diarrhea, unspecified type    Patient presents with URI sxs and bloody stools.   VSS.  On exam, diffuse abdominal tenderness.  Lungs CTAB, breath sounds diminished.  Heart RRR.  Will obtain neck soft tissue given voice changes and concern for retropharyngeal abscess.  Will obtain CXR, UA, fecal occult, CBC, CMP, lipase, and lactic acid.  UA shows no evidence of UTI. CBC shows no leukocytosis, lactic acid 0.83.  CMP shows Cr of 1.28, which has decreased from 1.44 CXR shows no acute pulmonary infiltrate or pulmonary edema.  Neck soft tissue plain films show mild fullness in soft tissues anterior to C5 and C6.  Will obtain CT neck with reduced dose contrast. CT neck soft tissue shows no acute abnormalities.  Will give 250 mL fluids, tessalon capsule, and alb neb.  Patient states she feels somewhat better. Fecal occult negative.  Will ambulate in hallway.  Patient able to ambulate in hallway with assistance.  Patient stable for discharge.  VSS. Patient ambulatory in hallway.  Discussed return precautions. Follow up PCP in 2 days for recheck. Patient agrees and acknowledges the above plan for discharge.   Case has been discussed with and seen by Dr. Ralene Bathe who agrees with the above plan for discharge.     Gloriann Loan, PA-C 08/17/15 [place], MD 08/18/15 [phone]

## 2015-08-17 NOTE — ED Notes (Signed)
Pt transported to Pod C to wait for her daughter to pick her up. Vital signs stable,pt comfortable with discharge instructions,  pt a&o X4. No pain on discharge.

## 2015-08-17 NOTE — Discharge Instructions (Signed)
Cough, Adult A cough helps to clear your throat and lungs. A cough may last only 2-3 weeks (acute), or it may last longer than 8 weeks (chronic). Many different things can cause a cough. A cough may be a sign of an illness or another medical condition. HOME CARE  Pay attention to any changes in your cough.  Take medicines only as told by your doctor.  If you were prescribed an antibiotic medicine, take it as told by your doctor. Do not stop taking it even if you start to feel better.  Talk with your doctor before you try using a cough medicine.  Drink enough fluid to keep your pee (urine) clear or pale yellow.  If the air is dry, use a cold steam vaporizer or humidifier in your home.  Stay away from things that make you cough at work or at home.  If your cough is worse at night, try using extra pillows to raise your head up higher while you sleep.  Do not smoke, and try not to be around smoke. If you need help quitting, ask your doctor.  Do not have caffeine.  Do not drink alcohol.  Rest as needed. GET HELP IF:  You have new problems (symptoms).  You cough up yellow fluid (pus).  Your cough does not get better after 2-3 weeks, or your cough gets worse.  Medicine does not help your cough and you are not sleeping well.  You have pain that gets worse or pain that is not helped with medicine.  You have a fever.  You are losing weight and you do not know why.  You have night sweats. GET HELP RIGHT AWAY IF:  You cough up blood.  You have trouble breathing.  Your heartbeat is very fast.   This information is not intended to replace advice given to you by your health care provider. Make sure you discuss any questions you have with your health care provider.   Document Released: 06/22/2011 Document Revised: 06/30/2015 Document Reviewed: 12/16/2014 Elsevier Interactive Patient Education 2016 [place].   Diarrhea Diarrhea is watery poop (stool). It can make you  feel weak, tired, thirsty, or give you a dry mouth (signs of dehydration). Watery poop is a sign of another problem, most often an infection. It often lasts 2-3 days. It can last longer if it is a sign of something serious. Take care of yourself as told by your doctor. HOME CARE   Drink 1 cup (8 ounces) of fluid each time you have watery poop.  Do not drink the following fluids:  Those that contain simple sugars (fructose, glucose, galactose, lactose, sucrose, maltose).  Sports drinks.  Fruit juices.  Whole milk products.  Sodas.  Drinks with caffeine (coffee, tea, soda) or alcohol.  Oral rehydration solution may be used if the doctor says it is okay. You may make your own solution. Follow this recipe:   - teaspoon table salt.   teaspoon baking soda.   teaspoon salt substitute containing potassium chloride.  1 tablespoons sugar.  1 liter (34 ounces) of water.  Avoid the following foods:  High fiber foods, such as raw fruits and vegetables.  Nuts, seeds, and whole grain breads and cereals.   Those that are sweetened with sugar alcohols (xylitol, sorbitol, mannitol).  Try eating the following foods:  Starchy foods, such as rice, toast, pasta, low-sugar cereal, oatmeal, baked potatoes, crackers, and bagels.  Bananas.  Applesauce.  Eat probiotic-rich foods, such as yogurt and milk products  that are fermented.  Wash your hands well after each time you have watery poop.  Only take medicine as told by your doctor.  Take a warm bath to help lessen burning or pain from having watery poop. GET HELP RIGHT AWAY IF:   You cannot drink fluids without throwing up (vomiting).  You keep throwing up.  You have blood in your poop, or your poop looks black and tarry.  You do not pee (urinate) in 6-8 hours, or there is only a small amount of very dark pee.  You have belly (abdominal) pain that gets worse or stays in the same spot (localizes).  You are weak, dizzy,  confused, or light-headed.  You have a very bad headache.  Your watery poop gets worse or does not get better.  You have a fever or lasting symptoms for more than 2-3 days.  You have a fever and your symptoms suddenly get worse. MAKE SURE YOU:   Understand these instructions.  Will watch your condition.  Will get help right away if you are not doing well or get worse.   This information is not intended to replace advice given to you by your health care provider. Make sure you discuss any questions you have with your health care provider.   Document Released: 03/27/2008 Document Revised: 10/30/2014 Document Reviewed: 06/16/2012 Elsevier Interactive Patient Education [employer].

## 2015-08-17 NOTE — ED Notes (Signed)
Pt ambulated in the hallway approximately 50 feet. No distress noted.

## 2015-08-17 NOTE — ED Notes (Signed)
Pt sitting on stretcher, alert.

## 2015-08-17 NOTE — ED Notes (Signed)
Pt's daughter has arrived to pick up pt per nurse 1st.

## 2015-08-17 NOTE — ED Notes (Signed)
[facility]- pt here from home with c/o URI and diarrhea. She takes Doxycycline at home. Hx of diarrhea from antibiotics. Pt still has URI symptoms including cough. Pt also reports generalized abd pain. Pt took hydrocodone at 0830. Vitals stable with EMS. 4mg  of zofran given.

## 2015-08-25 ENCOUNTER — Encounter: Payer: Self-pay | Admitting: Podiatry

## 2015-08-25 ENCOUNTER — Ambulatory Visit (INDEPENDENT_AMBULATORY_CARE_PROVIDER_SITE_OTHER): Payer: PPO | Admitting: Podiatry

## 2015-08-25 DIAGNOSIS — Q828 Other specified congenital malformations of skin: Secondary | ICD-10-CM

## 2015-08-25 NOTE — Progress Notes (Signed)
Patient ID: Jennifer Patel, female   DOB: 09-03-1932, 81 y.o.   MRN: [id number]   subjective:  this patient presents today complaining of painful calluses on the right and left feet with primary pain sub-fifth right MPJ   Objective:  nucleated keratoses fifth MPJ right first MPJ right   diffuse keratoses first and fifth MPJ left   Assessment:  porokeratosis 2  Keratoses 2   Plan:  dry porokeratosis and keratoses 4 without any bleeding   apply salinocaine to porokeratosis fifth right MPJ  Reappoint 2 months

## 2015-08-25 NOTE — Patient Instructions (Signed)
If possible leave acid bandage on right foot 3-5 days before removing

## 2015-11-03 ENCOUNTER — Ambulatory Visit: Payer: PPO | Admitting: Podiatry

## 2015-11-10 ENCOUNTER — Ambulatory Visit: Payer: PPO | Admitting: Podiatry

## 2015-11-10 DIAGNOSIS — I10 Essential (primary) hypertension: Secondary | ICD-10-CM | POA: Diagnosis not present

## 2015-11-10 DIAGNOSIS — R600 Localized edema: Secondary | ICD-10-CM | POA: Diagnosis not present

## 2015-11-13 DIAGNOSIS — I1A0 Resistant hypertension: Secondary | ICD-10-CM

## 2015-11-13 DIAGNOSIS — I10 Essential (primary) hypertension: Secondary | ICD-10-CM

## 2015-11-13 NOTE — H&P (Signed)
OFFICE VISIT NOTES COPIED TO EPIC FOR DOCUMENTATION  Jennifer Patel 10-22-15 8:22 AM Location: [facility] Patient #: [phone] DOB: [birth year] Undefined / Language: Jennifer Patel / Race: White Female   History of Present Illness Jennifer Page MD; 10/22/2015 6:03 PM) The patient is a 82 year old female who presents for a follow-up for Hypertension. Patient is here 3 week F/U for hypertension. She has history of orthostatic hypotension which is now resolved and had renal insufficiency on high dose of spironolactone and hence spironolactone was reduced.  She is presently tolerating Bystolic without side effects of fatigue. She states that her blood pressure continues to remain markedly elevated with systolic blood pressure greater than 200 mmHg.  She denies any headache, nausea, vomiting, dizziness or syncope. No chest pain, shortness of breath or PND or orthopnea. She has not had any further significant dizziness or near syncopal spells.   Problem List/Past Medical Jennifer Patel; 2015/10/22 2:31 PM) Hyperlipidemia, group A (E78.00) 09/09/2015: Creatinine 0.95, potassium 3.8, BMP normal 05/03/2015: Creatinine 1.12, eGFR 47, potassium 3.9, CMP otherwise normal, total cholesterol 170, triglycerides 92, HDL 78, LDL 74, TSH 7.320, total T4/TUP/free T3 normal, vitamin D 30 Labwork 09/09/2015: Creatinine 0.95, potassium 3.8, BMP normal Labs 08/11/2015: Creatinine 1.46, eGFR 34, sodium 126, potassium 4.9 Labs 07/28/2015: Serum glucose 101 mg, nonfasting. BUN 16, serum creatinine 0.97, eGFR 55 mL. Potassium 4.9. Labs 07/14/2015: Creatinine 0.94, eGFR 57, potassium 4.4 Essential hypertension, benign (I10) Rapid heart rate (R00.0) Event Monitor 10 days 06/11/2015: Symptoms of palpitations, shortness of breath, fatigue reveal normal sinus rhythm. No other arrhythmias. Abnormal EKG (R94.31) Lexiscan myoview stress test 06/14/2015: 1. The resting electrocardiogram demonstrated normal  sinus rhythm, normal resting conduction, no resting arrhythmias and nonspecific ST-T changes inferior leads. Stress EKG is non-diagnostic for ischemia as it a pharmacologic stress using Lexiscan. Stress symptoms included dizziness. 2. Myocardial perfusion imaging is normal. Overall left ventricular systolic function was normal without regional wall motion abnormalities. The left ventricular ejection fraction was 59%. Echo- 06/23/2015 1. Left ventricle cavity is normal in size. Normal global wall motion. Calculated EF 60%. 2. Mild aortic regurgitation. 3. Mild mitral regurgitation. Mild calcification of the mitral valve annulus. 4. Mild tricuspid regurgitation. No evidence of pulmonary hypertension. Shortness of breath on exertion (R06.02) Echo- 06/23/2015 1. Left ventricle cavity is normal in size. Normal global wall motion. Calculated EF 60%. 2. Mild aortic regurgitation. 3. Mild mitral regurgitation. Mild calcification of the mitral valve annulus. 4. Mild tricuspid regurgitation. No evidence of pulmonary hypertension. Lower leg edema (R60.0) Discontinued Amlodipine Orthostatic dizziness (R42) Benign hypertensive heart and kidney disease with diastolic congestive heart failure, NYHA class 2 and chronic kidney disease stage 3 (I13.0) Hypothyroidism (E03.9)  Allergies (Jennifer Patel; 10/22/2015 2:31 PM) Biaxin XL *MACROLIDES* Diarrhea. Tessalon *COUGH/COLD/ALLERGY* Difficulty breathing. Tekturna *ANTIHYPERTENSIVES* Did not work for pt AmLODIPine Besylate *Calcium Channel Blockers**09/29/2015 03:00 PM Edema Metoprolol Succinate ER *BETA BLOCKERS*09/29/2015 03:01 PM Fatigue Chlorthalidone *DIURETICS*09/29/2015 03:01 PM Severe hyponatremia  Family History Jennifer Patel; 10-22-15 2:31 PM) Jennifer Patel Deceased. at age 82 from Unknown reason; Had a Pacemaker Father Deceased. at age 28 from a Stroke; Hx of several Strokes Sister 2 Younger; 1 is paralyzed from a Stroke Siblings 110 Brothers;  Twins died at [employer]; 2-Older, 1-deceased Had CABG, Lung Cancer ; 3-Younger  Social History (Jennifer Patel; 10-22-2015 2:31 PM) Current tobacco use Former smoker. Quit in 1981 Non Drinker/No Alcohol Use Marital status Widowed. Number of Children 3. Living Situation Lives alone.  Past Surgical History Jennifer Macho  Patel; 10/20/2015 2:31 PM) Cholecystectomy Hysterectomy (not due to cancer) - [id number] Bladder [id number]  Medication History Jennifer Patel; 98/92/1194 1:74 PM) Bystolic (08XK Tablet, 1 (one) Tablet Oral daily, Taken starting 09/29/2015) Active. Spironolactone (25MG Tablet,  (one half) Tablet Tablet Tab Oral daily, Taken starting 08/20/2015) Active. Valsartan (320MG Tablet, 1 (one) Tablet Tablet Tablet Oral daily, Taken starting 07/14/2015) Active. (Increased from 191m per BA due to elevated BP.) Allopurinol (100MG Tablet, 2 Oral daily) Active. Aspirin EC (81MG Tablet DR, 1 Oral daily) Active. Atorvastatin Calcium (10MG Tablet, 1 Oral daily) Active. Cyanocobalamin (1000MCG/ML Solution, 1 Injection every 30 days) Active. Lansoprazole (15MG Capsule DR, 1 Oral daily) Active. Synthroid (50MCG Tablet, 1 Oral daily) Active. ZyrTEC (10MG Tablet, 1 Oral daily as needed) Active. Ibuprofen (200MG Tablet, 1 Oral as needed) Active. Tessalon Perles (100MG Capsule, 1 Oral daily as needed) Active. Metoprolol Succinate ER (25MG Tablet ER 24HR, 1 (one) Oral daily, Taken starting 09/29/2015) Active. Medications Reconciled (Verbally with patient.)  Diagnostic Studies History (Jennifer MachoReader; 10/20/2015 2:31 PM) [facility]2011 [facility] Colon[facility]2013 removed 1 benign polyp Endoscopy2013 HiatalHernia CT Scan of Chest03/14/2013 Lower Extremity Dopplers05/16/2015 Left Venous Insufficency Study Event Monitor08/19/2016 10 days; Symptoms of palpitations, shortness of breath, fatigue reveal normal sinus rhythm. No other  arrhythmias. Nuclear stress test08/22/2016 1. The resting electrocardiogram demonstrated normal sinus rhythm, normal resting conduction, no resting arrhythmias and nonspecific ST-T changes inferior leads. Stress EKG is non-diagnostic for ischemia as it a pharmacologic stress using Lexiscan. Stress symptoms included dizziness. 2. Myocardial perfusion imaging is normal. Overall left ventricular systolic function was normal without regional wall motion abnormalities. The left ventricular ejection fraction was 59%. Echocardiogram08/31/2016 1. Left ventricle cavity is normal in size. Normal global wall motion. Calculated EF 60%. 2. Mild aortic regurgitation. 3. Mild mitral regurgitation. Mild calcification of the mitral valve annulus. 4. Mild tricuspid regurgitation. No evidence of pulmonary hypertension.    Review of Systems (Jennifer PageMD; 10/20/2015 6:04 PM) General Present- Feeling well. Not Present- Anorexia, Fatigue and Fever. Respiratory Not Present- Difficulty Breathing on Exertion and Wakes up from Sleep Wheezing or Short of Breath. Cardiovascular Not Present- Claudications, Fainting, Leg Cramps, Orthopnea and Paroxysmal Nocturnal Dyspnea. Gastrointestinal Not Present- Change in Bowel Habits, Constipation and Nausea. Neurological Not Present- Focal Neurological Symptoms. Endocrine Not Present- Appetite Changes, Cold Intolerance and Heat Intolerance. Hematology Not Present- Anemia, Petechiae and Prolonged Bleeding.  Vitals (Jennifer MachoReader; 10/20/2015 2:37 PM) 10/20/2015 2:35 PM Weight: 104.25 lb Height: 58in Body Surface Area: 1.38 m Body Mass Index: 21.79 kg/m  Pulse: 67 (Regular)  P.OX: 97% (Room air) BP: 198/80 (Sitting, Left Arm, Standard)    10/20/2015 2:34 PM Weight: 104.25 lb Height: 58in Body Surface Area: 1.38 m Body Mass Index: 21.79 kg/m  Pulse: 65 (Regular)  P.OX: 97% (Room air) BP: 200/88 (Sitting, Left Arm, Standard)    10/20/2015  2:32 PM Weight: 104.25 lb Height: 58in Body Surface Area: 1.38 m Body Mass Index: 21.79 kg/m  Pulse: 66 (Regular)  P.OX: 96% (Room air) BP: 190/90 (Supine, Left Arm, Standard)       Physical Exam (Jennifer Page MD; 10/20/2015 6:1 PM) General Mental Status-Alert. General Appearance-Cooperative, Appears stated age, Not in acute distress. Orientation-Oriented X3. Build & Nutrition-Lean, Short statured and Petite.  Head and Neck Thyroid Gland Characteristics - no palpable nodules, no palpable enlargement.  Chest and Lung Exam Chest and lung exam reveals -quiet, even and easy respiratory effort with no use of accessory muscles and on auscultation, normal  breath sounds, no adventitious sounds and normal vocal resonance.  Cardiovascular Cardiovascular examination reveals -on palpation PMI is normal in location and amplitude, no palpable S3 or S4. Normal cardiac borders., normal heart sounds, regular rate and rhythm with no murmurs, carotid auscultation reveals no bruits, abdominal aorta auscultation reveals no bruits, femoral artery auscultation bilaterally reveals normal pulses, no bruits, no thrills, normal pedal pulses bilaterally and no digital clubbing, cyanosis, edema, increased warmth or tenderness.  Abdomen Palpation/Percussion Normal exam - Non Tender and No hepatosplenomegaly. Auscultation Normal exam - Bowel sounds normal.  Neurologic Motor-Grossly intact without any focal deficits.  Musculoskeletal - Did not examine.    Assessment & Plan Jennifer Page MD; 10/20/2015 6:04 PM)  Essential hypertension, benign (I10) Current Plans Changed Bystolic 92HV, 1 (one) Tablet daily, #30, 10/20/2015, Ref. x1. Future Plans 7/47/3403: METABOLIC PANEL, BASIC (70964) - one time 11/08/2015: CBC & PLATELETS (AUTO) (38381) - one time Labwork Story: 09/09/2015: Creatinine 0.95, potassium 3.8, BMP normal Labs 08/11/2015: Creatinine 1.46, eGFR 34,  sodium 126, potassium 4.9  Labs 07/28/2015: Serum glucose 101 mg, nonfasting. BUN 16, serum creatinine 0.97, eGFR 55 mL. Potassium 4.9. Labs 07/14/2015: Creatinine 0.94, eGFR 57, potassium 4.4  09/09/2015: Creatinine 0.95, potassium 3.8, BMP normal  05/03/2015: Creatinine 1.12, eGFR 47, potassium 3.9, CMP otherwise normal, total cholesterol 170, triglycerides 92, HDL 78, LDL 74, TSH 7.320, total T4/TUP/free T3 normal, vitamin D 30 Shortness of breath on exertion (R06.02) Story: Echo- 06/23/2015 1. Left ventricle cavity is normal in size. Normal global wall motion. Calculated EF 60%. 2. Mild aortic regurgitation. 3. Mild mitral regurgitation. Mild calcification of the mitral valve annulus. 4. Mild tricuspid regurgitation. No evidence of pulmonary hypertension.  Current Plans: Mechanism of underlying disease process and action of medications discussed with the patient. I discussed primary/secondary prevention and also dietary counceling was done. She is here on a three-week follow-up of hypertension, and needs to have uncontrolled hypertension, patient in spite of being on multiple medications unable to control the blood pressure, I have recommended that she proceed with renal angiography. Previously on diuretics, chlorthalidone, patient had developed acute renal insufficiency and severe hyponatremia. I will see her back after the renal angiogram. I increased Bystolic from 10 mg to 20 mg daily. Addendum Note(Bridgette Allison AGNP-C; 11/10/2015 10:06 PM) 11/10/2015: Creatinine 1.02, potassium 4.4, BMP otherwise normal, CBC normal, PT/INR normal  Labs stable to proceed with renal angiography.     Signed by Jennifer Page, MD (10/20/2015 6:05 PM)

## 2015-11-16 ENCOUNTER — Encounter (HOSPITAL_COMMUNITY)
Admission: RE | Disposition: A | Discharge status: Home / Self Care | Payer: Self-pay | Source: Ambulatory Visit | Attending: Cardiology

## 2015-11-16 ENCOUNTER — Ambulatory Visit (HOSPITAL_COMMUNITY)
Admission: RE | Admit: 2015-11-16 | Discharge: 2015-11-16 | Disposition: A | Discharge status: Home / Self Care | Payer: PPO | Source: Ambulatory Visit | Attending: Cardiology | Admitting: Cardiology

## 2015-11-16 DIAGNOSIS — I130 Hypertensive heart and chronic kidney disease with heart failure and stage 1 through stage 4 chronic kidney disease, or unspecified chronic kidney disease: Secondary | ICD-10-CM | POA: Diagnosis not present

## 2015-11-16 DIAGNOSIS — I5032 Chronic diastolic (congestive) heart failure: Secondary | ICD-10-CM | POA: Insufficient documentation

## 2015-11-16 DIAGNOSIS — I10 Essential (primary) hypertension: Secondary | ICD-10-CM | POA: Diagnosis not present

## 2015-11-16 DIAGNOSIS — Z7982 Long term (current) use of aspirin: Secondary | ICD-10-CM | POA: Diagnosis not present

## 2015-11-16 DIAGNOSIS — N183 Chronic kidney disease, stage 3 (moderate): Secondary | ICD-10-CM | POA: Diagnosis not present

## 2015-11-16 DIAGNOSIS — I700 Atherosclerosis of aorta: Secondary | ICD-10-CM | POA: Insufficient documentation

## 2015-11-16 DIAGNOSIS — Z87891 Personal history of nicotine dependence: Secondary | ICD-10-CM | POA: Diagnosis not present

## 2015-11-16 DIAGNOSIS — I1A0 Resistant hypertension: Secondary | ICD-10-CM

## 2015-11-16 DIAGNOSIS — E039 Hypothyroidism, unspecified: Secondary | ICD-10-CM | POA: Insufficient documentation

## 2015-11-16 DIAGNOSIS — E7800 Pure hypercholesterolemia, unspecified: Secondary | ICD-10-CM | POA: Diagnosis not present

## 2015-11-16 HISTORY — PX: PERIPHERAL VASCULAR CATHETERIZATION: SHX172C

## 2015-11-16 SURGERY — RENAL ANGIOGRAPHY
Anesthesia: LOCAL

## 2015-11-16 MED ORDER — HYDRALAZINE HCL 25 MG PO TABS: 25.0000 mg | ORAL_TABLET | Freq: Three times a day (TID) | ORAL | Status: DC

## 2015-11-16 MED ORDER — FENTANYL CITRATE (PF) 100 MCG/2ML IJ SOLN
INTRAMUSCULAR | Status: DC | PRN
  Administered 2015-11-16: 25 ug via INTRAVENOUS

## 2015-11-16 MED ORDER — HYDRALAZINE HCL 20 MG/ML IJ SOLN
INTRAMUSCULAR | Status: AC
  Filled 2015-11-16: qty 1

## 2015-11-16 MED ORDER — HYDRALAZINE HCL 20 MG/ML IJ SOLN
INTRAMUSCULAR | Status: DC | PRN
  Administered 2015-11-16: 10 mg via INTRAVENOUS

## 2015-11-16 MED ORDER — FENTANYL CITRATE (PF) 100 MCG/2ML IJ SOLN
INTRAMUSCULAR | Status: AC
  Filled 2015-11-16: qty 2

## 2015-11-16 MED ORDER — MIDAZOLAM HCL 2 MG/2ML IJ SOLN
INTRAMUSCULAR | Status: DC | PRN
  Administered 2015-11-16: 1 mg via INTRAVENOUS

## 2015-11-16 MED ORDER — LABETALOL HCL 5 MG/ML IV SOLN
INTRAVENOUS | Status: AC
  Filled 2015-11-16: qty 4

## 2015-11-16 MED ORDER — SODIUM CHLORIDE 0.9 % IJ SOLN: 3.0000 mL | Freq: Two times a day (BID) | INTRAMUSCULAR | Status: DC

## 2015-11-16 MED ORDER — SODIUM CHLORIDE 0.9 % IV SOLN
INTRAVENOUS | Status: DC
  Administered 2015-11-16: 10:00:00 via INTRAVENOUS

## 2015-11-16 MED ORDER — SODIUM CHLORIDE 0.9 % IJ SOLN: 3.0000 mL | INTRAMUSCULAR | Status: DC | PRN

## 2015-11-16 MED ORDER — SODIUM CHLORIDE 0.9 % IV SOLN
INTRAVENOUS | Status: DC | PRN
  Administered 2015-11-16: 250 mL via INTRAVENOUS

## 2015-11-16 MED ORDER — SODIUM CHLORIDE 0.9 % IV SOLN: 250.0000 mL | INTRAVENOUS | Status: DC | PRN

## 2015-11-16 MED ORDER — LABETALOL HCL 5 MG/ML IV SOLN
INTRAVENOUS | Status: DC | PRN
  Administered 2015-11-16: 15 mg via INTRAVENOUS

## 2015-11-16 MED ORDER — SODIUM CHLORIDE 0.9 % IV SOLN
1.0000 mL/kg/h | INTRAVENOUS | Status: DC
Start: 2015-11-16 — End: 2015-11-16

## 2015-11-16 MED ORDER — LIDOCAINE HCL (PF) 1 % IJ SOLN
INTRAMUSCULAR | Status: DC | PRN
  Administered 2015-11-16: 13:00:00

## 2015-11-16 MED ORDER — MIDAZOLAM HCL 2 MG/2ML IJ SOLN
INTRAMUSCULAR | Status: AC
  Filled 2015-11-16: qty 2

## 2015-11-16 MED ORDER — LIDOCAINE HCL (PF) 1 % IJ SOLN
INTRAMUSCULAR | Status: AC
  Filled 2015-11-16: qty 30

## 2015-11-16 MED ORDER — HEPARIN (PORCINE) IN NACL 2-0.9 UNIT/ML-% IJ SOLN
INTRAMUSCULAR | Status: AC
  Filled 2015-11-16: qty 1000

## 2015-11-16 SURGICAL SUPPLY — 10 items
CATH OMNI FLUSH 5F 65CM (CATHETERS) ×2 IMPLANT
DEVICE CLOSURE PERCLS PRGLD 6F (VASCULAR PRODUCTS) ×1 IMPLANT
KIT MICROINTRODUCER STIFF 5F (SHEATH) ×2 IMPLANT
KIT PV (KITS) ×2 IMPLANT
PERCLOSE PROGLIDE 6F (VASCULAR PRODUCTS) ×2
SHEATH PINNACLE 6F 10CM (SHEATH) ×2 IMPLANT
SYR MEDRAD MARK V 150ML (SYRINGE) ×2 IMPLANT
TRANSDUCER W/STOPCOCK (MISCELLANEOUS) ×2 IMPLANT
TRAY PV CATH (CUSTOM PROCEDURE TRAY) ×2 IMPLANT
WIRE HITORQ VERSACORE ST 145CM (WIRE) ×2 IMPLANT

## 2015-11-16 NOTE — Interval H&P Note (Signed)
History and Physical Interval Note:  11/16/2015 12:26 PM  Jennifer Patel  has presented today for surgery, with the diagnosis of hypertension  The various methods of treatment have been discussed with the patient and family. After consideration of risks, benefits and other options for treatment, the patient has consented to  Procedure(s): Renal Angiography and possible PTA  (N/A) as a surgical intervention . Patient was inability to tolerate diuretics, due to severe hyponatremia and renal failure, inability to tolerate amlodipine due to severe leg edema, continues to have uncontrolled hypertension, patient has history of extensive atherosclerotic changes of the abdominal aorta by CT angiogram about 6-8 years ago, I suspect she probably has severe atherosclerosis involving the renal arteries. The patient's history has been reviewed, patient examined, no change in status, stable for surgery.  I have reviewed the patient's chart and labs.  Questions were answered to the patient's satisfaction.     Lujean Amel, Laurabeth Yip

## 2015-11-16 NOTE — Discharge Instructions (Signed)
Angiogram, Care After °Refer to this sheet in the next few weeks. These instructions provide you with information about caring for yourself after your procedure. Your health care provider may also give you more specific instructions. Your treatment has been planned according to current medical practices, but problems sometimes occur. Call your health care provider if you have any problems or questions after your procedure. °WHAT TO EXPECT AFTER THE PROCEDURE °After your procedure, it is typical to have the following: °· Bruising at the catheter insertion site that usually fades within 1-2 weeks. °· Blood collecting in the tissue (hematoma) that may be painful to the touch. It should usually decrease in size and tenderness within 1-2 weeks. °HOME CARE INSTRUCTIONS °· Take medicines only as directed by your health care provider. °· You may shower 24-48 hours after the procedure or as directed by your health care provider. Remove the bandage (dressing) and gently wash the site with plain soap and water. Pat the area dry with a clean towel. Do not rub the site, because this may cause bleeding. °· Do not take baths, swim, or use a hot tub until your health care provider approves. °· Check your insertion site every day for redness, swelling, or drainage. °· Do not apply powder or lotion to the site. °· Do not lift over 10 lb (4.5 kg) for 5 days after your procedure or as directed by your health care provider. °· Ask your health care provider when it is okay to: °¨ Return to work or school. °¨ Resume usual physical activities or sports. °¨ Resume sexual activity. °· Do not drive home if you are discharged the same day as the procedure. Have someone else drive you. °· You may drive 24 hours after the procedure unless otherwise instructed by your health care provider. °· Do not operate machinery or power tools for 24 hours after the procedure or as directed by your health care provider. °· If your procedure was done as an  outpatient procedure, which means that you went home the same day as your procedure, a responsible adult should be with you for the first 24 hours after you arrive home. °· Keep all follow-up visits as directed by your health care provider. This is important. °SEEK MEDICAL CARE IF: °· You have a fever. °· You have chills. °· You have increased bleeding from the catheter insertion site. Hold pressure on the site. °SEEK IMMEDIATE MEDICAL CARE IF: °· You have unusual pain at the catheter insertion site. °· You have redness, warmth, or swelling at the catheter insertion site. °· You have drainage (other than a small amount of blood on the dressing) from the catheter insertion site. °· The catheter insertion site is bleeding, and the bleeding does not stop after 30 minutes of holding steady pressure on the site. °· The area near or just beyond the catheter insertion site becomes pale, cool, tingly, or numb. °  °This information is not intended to replace advice given to you by your health care provider. Make sure you discuss any questions you have with your health care provider. °  °Document Released: 04/27/2005 Document Revised: 10/30/2014 Document Reviewed: 03/12/2013 °Elsevier Interactive Patient Education ©2016 [employer] ° °

## 2015-11-17 ENCOUNTER — Encounter (HOSPITAL_COMMUNITY): Payer: Self-pay | Admitting: Cardiology

## 2015-11-22 DIAGNOSIS — E538 Deficiency of other specified B group vitamins: Secondary | ICD-10-CM | POA: Diagnosis not present

## 2015-11-24 DIAGNOSIS — I10 Essential (primary) hypertension: Secondary | ICD-10-CM | POA: Diagnosis not present

## 2015-11-24 DIAGNOSIS — R0602 Shortness of breath: Secondary | ICD-10-CM | POA: Diagnosis not present

## 2015-11-24 DIAGNOSIS — R0989 Other specified symptoms and signs involving the circulatory and respiratory systems: Secondary | ICD-10-CM | POA: Diagnosis not present

## 2015-11-24 DIAGNOSIS — K5909 Other constipation: Secondary | ICD-10-CM | POA: Diagnosis not present

## 2015-11-25 DIAGNOSIS — I724 Aneurysm of artery of lower extremity: Secondary | ICD-10-CM | POA: Diagnosis not present

## 2015-12-01 ENCOUNTER — Encounter: Payer: Self-pay | Admitting: Podiatry

## 2015-12-01 ENCOUNTER — Ambulatory Visit (INDEPENDENT_AMBULATORY_CARE_PROVIDER_SITE_OTHER): Payer: PPO | Admitting: Podiatry

## 2015-12-01 DIAGNOSIS — Q828 Other specified congenital malformations of skin: Secondary | ICD-10-CM

## 2015-12-02 NOTE — Progress Notes (Signed)
Patient ID: Jennifer Patel, female   DOB: 24-May-1932, 82 y.o.   MRN: [id number]  Subjective: This patient presents for ongoing debridement at approximately 8 week intervals for painful porokeratosis on the right and left feet  Objective: Orientated 3 No open skin lesions bilaterally Nucleated keratoses sub-first MPJ and fifth MPJ right Diffuse keratoses first and fifth MPJ left  Assessment: Porokeratosis 2 Keratoses 2  Plan: Debrided keratoses 4 without a bleeding Apply salinocaine to porokeratosis first and fifth MPJ right  Reappoint 2 months

## 2015-12-09 DIAGNOSIS — E032 Hypothyroidism due to medicaments and other exogenous substances: Secondary | ICD-10-CM | POA: Diagnosis not present

## 2015-12-09 DIAGNOSIS — E789 Disorder of lipoprotein metabolism, unspecified: Secondary | ICD-10-CM | POA: Diagnosis not present

## 2015-12-09 DIAGNOSIS — E790 Hyperuricemia without signs of inflammatory arthritis and tophaceous disease: Secondary | ICD-10-CM | POA: Diagnosis not present

## 2015-12-09 DIAGNOSIS — I10 Essential (primary) hypertension: Secondary | ICD-10-CM | POA: Diagnosis not present

## 2015-12-16 DIAGNOSIS — I10 Essential (primary) hypertension: Secondary | ICD-10-CM | POA: Diagnosis not present

## 2015-12-16 DIAGNOSIS — R0989 Other specified symptoms and signs involving the circulatory and respiratory systems: Secondary | ICD-10-CM | POA: Diagnosis not present

## 2015-12-22 DIAGNOSIS — R05 Cough: Secondary | ICD-10-CM | POA: Diagnosis not present

## 2015-12-22 DIAGNOSIS — E032 Hypothyroidism due to medicaments and other exogenous substances: Secondary | ICD-10-CM | POA: Diagnosis not present

## 2015-12-22 DIAGNOSIS — M109 Gout, unspecified: Secondary | ICD-10-CM | POA: Diagnosis not present

## 2015-12-22 DIAGNOSIS — I10 Essential (primary) hypertension: Secondary | ICD-10-CM | POA: Diagnosis not present

## 2016-01-06 DIAGNOSIS — Z1231 Encounter for screening mammogram for malignant neoplasm of breast: Secondary | ICD-10-CM | POA: Diagnosis not present

## 2016-01-06 DIAGNOSIS — I10 Essential (primary) hypertension: Secondary | ICD-10-CM | POA: Diagnosis not present

## 2016-01-13 DIAGNOSIS — I10 Essential (primary) hypertension: Secondary | ICD-10-CM | POA: Diagnosis not present

## 2016-01-26 DIAGNOSIS — E538 Deficiency of other specified B group vitamins: Secondary | ICD-10-CM | POA: Diagnosis not present

## 2016-02-01 DIAGNOSIS — M25561 Pain in right knee: Secondary | ICD-10-CM | POA: Diagnosis not present

## 2016-02-09 ENCOUNTER — Ambulatory Visit: Payer: PPO | Admitting: Podiatry

## 2016-02-09 DIAGNOSIS — T50905A Adverse effect of unspecified drugs, medicaments and biological substances, initial encounter: Secondary | ICD-10-CM | POA: Diagnosis not present

## 2016-02-09 DIAGNOSIS — R197 Diarrhea, unspecified: Secondary | ICD-10-CM | POA: Diagnosis not present

## 2016-02-15 DIAGNOSIS — R197 Diarrhea, unspecified: Secondary | ICD-10-CM | POA: Diagnosis not present

## 2016-02-25 DIAGNOSIS — E538 Deficiency of other specified B group vitamins: Secondary | ICD-10-CM | POA: Diagnosis not present

## 2016-02-28 DIAGNOSIS — M1000 Idiopathic gout, unspecified site: Secondary | ICD-10-CM | POA: Diagnosis not present

## 2016-02-28 DIAGNOSIS — R51 Headache: Secondary | ICD-10-CM | POA: Diagnosis not present

## 2016-02-28 DIAGNOSIS — E789 Disorder of lipoprotein metabolism, unspecified: Secondary | ICD-10-CM | POA: Diagnosis not present

## 2016-02-28 DIAGNOSIS — E0789 Other specified disorders of thyroid: Secondary | ICD-10-CM | POA: Diagnosis not present

## 2016-02-28 DIAGNOSIS — I10 Essential (primary) hypertension: Secondary | ICD-10-CM | POA: Diagnosis not present

## 2016-03-21 DIAGNOSIS — I10 Essential (primary) hypertension: Secondary | ICD-10-CM | POA: Diagnosis not present

## 2016-03-21 DIAGNOSIS — E789 Disorder of lipoprotein metabolism, unspecified: Secondary | ICD-10-CM | POA: Diagnosis not present

## 2016-03-21 DIAGNOSIS — E0789 Other specified disorders of thyroid: Secondary | ICD-10-CM | POA: Diagnosis not present

## 2016-03-24 DIAGNOSIS — E538 Deficiency of other specified B group vitamins: Secondary | ICD-10-CM | POA: Diagnosis not present

## 2016-03-28 DIAGNOSIS — I10 Essential (primary) hypertension: Secondary | ICD-10-CM | POA: Diagnosis not present

## 2016-03-28 DIAGNOSIS — R112 Nausea with vomiting, unspecified: Secondary | ICD-10-CM | POA: Diagnosis not present

## 2016-03-28 DIAGNOSIS — E032 Hypothyroidism due to medicaments and other exogenous substances: Secondary | ICD-10-CM | POA: Diagnosis not present

## 2016-03-28 DIAGNOSIS — R197 Diarrhea, unspecified: Secondary | ICD-10-CM | POA: Diagnosis not present

## 2016-04-24 DIAGNOSIS — E538 Deficiency of other specified B group vitamins: Secondary | ICD-10-CM | POA: Diagnosis not present

## 2016-05-24 DIAGNOSIS — E559 Vitamin D deficiency, unspecified: Secondary | ICD-10-CM | POA: Diagnosis not present

## 2016-05-24 DIAGNOSIS — E789 Disorder of lipoprotein metabolism, unspecified: Secondary | ICD-10-CM | POA: Diagnosis not present

## 2016-05-24 DIAGNOSIS — I10 Essential (primary) hypertension: Secondary | ICD-10-CM | POA: Diagnosis not present

## 2016-05-26 DIAGNOSIS — E538 Deficiency of other specified B group vitamins: Secondary | ICD-10-CM | POA: Diagnosis not present

## 2016-05-31 DIAGNOSIS — R197 Diarrhea, unspecified: Secondary | ICD-10-CM | POA: Diagnosis not present

## 2016-05-31 DIAGNOSIS — I10 Essential (primary) hypertension: Secondary | ICD-10-CM | POA: Diagnosis not present

## 2016-05-31 DIAGNOSIS — E789 Disorder of lipoprotein metabolism, unspecified: Secondary | ICD-10-CM | POA: Diagnosis not present

## 2016-06-05 DIAGNOSIS — Z961 Presence of intraocular lens: Secondary | ICD-10-CM | POA: Diagnosis not present

## 2016-06-21 ENCOUNTER — Ambulatory Visit (INDEPENDENT_AMBULATORY_CARE_PROVIDER_SITE_OTHER): Payer: PPO | Admitting: Podiatry

## 2016-06-21 DIAGNOSIS — Q828 Other specified congenital malformations of skin: Secondary | ICD-10-CM

## 2016-06-22 NOTE — Progress Notes (Signed)
Patient ID: Jennifer Patel, female   DOB: Mar 13, 1932, 82 y.o.   MRN: [id number]     Subjective: This patient presents for ongoing debridement at approximately 8 week intervals for painful porokeratosis on the right and left feet  Objective: Orientated 3 No open skin lesions bilaterally Nucleated keratoses sub-first MPJ and fifth MPJ right Diffuse keratoses first and fifth MPJ left  Assessment: Porokeratosis 2 Keratoses 2  Plan: Debrided keratoses 4 without a bleeding Apply salinocaine to porokeratosis first and fifth MPJ right  Reappoint 3 months

## 2016-06-27 DIAGNOSIS — E538 Deficiency of other specified B group vitamins: Secondary | ICD-10-CM | POA: Diagnosis not present

## 2016-07-07 LAB — GLUCOSE, POCT (MANUAL RESULT ENTRY): POC Glucose: 99 mg/dl (ref 70–99)

## 2016-07-25 DIAGNOSIS — E538 Deficiency of other specified B group vitamins: Secondary | ICD-10-CM | POA: Diagnosis not present

## 2016-07-25 DIAGNOSIS — Z23 Encounter for immunization: Secondary | ICD-10-CM | POA: Diagnosis not present

## 2016-07-27 IMAGING — CT CT NECK W/O CM
3 of 5 series · 14 of 33 positions shown, 17 images · non-contrast
Comparison: Head CT 06/23/2008 and cervical spine CT 12/24/2010

CLINICAL DATA: Neck pain since 05/13/2015. Given prednisone and
today started having tightness and swelling in throat. Neck pain
persists. Chronic kidney problems.

EXAM:
CT NECK WITHOUT CONTRAST
TECHNIQUE: Multidetector CT imaging of the neck was performed following the
standard protocol without intravenous contrast.

[Series 5: coronal st · coronal · 0.27mm/px · 3 of 95 slices shown]
[im 19/95  bone]
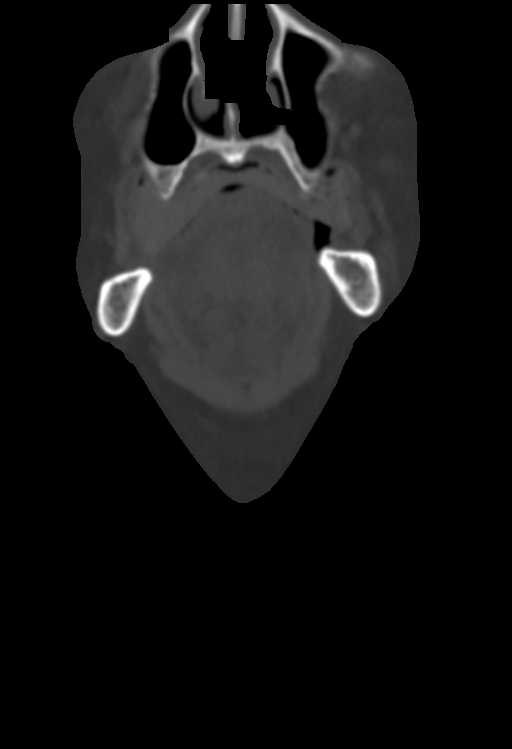
[im 38/95  bone]
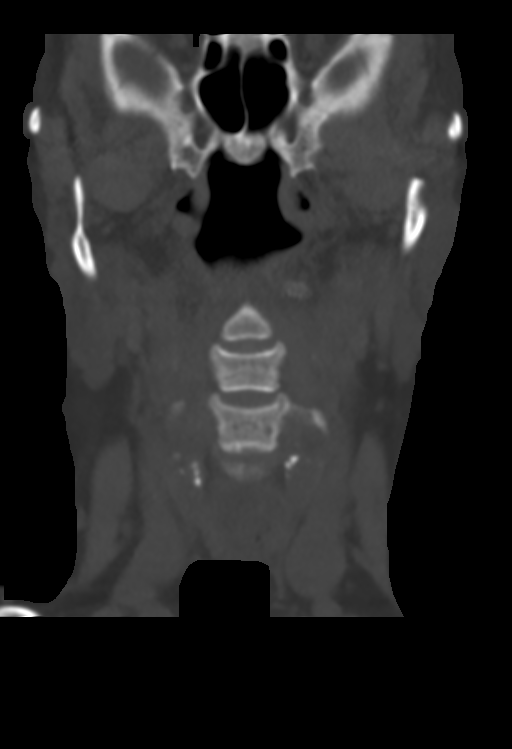
[im 57/95  bone]
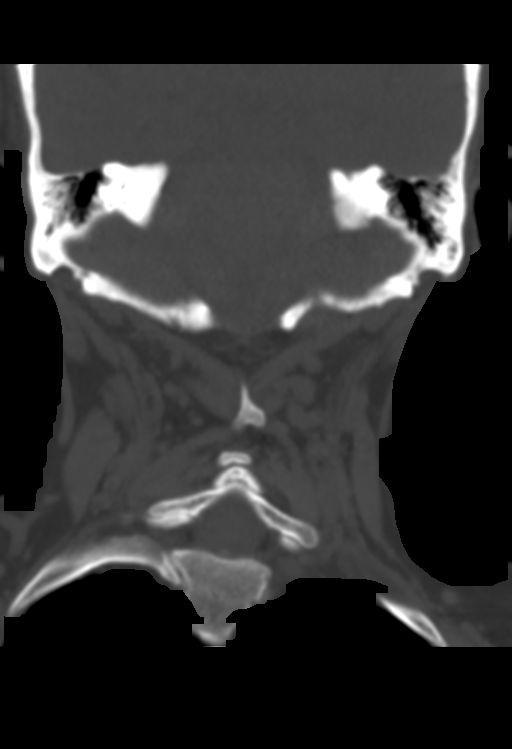

[Series 6: sagittal st · sagittal · 0.35mm/px · 5 of 65 slices shown, 6 images]
[im 22/65  bone]
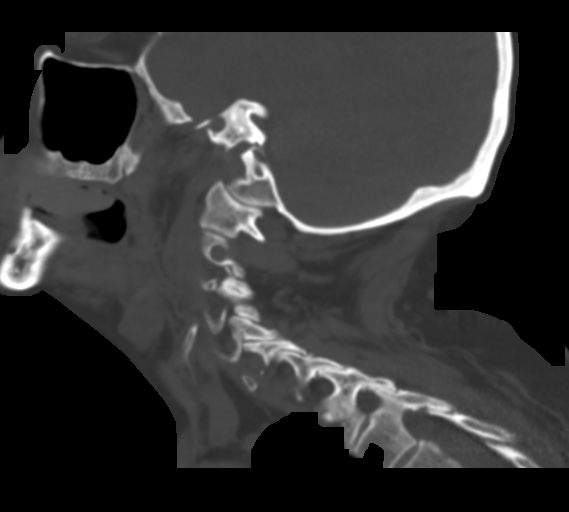
[im 27/65  bone]
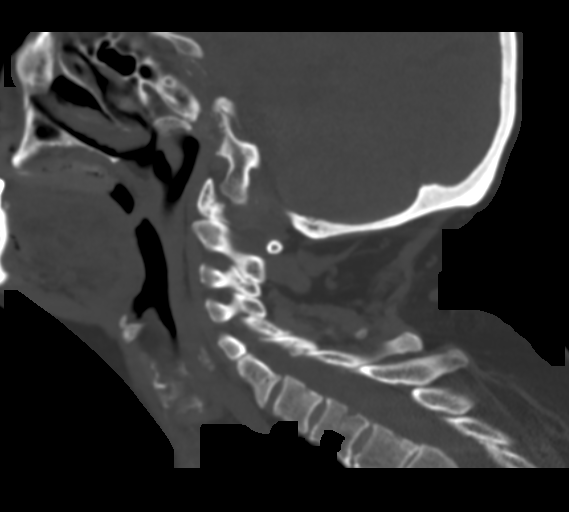
[im 33/65  soft-tissue]
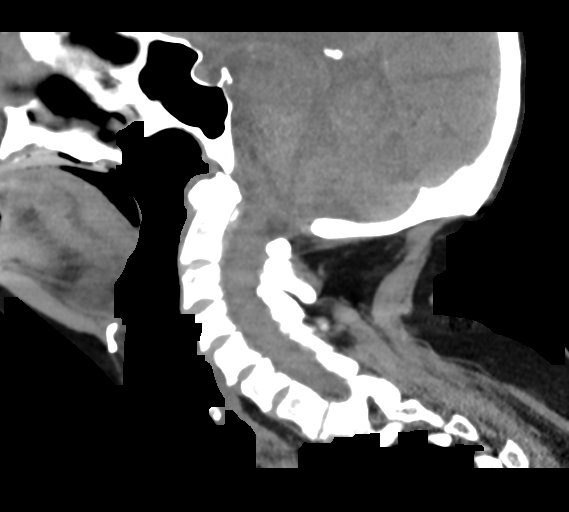
[im 33/65  bone]
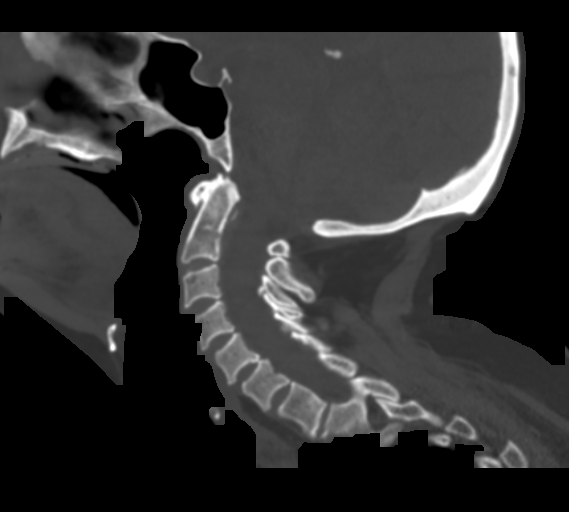
[im 38/65  bone]
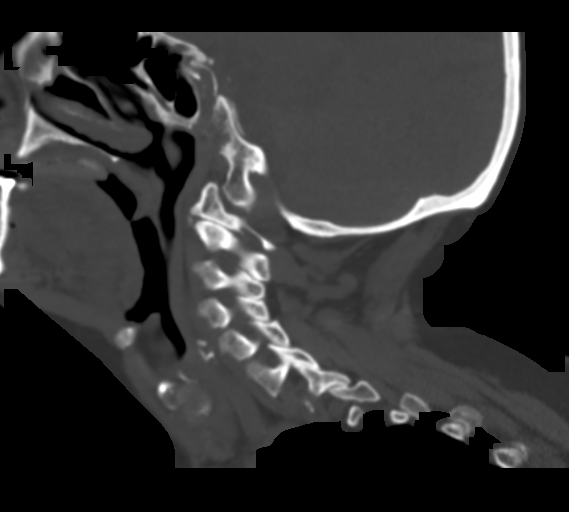
[im 43/65  bone]
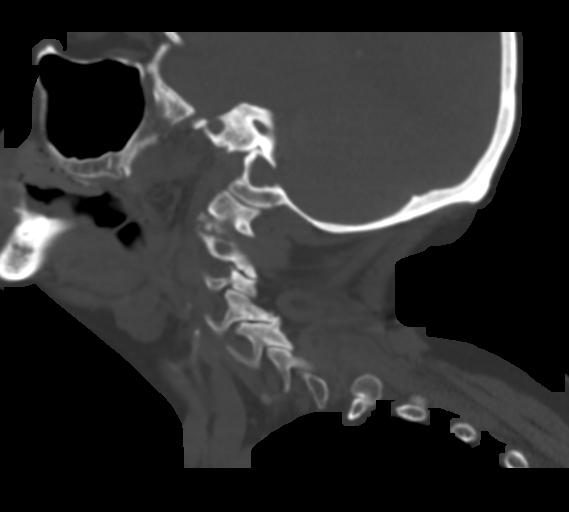

[Series 9: 1.0 thins · axial · 0.41mm/px · z∈[-340,-226]mm · 6 of 204 slices shown, 8 images]
[im 26/204  soft-tissue]
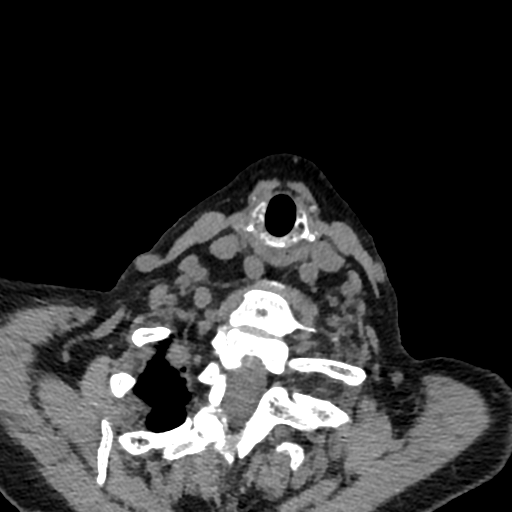
[im 26/204  bone]
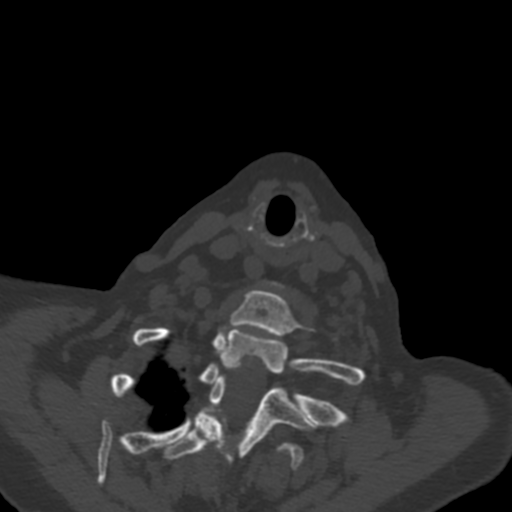
[im 51/204  bone]
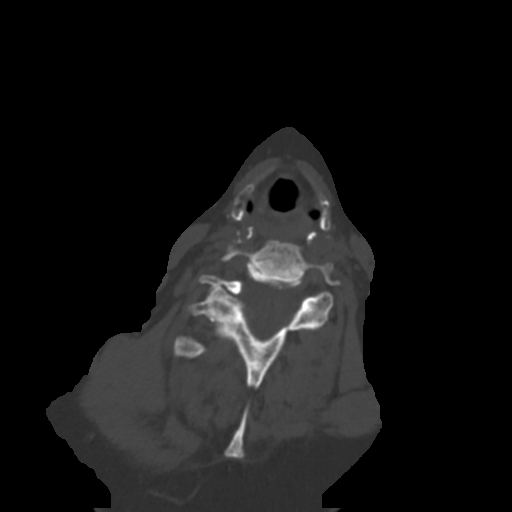
[im 77/204  bone]
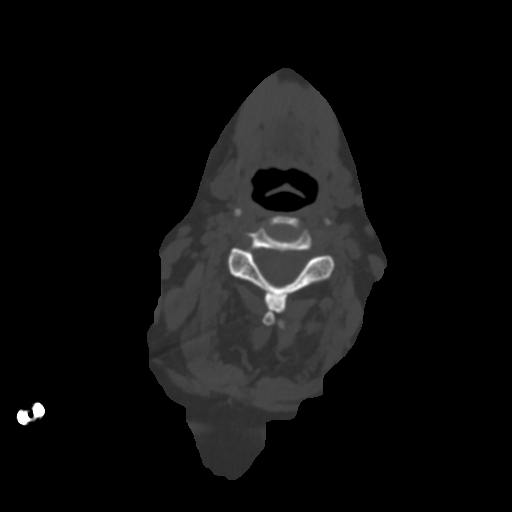
[im 127/204  bone]
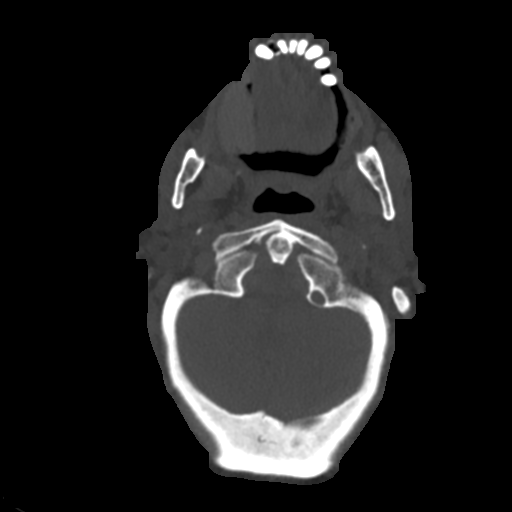
[im 153/204  soft-tissue]
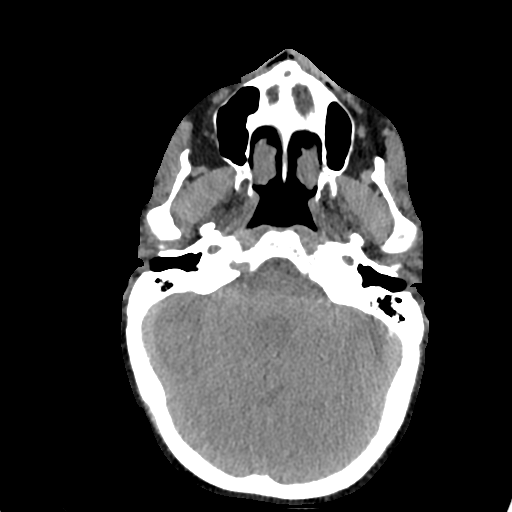
[im 153/204  bone]
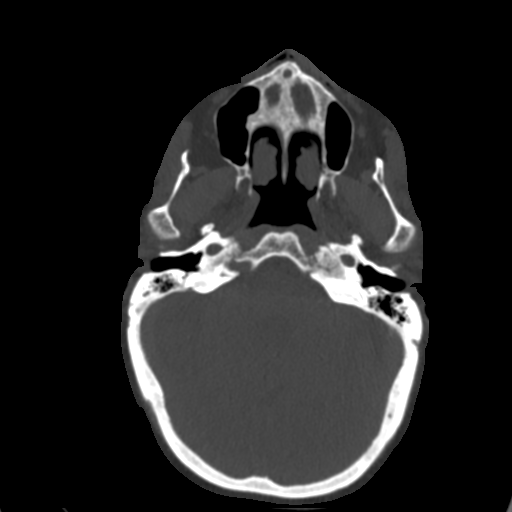
[im 178/204  bone]
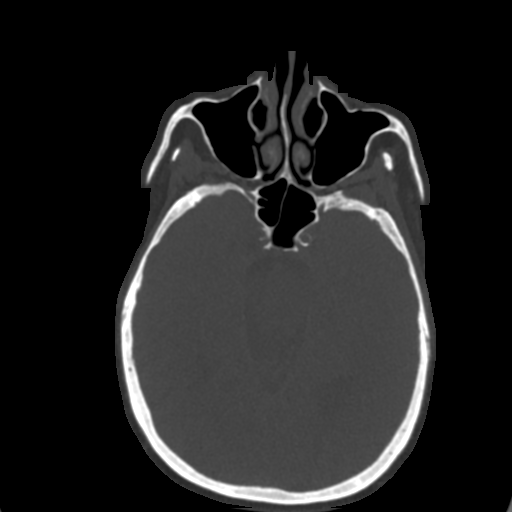

[14 of 33 positions shown; findings below may reference images not displayed]

FINDINGS: Examination demonstrates the spaces of the suprahyoid neck to be
within normal without focal mass, adenopathy, fluid collection or
inflammatory change. Airway is patent. Epiglottis and subglottic
airway are normal. No upper teeth are present. Several dental caries
over the visualized lower teeth. No evidence of periodontal disease.

Visualized orbits and paranasal sinuses are within normal. Mastoid
air cells are clear. Salivary glands are normal and symmetric.

Infrahyoid neck demonstrates minimal thyroid tissue as otherwise
within normal. There is moderate calcified plaque over the carotid
bifurcations bilaterally. Lung apices are normal. There is mild
spondylosis of the cervical spine.
IMPRESSION: No acute findings.

## 2016-08-15 DIAGNOSIS — E789 Disorder of lipoprotein metabolism, unspecified: Secondary | ICD-10-CM | POA: Diagnosis not present

## 2016-08-15 DIAGNOSIS — Z0000 Encounter for general adult medical examination without abnormal findings: Secondary | ICD-10-CM | POA: Diagnosis not present

## 2016-08-15 DIAGNOSIS — M810 Age-related osteoporosis without current pathological fracture: Secondary | ICD-10-CM | POA: Diagnosis not present

## 2016-08-15 DIAGNOSIS — I10 Essential (primary) hypertension: Secondary | ICD-10-CM | POA: Diagnosis not present

## 2016-08-15 DIAGNOSIS — E039 Hypothyroidism, unspecified: Secondary | ICD-10-CM | POA: Diagnosis not present

## 2016-08-15 DIAGNOSIS — N390 Urinary tract infection, site not specified: Secondary | ICD-10-CM | POA: Diagnosis not present

## 2016-08-15 DIAGNOSIS — E0789 Other specified disorders of thyroid: Secondary | ICD-10-CM | POA: Diagnosis not present

## 2016-08-22 DIAGNOSIS — E789 Disorder of lipoprotein metabolism, unspecified: Secondary | ICD-10-CM | POA: Diagnosis not present

## 2016-08-22 DIAGNOSIS — I10 Essential (primary) hypertension: Secondary | ICD-10-CM | POA: Diagnosis not present

## 2016-08-22 DIAGNOSIS — E032 Hypothyroidism due to medicaments and other exogenous substances: Secondary | ICD-10-CM | POA: Diagnosis not present

## 2016-08-28 DIAGNOSIS — E538 Deficiency of other specified B group vitamins: Secondary | ICD-10-CM | POA: Diagnosis not present

## 2016-09-27 ENCOUNTER — Ambulatory Visit (INDEPENDENT_AMBULATORY_CARE_PROVIDER_SITE_OTHER): Payer: PPO | Admitting: Podiatry

## 2016-09-27 ENCOUNTER — Encounter: Payer: Self-pay | Admitting: Podiatry

## 2016-09-27 DIAGNOSIS — Q828 Other specified congenital malformations of skin: Secondary | ICD-10-CM | POA: Diagnosis not present

## 2016-09-28 DIAGNOSIS — E538 Deficiency of other specified B group vitamins: Secondary | ICD-10-CM | POA: Diagnosis not present

## 2016-09-28 NOTE — Progress Notes (Signed)
Patient ID: Jennifer Patel, female   DOB: [birth year], 82 y.o.   MRN: [id number]   Subjective: This patient presents for ongoing debridement at approximately 8 week intervals for painful porokeratosis on the right and left feet  Objective: Orientated 3 DP and PT pulses 2/4 bilaterally Capillary reflex immediate bilaterally Sensation to 10 g monofilament wire intact 5/5 bilaterally Vibratory sensation reactive bilaterally Ankle reflex equal and reactive bilaterally No open skin lesions bilaterally Atrophic skin bilaterally Nucleated keratoses sub-first MPJ and fifth MPJ right Diffuse keratoses first and fifth MPJ left HAV left Hammertoe second left  Assessment: Porokeratosis 2 Keratoses 2  Plan: Debrided keratoses 4 without a bleeding Apply salinocaine to porokeratosis first and fifth MPJ right  Reappoint 3 months

## 2016-10-12 DIAGNOSIS — Z79899 Other long term (current) drug therapy: Secondary | ICD-10-CM | POA: Diagnosis not present

## 2016-10-12 DIAGNOSIS — I10 Essential (primary) hypertension: Secondary | ICD-10-CM | POA: Diagnosis not present

## 2016-10-24 DIAGNOSIS — E789 Disorder of lipoprotein metabolism, unspecified: Secondary | ICD-10-CM | POA: Diagnosis not present

## 2016-10-24 DIAGNOSIS — I10 Essential (primary) hypertension: Secondary | ICD-10-CM | POA: Diagnosis not present

## 2016-10-24 DIAGNOSIS — E032 Hypothyroidism due to medicaments and other exogenous substances: Secondary | ICD-10-CM | POA: Diagnosis not present

## 2016-10-30 DIAGNOSIS — E538 Deficiency of other specified B group vitamins: Secondary | ICD-10-CM | POA: Diagnosis not present

## 2016-11-30 DIAGNOSIS — E538 Deficiency of other specified B group vitamins: Secondary | ICD-10-CM | POA: Diagnosis not present

## 2016-12-08 DIAGNOSIS — L988 Other specified disorders of the skin and subcutaneous tissue: Secondary | ICD-10-CM | POA: Diagnosis not present

## 2016-12-27 ENCOUNTER — Ambulatory Visit (INDEPENDENT_AMBULATORY_CARE_PROVIDER_SITE_OTHER): Payer: PPO | Admitting: Podiatry

## 2016-12-27 ENCOUNTER — Encounter: Payer: Self-pay | Admitting: Podiatry

## 2016-12-27 VITALS — BP 172/66 | HR 61 | Resp 18

## 2016-12-27 DIAGNOSIS — Q828 Other specified congenital malformations of skin: Secondary | ICD-10-CM

## 2016-12-27 NOTE — Progress Notes (Signed)
Patient ID: KRYSSA RISENHOOVER, female   DOB: Aug 15, 1932, 83 y.o.   MRN: [id number]    Subjective: This patient presents for ongoing debridement at approximately 8 week intervals for painful porokeratosis on the right and left feet  Objective: Orientated 3 DP and PT pulses 2/4 bilaterally Capillary reflex immediate bilaterally Sensation to 10 g monofilament wire intact 5/5 bilaterally Vibratory sensation reactive bilaterally Ankle reflex equal and reactive bilaterally No open skin lesions bilaterally Atrophic skin bilaterally Atrophic fat-pad MPJs bilaterally Nucleated keratoses sub-first MPJ and fifth MPJ right Diffuse keratoses first and fifth MPJ left HAV left Hammertoe second left  Assessment: Porokeratosis 2 Keratoses 2  Plan: Debrided keratoses 4 without a bleeding Apply salinocaine to porokeratosis first and fifth MPJ right  Reappoint 61months

## 2016-12-28 DIAGNOSIS — E538 Deficiency of other specified B group vitamins: Secondary | ICD-10-CM | POA: Diagnosis not present

## 2017-01-15 DIAGNOSIS — Z1231 Encounter for screening mammogram for malignant neoplasm of breast: Secondary | ICD-10-CM | POA: Diagnosis not present

## 2017-01-29 DIAGNOSIS — E538 Deficiency of other specified B group vitamins: Secondary | ICD-10-CM | POA: Diagnosis not present

## 2017-01-31 DIAGNOSIS — I10 Essential (primary) hypertension: Secondary | ICD-10-CM | POA: Diagnosis not present

## 2017-01-31 DIAGNOSIS — E789 Disorder of lipoprotein metabolism, unspecified: Secondary | ICD-10-CM | POA: Diagnosis not present

## 2017-02-07 DIAGNOSIS — M25519 Pain in unspecified shoulder: Secondary | ICD-10-CM | POA: Diagnosis not present

## 2017-02-07 DIAGNOSIS — M19011 Primary osteoarthritis, right shoulder: Secondary | ICD-10-CM | POA: Diagnosis not present

## 2017-02-07 DIAGNOSIS — M25511 Pain in right shoulder: Secondary | ICD-10-CM | POA: Diagnosis not present

## 2017-02-12 DIAGNOSIS — M1990 Unspecified osteoarthritis, unspecified site: Secondary | ICD-10-CM | POA: Diagnosis not present

## 2017-02-12 DIAGNOSIS — M7581 Other shoulder lesions, right shoulder: Secondary | ICD-10-CM | POA: Diagnosis not present

## 2017-02-12 DIAGNOSIS — M25511 Pain in right shoulder: Secondary | ICD-10-CM | POA: Diagnosis not present

## 2017-03-01 DIAGNOSIS — E538 Deficiency of other specified B group vitamins: Secondary | ICD-10-CM | POA: Diagnosis not present

## 2017-03-29 ENCOUNTER — Ambulatory Visit (INDEPENDENT_AMBULATORY_CARE_PROVIDER_SITE_OTHER): Payer: PPO | Admitting: Podiatry

## 2017-03-29 ENCOUNTER — Encounter: Payer: Self-pay | Admitting: Podiatry

## 2017-03-29 DIAGNOSIS — Q828 Other specified congenital malformations of skin: Secondary | ICD-10-CM | POA: Diagnosis not present

## 2017-03-29 NOTE — Progress Notes (Signed)
Complaint:  Visit Type: Patient returns to my office for continued preventative foot care services. Complaint: Patient states" my calluses have become thick and painful to walk. . The patient presents for preventative foot care services. No changes to ROS  Podiatric Exam: Vascular: dorsalis pedis and posterior tibial pulses are palpable bilateral. Capillary return is immediate. Temperature gradient is WNL. Skin turgor WNL  Sensorium: Normal Semmes Weinstein monofilament test. Normal tactile sensation bilaterally. Nail Exam: Pt has thick disfigured discolored nails with subungual debris noted bilateral entire nail hallux through fifth toenails Ulcer Exam: There is no evidence of ulcer or pre-ulcerative changes or infection. Orthopedic Exam: Muscle tone and strength are WNL. No limitations in general ROM. No crepitus or effusions noted. HAV with hammer toe  Left foot. Skin:  Porokeratosis 1,5  B/L. No infection or ulcers  Diagnosis: Porokeratosis  B/L  Treatment & Plan Procedures and Treatment: Consent by patient was obtained for treatment procedures. Debridement of porokeratosis  B/L    Gardiner Barefoot DPM

## 2017-04-02 DIAGNOSIS — E539 Vitamin B deficiency, unspecified: Secondary | ICD-10-CM | POA: Diagnosis not present

## 2017-04-17 DIAGNOSIS — E538 Deficiency of other specified B group vitamins: Secondary | ICD-10-CM | POA: Diagnosis not present

## 2017-04-17 DIAGNOSIS — E789 Disorder of lipoprotein metabolism, unspecified: Secondary | ICD-10-CM | POA: Diagnosis not present

## 2017-04-17 DIAGNOSIS — I10 Essential (primary) hypertension: Secondary | ICD-10-CM | POA: Diagnosis not present

## 2017-04-17 DIAGNOSIS — E0789 Other specified disorders of thyroid: Secondary | ICD-10-CM | POA: Diagnosis not present

## 2017-04-17 DIAGNOSIS — E559 Vitamin D deficiency, unspecified: Secondary | ICD-10-CM | POA: Diagnosis not present

## 2017-04-24 DIAGNOSIS — E032 Hypothyroidism due to medicaments and other exogenous substances: Secondary | ICD-10-CM | POA: Diagnosis not present

## 2017-04-24 DIAGNOSIS — E789 Disorder of lipoprotein metabolism, unspecified: Secondary | ICD-10-CM | POA: Diagnosis not present

## 2017-04-24 DIAGNOSIS — I10 Essential (primary) hypertension: Secondary | ICD-10-CM | POA: Diagnosis not present

## 2017-05-03 DIAGNOSIS — E539 Vitamin B deficiency, unspecified: Secondary | ICD-10-CM | POA: Diagnosis not present

## 2017-06-05 DIAGNOSIS — E538 Deficiency of other specified B group vitamins: Secondary | ICD-10-CM | POA: Diagnosis not present

## 2017-06-26 DIAGNOSIS — E538 Deficiency of other specified B group vitamins: Secondary | ICD-10-CM | POA: Diagnosis not present

## 2017-07-03 ENCOUNTER — Encounter (INDEPENDENT_AMBULATORY_CARE_PROVIDER_SITE_OTHER): Payer: PPO | Admitting: Podiatry

## 2017-07-03 NOTE — Progress Notes (Signed)
This encounter was created in error - please disregard.

## 2017-07-25 DIAGNOSIS — E789 Disorder of lipoprotein metabolism, unspecified: Secondary | ICD-10-CM | POA: Diagnosis not present

## 2017-07-25 DIAGNOSIS — I10 Essential (primary) hypertension: Secondary | ICD-10-CM | POA: Diagnosis not present

## 2017-07-25 DIAGNOSIS — Z23 Encounter for immunization: Secondary | ICD-10-CM | POA: Diagnosis not present

## 2017-07-27 DIAGNOSIS — E538 Deficiency of other specified B group vitamins: Secondary | ICD-10-CM | POA: Diagnosis not present

## 2017-07-30 ENCOUNTER — Ambulatory Visit: Payer: PPO | Admitting: Podiatry

## 2017-07-30 ENCOUNTER — Ambulatory Visit (INDEPENDENT_AMBULATORY_CARE_PROVIDER_SITE_OTHER): Payer: PPO | Admitting: Podiatry

## 2017-07-30 ENCOUNTER — Encounter: Payer: Self-pay | Admitting: Podiatry

## 2017-07-30 DIAGNOSIS — Q828 Other specified congenital malformations of skin: Secondary | ICD-10-CM | POA: Diagnosis not present

## 2017-08-01 DIAGNOSIS — I10 Essential (primary) hypertension: Secondary | ICD-10-CM | POA: Diagnosis not present

## 2017-08-01 DIAGNOSIS — E7800 Pure hypercholesterolemia, unspecified: Secondary | ICD-10-CM | POA: Diagnosis not present

## 2017-08-01 DIAGNOSIS — E039 Hypothyroidism, unspecified: Secondary | ICD-10-CM | POA: Diagnosis not present

## 2017-08-02 NOTE — Progress Notes (Signed)
Subjective: Gionni presents the office today for concerns of painful calluses to both of her feet should have trimmed. She states they're painful to pressure in shoes. She presents as an add-on appointment as she apparently never got the message that Dr. Amalia Hailey was out of the office today. She has no other concerns. Denies any systemic complaints such as fevers, chills, nausea, vomiting. No acute changes since last appointment, and no other complaints at this time.   Objective: AAO x3, NAD DP/PT pulses palpable bilaterally, CRT less than 3 seconds Hyperkeratotic lesions present bilateral submetatarsal one and 5. There is no underlying ulceration, drainage or any signs of infection present. There is plantarflexion metatarsal heads with atrophy of No open lesions or pre-ulcerative lesions.  No pain with calf compression, swelling, warmth, erythema  Assessment: Porokeratosis bilaterally  Plan: -All treatment options discussed with the patient including all alternatives, risks, complications.  -Lesions were sharply debrided 4 without any complications or bleeding. There is no ongoing ulceration. -Daily foot inspection  -Follow-up in 3 months at her request with Dr. Amalia Hailey. -Patient encouraged to call the office with any questions, concerns, change in symptoms.   Celesta Gentile, DPM

## 2017-08-23 DIAGNOSIS — H524 Presbyopia: Secondary | ICD-10-CM | POA: Diagnosis not present

## 2017-08-23 DIAGNOSIS — H52203 Unspecified astigmatism, bilateral: Secondary | ICD-10-CM | POA: Diagnosis not present

## 2017-08-23 DIAGNOSIS — H5213 Myopia, bilateral: Secondary | ICD-10-CM | POA: Diagnosis not present

## 2017-08-28 DIAGNOSIS — E538 Deficiency of other specified B group vitamins: Secondary | ICD-10-CM | POA: Diagnosis not present

## 2017-10-09 DIAGNOSIS — E538 Deficiency of other specified B group vitamins: Secondary | ICD-10-CM | POA: Diagnosis not present

## 2017-10-25 DIAGNOSIS — E039 Hypothyroidism, unspecified: Secondary | ICD-10-CM | POA: Diagnosis not present

## 2017-10-25 DIAGNOSIS — I10 Essential (primary) hypertension: Secondary | ICD-10-CM | POA: Diagnosis not present

## 2017-10-30 ENCOUNTER — Encounter: Payer: Self-pay | Admitting: Podiatry

## 2017-10-30 ENCOUNTER — Ambulatory Visit: Payer: PPO | Admitting: Podiatry

## 2017-10-30 DIAGNOSIS — Q828 Other specified congenital malformations of skin: Secondary | ICD-10-CM

## 2017-10-30 NOTE — Progress Notes (Signed)
Complaint:  Visit Type: Patient returns to my office for continued preventative foot care services. Complaint: Patient states" my calluses have become thick and painful to walk. . The patient presents for preventative foot care services. No changes to ROS  Podiatric Exam: Vascular: dorsalis pedis and posterior tibial pulses are palpable bilateral. Capillary return is immediate. Temperature gradient is WNL. Skin turgor WNL  Sensorium: Normal Semmes Weinstein monofilament test. Normal tactile sensation bilaterally. Nail Exam: Pt has thick disfigured discolored nails with subungual debris noted bilateral entire nail hallux through fifth toenails Ulcer Exam: There is no evidence of ulcer or pre-ulcerative changes or infection. Orthopedic Exam: Muscle tone and strength are WNL. No limitations in general ROM. No crepitus or effusions noted. HAV with hammer toe  Left foot. Skin:  Porokeratosis 1,5  B/L. No infection or ulcers  Diagnosis: Porokeratosis  B/L  Treatment & Plan Procedures and Treatment: Consent by patient was obtained for treatment procedures. Debridement of porokeratosis  B/L    Gardiner Barefoot DPM

## 2017-11-01 DIAGNOSIS — E789 Disorder of lipoprotein metabolism, unspecified: Secondary | ICD-10-CM | POA: Diagnosis not present

## 2017-11-01 DIAGNOSIS — E039 Hypothyroidism, unspecified: Secondary | ICD-10-CM | POA: Diagnosis not present

## 2017-11-01 DIAGNOSIS — I10 Essential (primary) hypertension: Secondary | ICD-10-CM | POA: Diagnosis not present

## 2017-11-12 DIAGNOSIS — E538 Deficiency of other specified B group vitamins: Secondary | ICD-10-CM | POA: Diagnosis not present

## 2017-12-04 DIAGNOSIS — E039 Hypothyroidism, unspecified: Secondary | ICD-10-CM | POA: Diagnosis not present

## 2017-12-04 DIAGNOSIS — E7800 Pure hypercholesterolemia, unspecified: Secondary | ICD-10-CM | POA: Diagnosis not present

## 2017-12-04 DIAGNOSIS — I10 Essential (primary) hypertension: Secondary | ICD-10-CM | POA: Diagnosis not present

## 2017-12-13 DIAGNOSIS — E538 Deficiency of other specified B group vitamins: Secondary | ICD-10-CM | POA: Diagnosis not present

## 2017-12-18 DIAGNOSIS — E039 Hypothyroidism, unspecified: Secondary | ICD-10-CM | POA: Diagnosis not present

## 2017-12-18 DIAGNOSIS — I10 Essential (primary) hypertension: Secondary | ICD-10-CM | POA: Diagnosis not present

## 2017-12-18 DIAGNOSIS — E538 Deficiency of other specified B group vitamins: Secondary | ICD-10-CM | POA: Diagnosis not present

## 2018-01-15 DIAGNOSIS — E538 Deficiency of other specified B group vitamins: Secondary | ICD-10-CM | POA: Diagnosis not present

## 2018-01-16 DIAGNOSIS — Z1231 Encounter for screening mammogram for malignant neoplasm of breast: Secondary | ICD-10-CM | POA: Diagnosis not present

## 2018-01-29 ENCOUNTER — Encounter: Payer: Self-pay | Admitting: Podiatry

## 2018-01-29 ENCOUNTER — Ambulatory Visit: Payer: PPO | Admitting: Podiatry

## 2018-01-29 DIAGNOSIS — Q828 Other specified congenital malformations of skin: Secondary | ICD-10-CM | POA: Diagnosis not present

## 2018-01-29 NOTE — Progress Notes (Signed)
Complaint:  Visit Type: Patient returns to my office for continued preventative foot care services. Complaint: Patient states" my calluses have become thick and painful to walk. . The patient presents for preventative foot care services. No changes to ROS  Podiatric Exam: Vascular: dorsalis pedis and posterior tibial pulses are palpable bilateral. Capillary return is immediate. Temperature gradient is WNL. Skin turgor WNL  Sensorium: Normal Semmes Weinstein monofilament test. Normal tactile sensation bilaterally. Nail Exam: Pt has thick disfigured discolored nails with subungual debris noted bilateral entire nail hallux through fifth toenails Ulcer Exam: There is no evidence of ulcer or pre-ulcerative changes or infection. Orthopedic Exam: Muscle tone and strength are WNL. No limitations in general ROM. No crepitus or effusions noted. HAV with hammer toe  Left foot. Skin:  Porokeratosis 1  B/L. Porokeratosis sub 5 right.  No infection or ulcers  Diagnosis: Porokeratosis  B/L  Treatment & Plan Procedures and Treatment: Consent by patient was obtained for treatment procedures. Debridement of porokeratosis  B/L    Gardiner Barefoot DPM

## 2018-02-20 DIAGNOSIS — K449 Diaphragmatic hernia without obstruction or gangrene: Secondary | ICD-10-CM | POA: Diagnosis not present

## 2018-02-20 DIAGNOSIS — J209 Acute bronchitis, unspecified: Secondary | ICD-10-CM | POA: Diagnosis not present

## 2018-02-20 DIAGNOSIS — I10 Essential (primary) hypertension: Secondary | ICD-10-CM | POA: Diagnosis not present

## 2018-02-20 DIAGNOSIS — I517 Cardiomegaly: Secondary | ICD-10-CM | POA: Diagnosis not present

## 2018-02-20 DIAGNOSIS — E0789 Other specified disorders of thyroid: Secondary | ICD-10-CM | POA: Diagnosis not present

## 2018-02-20 DIAGNOSIS — E559 Vitamin D deficiency, unspecified: Secondary | ICD-10-CM | POA: Diagnosis not present

## 2018-02-20 DIAGNOSIS — E789 Disorder of lipoprotein metabolism, unspecified: Secondary | ICD-10-CM | POA: Diagnosis not present

## 2018-02-20 DIAGNOSIS — E538 Deficiency of other specified B group vitamins: Secondary | ICD-10-CM | POA: Diagnosis not present

## 2018-02-20 DIAGNOSIS — E039 Hypothyroidism, unspecified: Secondary | ICD-10-CM | POA: Diagnosis not present

## 2018-02-26 DIAGNOSIS — J209 Acute bronchitis, unspecified: Secondary | ICD-10-CM | POA: Diagnosis not present

## 2018-03-20 DIAGNOSIS — Z79899 Other long term (current) drug therapy: Secondary | ICD-10-CM | POA: Diagnosis not present

## 2018-03-20 DIAGNOSIS — R609 Edema, unspecified: Secondary | ICD-10-CM | POA: Diagnosis not present

## 2018-03-20 DIAGNOSIS — I10 Essential (primary) hypertension: Secondary | ICD-10-CM | POA: Diagnosis not present

## 2018-03-28 DIAGNOSIS — E538 Deficiency of other specified B group vitamins: Secondary | ICD-10-CM | POA: Diagnosis not present

## 2018-03-28 DIAGNOSIS — R0609 Other forms of dyspnea: Secondary | ICD-10-CM | POA: Diagnosis not present

## 2018-04-03 DIAGNOSIS — R609 Edema, unspecified: Secondary | ICD-10-CM | POA: Diagnosis not present

## 2018-04-09 DIAGNOSIS — N179 Acute kidney failure, unspecified: Secondary | ICD-10-CM | POA: Diagnosis not present

## 2018-04-10 ENCOUNTER — Other Ambulatory Visit: Payer: Self-pay | Admitting: Internal Medicine

## 2018-04-10 DIAGNOSIS — N179 Acute kidney failure, unspecified: Secondary | ICD-10-CM

## 2018-04-17 ENCOUNTER — Ambulatory Visit
Admission: RE | Admit: 2018-04-17 | Discharge: 2018-04-17 | Disposition: A | Discharge status: Home / Self Care | Payer: PPO | Source: Ambulatory Visit | Attending: Internal Medicine | Admitting: Internal Medicine

## 2018-04-17 DIAGNOSIS — N179 Acute kidney failure, unspecified: Secondary | ICD-10-CM

## 2018-04-18 DIAGNOSIS — N179 Acute kidney failure, unspecified: Secondary | ICD-10-CM | POA: Diagnosis not present

## 2018-04-19 ENCOUNTER — Other Ambulatory Visit: Payer: Self-pay

## 2018-04-23 ENCOUNTER — Other Ambulatory Visit: Payer: Self-pay

## 2018-04-24 DIAGNOSIS — J029 Acute pharyngitis, unspecified: Secondary | ICD-10-CM | POA: Diagnosis not present

## 2018-04-24 DIAGNOSIS — I10 Essential (primary) hypertension: Secondary | ICD-10-CM | POA: Diagnosis not present

## 2018-04-30 ENCOUNTER — Encounter: Payer: Self-pay | Admitting: Podiatry

## 2018-04-30 ENCOUNTER — Ambulatory Visit: Payer: PPO | Admitting: Podiatry

## 2018-04-30 DIAGNOSIS — M216X9 Other acquired deformities of unspecified foot: Secondary | ICD-10-CM | POA: Diagnosis not present

## 2018-04-30 DIAGNOSIS — Q828 Other specified congenital malformations of skin: Secondary | ICD-10-CM

## 2018-04-30 NOTE — Progress Notes (Signed)
Complaint:  Visit Type: Patient returns to my office for continued preventative foot care services. Complaint: Patient states" my calluses have become thick and painful to walk. . The patient presents for preventative foot care services. No changes to ROS  Podiatric Exam: Vascular: dorsalis pedis and posterior tibial pulses are palpable bilateral. Capillary return is immediate. Temperature gradient is WNL. Skin turgor WNL  Sensorium: Normal Semmes Weinstein monofilament test. Normal tactile sensation bilaterally. Nail Exam: Pt has thick disfigured discolored nails with subungual debris noted bilateral entire nail hallux through fifth toenails Ulcer Exam: There is no evidence of ulcer or pre-ulcerative changes or infection. Orthopedic Exam: Muscle tone and strength are WNL. No limitations in general ROM. No crepitus or effusions noted. HAV with hammer toe  Left foot. Plantar flex 5th  B/L Skin:  Porokeratosis 1  B/L. Porokeratosis sub 5 right.  No infection or ulcers  Diagnosis: Porokeratosis  B/L  Plantar flex 5th  B/L  Treatment & Plan Procedures and Treatment: Consent by patient was obtained for treatment procedures. Debridement of porokeratosis  B/L  RTC 10 weeks   Gardiner Barefoot DPM

## 2018-05-01 DIAGNOSIS — E538 Deficiency of other specified B group vitamins: Secondary | ICD-10-CM | POA: Diagnosis not present

## 2018-05-06 DIAGNOSIS — I10 Essential (primary) hypertension: Secondary | ICD-10-CM | POA: Diagnosis not present

## 2018-05-09 DIAGNOSIS — I10 Essential (primary) hypertension: Secondary | ICD-10-CM | POA: Diagnosis not present

## 2018-05-09 DIAGNOSIS — E785 Hyperlipidemia, unspecified: Secondary | ICD-10-CM | POA: Diagnosis not present

## 2018-05-09 DIAGNOSIS — N179 Acute kidney failure, unspecified: Secondary | ICD-10-CM | POA: Diagnosis not present

## 2018-05-22 DIAGNOSIS — R609 Edema, unspecified: Secondary | ICD-10-CM | POA: Diagnosis not present

## 2018-05-22 DIAGNOSIS — Z0000 Encounter for general adult medical examination without abnormal findings: Secondary | ICD-10-CM | POA: Diagnosis not present

## 2018-05-22 DIAGNOSIS — N179 Acute kidney failure, unspecified: Secondary | ICD-10-CM | POA: Diagnosis not present

## 2018-05-22 DIAGNOSIS — I10 Essential (primary) hypertension: Secondary | ICD-10-CM | POA: Diagnosis not present

## 2018-05-22 DIAGNOSIS — M109 Gout, unspecified: Secondary | ICD-10-CM | POA: Diagnosis not present

## 2018-05-22 DIAGNOSIS — Z780 Asymptomatic menopausal state: Secondary | ICD-10-CM | POA: Diagnosis not present

## 2018-05-22 DIAGNOSIS — E785 Hyperlipidemia, unspecified: Secondary | ICD-10-CM | POA: Diagnosis not present

## 2018-06-04 DIAGNOSIS — E538 Deficiency of other specified B group vitamins: Secondary | ICD-10-CM | POA: Diagnosis not present

## 2018-06-04 DIAGNOSIS — Z23 Encounter for immunization: Secondary | ICD-10-CM | POA: Diagnosis not present

## 2018-07-09 ENCOUNTER — Ambulatory Visit: Payer: PPO | Admitting: Podiatry

## 2018-07-09 DIAGNOSIS — E538 Deficiency of other specified B group vitamins: Secondary | ICD-10-CM | POA: Diagnosis not present

## 2018-07-24 ENCOUNTER — Encounter: Payer: Self-pay | Admitting: Podiatry

## 2018-07-24 ENCOUNTER — Ambulatory Visit: Payer: PPO | Admitting: Podiatry

## 2018-07-24 DIAGNOSIS — Q828 Other specified congenital malformations of skin: Secondary | ICD-10-CM | POA: Diagnosis not present

## 2018-07-24 DIAGNOSIS — M216X9 Other acquired deformities of unspecified foot: Secondary | ICD-10-CM

## 2018-07-24 NOTE — Progress Notes (Signed)
Complaint:  Visit Type: Patient returns to my office for continued preventative foot care services. Complaint: Patient states" my calluses have become thick and painful to walk. . The patient presents for preventative foot care services. No changes to ROS  Podiatric Exam: Vascular: dorsalis pedis and posterior tibial pulses are palpable bilateral. Capillary return is immediate. Temperature gradient is WNL. Skin turgor WNL  Sensorium: Normal Semmes Weinstein monofilament test. Normal tactile sensation bilaterally. Nail Exam: Pt has thick disfigured discolored nails with subungual debris noted bilateral entire nail hallux through fifth toenails Ulcer Exam: There is no evidence of ulcer or pre-ulcerative changes or infection. Orthopedic Exam: Muscle tone and strength are WNL. No limitations in general ROM. No crepitus or effusions noted. HAV with hammer toe  Left foot. Plantar flex 5th  B/L Skin:  Porokeratosis 1  B/L. Porokeratosis sub 5 right.  No infection or ulcers  Diagnosis: Porokeratosis  B/L  Plantar flex 5th  B/L  Treatment & Plan Procedures and Treatment: Consent by patient was obtained for treatment procedures. Debridement of porokeratosis  B/L  RTC 10 weeks   Gardiner Barefoot DPM

## 2018-07-26 DIAGNOSIS — Z23 Encounter for immunization: Secondary | ICD-10-CM | POA: Diagnosis not present

## 2018-07-30 ENCOUNTER — Ambulatory Visit: Payer: PPO | Admitting: Podiatry

## 2018-08-06 DIAGNOSIS — E538 Deficiency of other specified B group vitamins: Secondary | ICD-10-CM | POA: Diagnosis not present

## 2018-08-20 DIAGNOSIS — M810 Age-related osteoporosis without current pathological fracture: Secondary | ICD-10-CM | POA: Diagnosis not present

## 2018-08-20 DIAGNOSIS — E538 Deficiency of other specified B group vitamins: Secondary | ICD-10-CM | POA: Diagnosis not present

## 2018-08-20 DIAGNOSIS — E559 Vitamin D deficiency, unspecified: Secondary | ICD-10-CM | POA: Diagnosis not present

## 2018-08-20 DIAGNOSIS — E039 Hypothyroidism, unspecified: Secondary | ICD-10-CM | POA: Diagnosis not present

## 2018-08-20 DIAGNOSIS — I10 Essential (primary) hypertension: Secondary | ICD-10-CM | POA: Diagnosis not present

## 2018-08-26 DIAGNOSIS — N179 Acute kidney failure, unspecified: Secondary | ICD-10-CM | POA: Diagnosis not present

## 2018-08-26 DIAGNOSIS — E039 Hypothyroidism, unspecified: Secondary | ICD-10-CM | POA: Diagnosis not present

## 2018-08-26 DIAGNOSIS — M8580 Other specified disorders of bone density and structure, unspecified site: Secondary | ICD-10-CM | POA: Diagnosis not present

## 2018-08-26 DIAGNOSIS — E789 Disorder of lipoprotein metabolism, unspecified: Secondary | ICD-10-CM | POA: Diagnosis not present

## 2018-08-26 DIAGNOSIS — I10 Essential (primary) hypertension: Secondary | ICD-10-CM | POA: Diagnosis not present

## 2018-08-26 DIAGNOSIS — N183 Chronic kidney disease, stage 3 (moderate): Secondary | ICD-10-CM | POA: Diagnosis not present

## 2018-08-26 DIAGNOSIS — Z780 Asymptomatic menopausal state: Secondary | ICD-10-CM | POA: Diagnosis not present

## 2018-08-26 DIAGNOSIS — E538 Deficiency of other specified B group vitamins: Secondary | ICD-10-CM | POA: Diagnosis not present

## 2018-08-26 DIAGNOSIS — Z79899 Other long term (current) drug therapy: Secondary | ICD-10-CM | POA: Diagnosis not present

## 2018-08-26 DIAGNOSIS — R609 Edema, unspecified: Secondary | ICD-10-CM | POA: Diagnosis not present

## 2018-08-27 DIAGNOSIS — Z961 Presence of intraocular lens: Secondary | ICD-10-CM | POA: Diagnosis not present

## 2018-08-27 DIAGNOSIS — H52203 Unspecified astigmatism, bilateral: Secondary | ICD-10-CM | POA: Diagnosis not present

## 2018-08-27 DIAGNOSIS — H5213 Myopia, bilateral: Secondary | ICD-10-CM | POA: Diagnosis not present

## 2018-08-27 DIAGNOSIS — H524 Presbyopia: Secondary | ICD-10-CM | POA: Diagnosis not present

## 2018-09-10 DIAGNOSIS — E538 Deficiency of other specified B group vitamins: Secondary | ICD-10-CM | POA: Diagnosis not present

## 2018-10-10 DIAGNOSIS — M25511 Pain in right shoulder: Secondary | ICD-10-CM | POA: Diagnosis not present

## 2018-10-10 DIAGNOSIS — E538 Deficiency of other specified B group vitamins: Secondary | ICD-10-CM | POA: Diagnosis not present

## 2018-10-30 ENCOUNTER — Ambulatory Visit: Payer: PPO | Admitting: Podiatry

## 2018-11-12 DIAGNOSIS — E538 Deficiency of other specified B group vitamins: Secondary | ICD-10-CM | POA: Diagnosis not present

## 2018-12-10 DIAGNOSIS — E538 Deficiency of other specified B group vitamins: Secondary | ICD-10-CM | POA: Diagnosis not present

## 2018-12-18 DIAGNOSIS — E538 Deficiency of other specified B group vitamins: Secondary | ICD-10-CM | POA: Diagnosis not present

## 2018-12-18 DIAGNOSIS — I10 Essential (primary) hypertension: Secondary | ICD-10-CM | POA: Diagnosis not present

## 2018-12-18 DIAGNOSIS — E559 Vitamin D deficiency, unspecified: Secondary | ICD-10-CM | POA: Diagnosis not present

## 2018-12-18 DIAGNOSIS — E039 Hypothyroidism, unspecified: Secondary | ICD-10-CM | POA: Diagnosis not present

## 2018-12-18 DIAGNOSIS — E756 Lipid storage disorder, unspecified: Secondary | ICD-10-CM | POA: Diagnosis not present

## 2018-12-18 DIAGNOSIS — E789 Disorder of lipoprotein metabolism, unspecified: Secondary | ICD-10-CM | POA: Diagnosis not present

## 2018-12-25 DIAGNOSIS — E039 Hypothyroidism, unspecified: Secondary | ICD-10-CM | POA: Diagnosis not present

## 2018-12-25 DIAGNOSIS — E538 Deficiency of other specified B group vitamins: Secondary | ICD-10-CM | POA: Diagnosis not present

## 2018-12-25 DIAGNOSIS — I10 Essential (primary) hypertension: Secondary | ICD-10-CM | POA: Diagnosis not present

## 2018-12-25 DIAGNOSIS — E559 Vitamin D deficiency, unspecified: Secondary | ICD-10-CM | POA: Diagnosis not present

## 2018-12-25 DIAGNOSIS — E789 Disorder of lipoprotein metabolism, unspecified: Secondary | ICD-10-CM | POA: Diagnosis not present

## 2018-12-25 DIAGNOSIS — N183 Chronic kidney disease, stage 3 (moderate): Secondary | ICD-10-CM | POA: Diagnosis not present

## 2019-03-26 DIAGNOSIS — I10 Essential (primary) hypertension: Secondary | ICD-10-CM | POA: Diagnosis not present

## 2019-03-26 DIAGNOSIS — E039 Hypothyroidism, unspecified: Secondary | ICD-10-CM | POA: Diagnosis not present

## 2019-03-26 DIAGNOSIS — E538 Deficiency of other specified B group vitamins: Secondary | ICD-10-CM | POA: Diagnosis not present

## 2019-04-02 DIAGNOSIS — Z7189 Other specified counseling: Secondary | ICD-10-CM | POA: Diagnosis not present

## 2019-04-02 DIAGNOSIS — E039 Hypothyroidism, unspecified: Secondary | ICD-10-CM | POA: Diagnosis not present

## 2019-04-02 DIAGNOSIS — R609 Edema, unspecified: Secondary | ICD-10-CM | POA: Diagnosis not present

## 2019-04-02 DIAGNOSIS — E756 Lipid storage disorder, unspecified: Secondary | ICD-10-CM | POA: Diagnosis not present

## 2019-04-02 DIAGNOSIS — I10 Essential (primary) hypertension: Secondary | ICD-10-CM | POA: Diagnosis not present

## 2019-04-02 DIAGNOSIS — N183 Chronic kidney disease, stage 3 (moderate): Secondary | ICD-10-CM | POA: Diagnosis not present

## 2019-04-02 DIAGNOSIS — R441 Visual hallucinations: Secondary | ICD-10-CM | POA: Diagnosis not present

## 2019-04-02 DIAGNOSIS — E559 Vitamin D deficiency, unspecified: Secondary | ICD-10-CM | POA: Diagnosis not present

## 2019-04-02 DIAGNOSIS — E538 Deficiency of other specified B group vitamins: Secondary | ICD-10-CM | POA: Diagnosis not present

## 2019-04-18 DIAGNOSIS — E039 Hypothyroidism, unspecified: Secondary | ICD-10-CM | POA: Diagnosis not present

## 2019-04-18 DIAGNOSIS — I10 Essential (primary) hypertension: Secondary | ICD-10-CM | POA: Diagnosis not present

## 2019-04-18 DIAGNOSIS — E538 Deficiency of other specified B group vitamins: Secondary | ICD-10-CM | POA: Diagnosis not present

## 2019-04-18 DIAGNOSIS — Z0000 Encounter for general adult medical examination without abnormal findings: Secondary | ICD-10-CM | POA: Diagnosis not present

## 2019-05-05 DIAGNOSIS — E538 Deficiency of other specified B group vitamins: Secondary | ICD-10-CM | POA: Diagnosis not present

## 2019-05-06 DIAGNOSIS — M549 Dorsalgia, unspecified: Secondary | ICD-10-CM | POA: Diagnosis not present

## 2019-05-06 DIAGNOSIS — R05 Cough: Secondary | ICD-10-CM | POA: Diagnosis not present

## 2019-05-06 DIAGNOSIS — R509 Fever, unspecified: Secondary | ICD-10-CM | POA: Diagnosis not present

## 2019-05-08 ENCOUNTER — Emergency Department (HOSPITAL_COMMUNITY): Payer: PPO

## 2019-05-08 ENCOUNTER — Encounter (HOSPITAL_COMMUNITY): Payer: Self-pay | Admitting: Emergency Medicine

## 2019-05-08 ENCOUNTER — Emergency Department (HOSPITAL_COMMUNITY)
Admission: EM | Admit: 2019-05-08 | Discharge: 2019-05-08 | Disposition: A | Discharge status: Home / Self Care | Payer: PPO | Attending: Emergency Medicine | Admitting: Emergency Medicine

## 2019-05-08 ENCOUNTER — Other Ambulatory Visit: Payer: Self-pay

## 2019-05-08 DIAGNOSIS — R05 Cough: Secondary | ICD-10-CM | POA: Insufficient documentation

## 2019-05-08 DIAGNOSIS — Z20828 Contact with and (suspected) exposure to other viral communicable diseases: Secondary | ICD-10-CM | POA: Diagnosis not present

## 2019-05-08 DIAGNOSIS — I959 Hypotension, unspecified: Secondary | ICD-10-CM | POA: Diagnosis not present

## 2019-05-08 DIAGNOSIS — M542 Cervicalgia: Secondary | ICD-10-CM | POA: Diagnosis not present

## 2019-05-08 DIAGNOSIS — Z87891 Personal history of nicotine dependence: Secondary | ICD-10-CM | POA: Insufficient documentation

## 2019-05-08 DIAGNOSIS — M25519 Pain in unspecified shoulder: Secondary | ICD-10-CM | POA: Insufficient documentation

## 2019-05-08 DIAGNOSIS — E039 Hypothyroidism, unspecified: Secondary | ICD-10-CM | POA: Diagnosis not present

## 2019-05-08 DIAGNOSIS — M436 Torticollis: Secondary | ICD-10-CM | POA: Insufficient documentation

## 2019-05-08 DIAGNOSIS — R0902 Hypoxemia: Secondary | ICD-10-CM | POA: Diagnosis not present

## 2019-05-08 DIAGNOSIS — R059 Cough, unspecified: Secondary | ICD-10-CM

## 2019-05-08 DIAGNOSIS — Z209 Contact with and (suspected) exposure to unspecified communicable disease: Secondary | ICD-10-CM | POA: Diagnosis not present

## 2019-05-08 DIAGNOSIS — I10 Essential (primary) hypertension: Secondary | ICD-10-CM | POA: Insufficient documentation

## 2019-05-08 LAB — CBC WITH DIFFERENTIAL/PLATELET
Abs Immature Granulocytes: 0.04 10*3/uL (ref 0.00–0.07)
Basophils Absolute: 0 10*3/uL (ref 0.0–0.1)
Basophils Relative: 0 %
Eosinophils Absolute: 0 10*3/uL (ref 0.0–0.5)
Eosinophils Relative: 1 %
HCT: 28.4 % — ABNORMAL LOW (ref 36.0–46.0)
Hemoglobin: 9.2 g/dL — ABNORMAL LOW (ref 12.0–15.0)
Immature Granulocytes: 1 %
Lymphocytes Relative: 5 %
Lymphs Abs: 0.4 10*3/uL — ABNORMAL LOW (ref 0.7–4.0)
MCH: 31.8 pg (ref 26.0–34.0)
MCHC: 32.4 g/dL (ref 30.0–36.0)
MCV: 98.3 fL (ref 80.0–100.0)
Monocytes Absolute: 0.6 10*3/uL (ref 0.1–1.0)
Monocytes Relative: 7 %
Neutro Abs: 7.3 10*3/uL (ref 1.7–7.7)
Neutrophils Relative %: 86 %
Platelets: 221 10*3/uL (ref 150–400)
RBC: 2.89 MIL/uL — ABNORMAL LOW (ref 3.87–5.11)
RDW: 13.8 % (ref 11.5–15.5)
WBC: 8.4 10*3/uL (ref 4.0–10.5)
nRBC: 0 % (ref 0.0–0.2)

## 2019-05-08 LAB — BASIC METABOLIC PANEL
Anion gap: 13 (ref 5–15)
BUN: 26 mg/dL — ABNORMAL HIGH (ref 8–23)
CO2: 22 mmol/L (ref 22–32)
Calcium: 7.9 mg/dL — ABNORMAL LOW (ref 8.9–10.3)
Chloride: 103 mmol/L (ref 98–111)
Creatinine, Ser: 1.39 mg/dL — ABNORMAL HIGH (ref 0.44–1.00)
GFR calc Af Amer: 40 mL/min — ABNORMAL LOW (ref >60)
GFR calc non Af Amer: 35 mL/min — ABNORMAL LOW (ref >60)
Glucose, Bld: 91 mg/dL (ref 70–99)
Potassium: 3.4 mmol/L — ABNORMAL LOW (ref 3.5–5.1)
Sodium: 138 mmol/L (ref 135–145)

## 2019-05-08 MED ORDER — DICLOFENAC SODIUM 1 % TD GEL: 2.0000 g | Freq: Four times a day (QID) | TRANSDERMAL | 0 refills | Status: DC | PRN

## 2019-05-08 NOTE — ED Notes (Signed)
Pt d/c home per MD order. Discharge summary reviewed with pt. Pt verbalizes understanding. Pt requesting a cab to be called to take her home, No s/s of acute distress noted.

## 2019-05-08 NOTE — ED Notes (Addendum)
HEAT PACK PLACED ON LOWER CERVICAL SPINE UPPER THORACIC SPINE ALONG WITH BOTH SHOULDERS

## 2019-05-08 NOTE — Discharge Instructions (Signed)
You were seen in the emergency department today with pain in the neck along with cough.  Your CT scan, x-ray, lab work looked normal.  I am sending off a coronavirus test.  The results should come back in the next 2 to 3 days.  You can check the results in [place]. You have information here on how to set that application up for results. Call your PCP today to schedule a follow-up appointment in the coming week.  Return to the emergency department with any new or worsening symptoms.

## 2019-05-08 NOTE — ED Provider Notes (Signed)
Emergency Department Provider Note   I have reviewed the triage vital signs and the nursing notes.   HISTORY  Chief Complaint Neck Pain and Back Pain   HPI Jennifer Patel is a 85 y.o. female with PMH of HLD, HTN, and thyroid disease presents to the emergency department for evaluation of neck stiffness into both shoulders but worse on the right.  Patient denies any injury or falls.  Symptoms of been ongoing for the past several days.  2 days ago she was seen at urgent care at which time they discussed transfer to the emergency department.  In that documentation, provided at bedside, they mention new cough and temp they are of 99.5 F.  Patient states they were concerned about possible coronavirus but that she decided to return home and watch/wait.  Her symptoms have not improved.  She denies any numbness or weakness in the upper or lower extremities.  She has not been unintentionally dropping things from her hands.  She denies headache.  She has not experienced fever or chills.  Denies vision changes.  No shortness of breath or chest discomfort.  Denies abdominal pain.  She has been taking over-the-counter Tylenol and Motrin for pain.    Past Medical History:  Diagnosis Date  . High cholesterol   . Hypertension   . Thyroid disease     Patient Active Problem List   Diagnosis Date Noted  . Resistant hypertension 11/13/2015  . AKI (acute kidney injury) ([place]) 05/22/2015  . Abnormal EKG 05/22/2015  . Hypertension 05/22/2015  . Hypothyroidism 05/22/2015  . Hyperlipidemia 05/22/2015  . Neck pain 05/22/2015    Past Surgical History:  Procedure Laterality Date  . ABDOMINAL HYSTERECTOMY    . CHOLECYSTECTOMY    . PERIPHERAL VASCULAR CATHETERIZATION N/A 11/16/2015   Procedure: Renal Angiography;  Surgeon: Adrian Prows, MD;  Location: [facility];  Service: Cardiovascular;  Laterality: N/A;    Allergies Clarithromycin and Prednisone  History reviewed. No pertinent family history.   Social History Social History   Tobacco Use  . Smoking status: Former Research scientist (life sciences)  . Smokeless tobacco: Never Used  Substance Use Topics  . Alcohol use: No    Alcohol/week: 0.0 standard drinks  . Drug use: No    Review of Systems  Constitutional: No fever/chills Eyes: No visual changes. ENT: No sore throat. Cardiovascular: Denies chest pain. Respiratory: Denies shortness of breath. Positive cough.  Gastrointestinal: No abdominal pain.  No nausea, no vomiting.  No diarrhea.  No constipation. Genitourinary: Negative for dysuria. Musculoskeletal: Positive neck pain.  Skin: Negative for rash. Neurological: Negative for headaches, focal weakness or numbness.  10-point ROS otherwise negative.  ____________________________________________   PHYSICAL EXAM:  VITAL SIGNS: ED Triage Vitals  Enc Vitals Group     BP 05/08/19 1040 (!) 163/59     Pulse Rate 05/08/19 1040 78     Resp 05/08/19 1040 17     Temp 05/08/19 1040 98.9 F (37.2 C)     Temp Source 05/08/19 1040 Oral     SpO2 05/08/19 1040 97 %     Weight 05/08/19 1040 105 lb (47.6 kg)     Height 05/08/19 1040 3\' 11"  (1.194 m)   Constitutional: Alert and oriented. Well appearing and in no acute distress. Eyes: Conjunctivae are normal.  Head: Atraumatic. Nose: No congestion/rhinnorhea. Mouth/Throat: Mucous membranes are moist.  Oropharynx non-erythematous. Neck: No stridor. Diffuse tenderness over the midline and paracervical musculature. No cellulitis. Tenderness extends over the trapezius on the  right.  Cardiovascular: Normal rate, regular rhythm. Good peripheral circulation. Grossly normal heart sounds.   Respiratory: Normal respiratory effort.  No retractions. Lungs CTAB. Gastrointestinal: Soft and nontender. No distention.  Musculoskeletal: No gross deformities of extremities. Neurologic:  Normal speech and language. Normal strength and sensation in the bilateral upper and lower extremities.  Skin:  Skin is warm, dry  and intact. No rash noted.  ____________________________________________   LABS (all labs ordered are listed, but only abnormal results are displayed)  Labs Reviewed  BASIC METABOLIC PANEL - Abnormal; Notable for the following components:      Result Value   Potassium 3.4 (*)    BUN 26 (*)    Creatinine, Ser 1.39 (*)    Calcium 7.9 (*)    GFR calc non Af Amer 35 (*)    GFR calc Af Amer 40 (*)    All other components within normal limits  CBC WITH DIFFERENTIAL/PLATELET - Abnormal; Notable for the following components:   RBC 2.89 (*)    Hemoglobin 9.2 (*)    HCT 28.4 (*)    Lymphs Abs 0.4 (*)    All other components within normal limits  NOVEL CORONAVIRUS, NAA (HOSPITAL ORDER, SEND-OUT TO REF LAB)   ____________________________________________  RADIOLOGY  Ct Cervical Spine Wo Contrast  Result Date: 05/08/2019 CLINICAL DATA:  Neck and back pain for 4 days. EXAM: CT CERVICAL SPINE WITHOUT CONTRAST TECHNIQUE: Multidetector CT imaging of the cervical spine was performed without intravenous contrast. Multiplanar CT image reconstructions were also generated. COMPARISON:  None. FINDINGS: Alignment: Slight levoconvex scoliosis of the cervical spine. 1-2 mm anterolisthesis of C6 on C7 and C5 on C6. Skull base and vertebrae: No acute fracture. No primary bone lesion. Soft tissues and spinal canal: No prevertebral fluid or swelling. No visible canal hematoma. Disc levels: Multilevel osteoarthritic changes with mild narrowing of the disc spaces and moderate in severity posterior facet arthropathy. Minimal osseous neural foraminal narrowing of right C3-C4, left C4-C5, right C5-C6 right C6-C7. Upper chest: Negative. Other: None. IMPRESSION: 1. No evidence of acute traumatic injury to the cervical spine. 2. Multilevel osteoarthritic changes of the cervical spine with minimal anterolisthesis of C6 on C7 and C5 on C6. 3. Mild multilevel osseous neural foraminal narrowing. Electronically Signed   By:  Fidela Salisbury M.D.   On: 05/08/2019 12:00   Dg Chest Portable 1 View  Result Date: 05/08/2019 CLINICAL DATA:  Neck and back pain.  Cough. EXAM: PORTABLE CHEST 1 VIEW COMPARISON:  None. FINDINGS: The heart size and mediastinal contours are within normal limits. Both lungs are clear. The visualized skeletal structures are unremarkable. IMPRESSION: No active disease. Electronically Signed   By: Dorise Bullion III M.D   On: 05/08/2019 12:00    ____________________________________________   PROCEDURES  Procedure(s) performed:   Procedures  None  ____________________________________________   INITIAL IMPRESSION / ASSESSMENT AND PLAN / ED COURSE  Pertinent labs & imaging results that were available during my care of the patient were reviewed by me and considered in my medical decision making (see chart for details).   Patient presents to the emergency department for evaluation of neck stiffness and shoulder discomfort.  She reports cough without shortness of breath.  Temp 2 days ago at urgent care noted to be 99.5 F and they advised that she come to the ED for COVID evaluation.  My suspicion for coronavirus is very low.  Her symptoms seem to be mostly musculoskeletal tightness and stiffness.  She is not having  headache, fever, other meningitis type symptoms.  Plan for CT imaging of the cervical spine along with chest x-ray and screening blood work.   CT imaging and CXR reviewed. Labs unremarkable. Suspect MSK etiology. COVID test will be sent out.  ____________________________________________  FINAL CLINICAL IMPRESSION(S) / ED DIAGNOSES  Final diagnoses:  Torticollis  Cough    NEW OUTPATIENT MEDICATIONS STARTED DURING THIS VISIT:  New Prescriptions   DICLOFENAC SODIUM (VOLTAREN) 1 % GEL    Apply 2 g topically 4 (four) times daily as needed.    Note:  This document was prepared using Dragon voice recognition software and may include unintentional dictation errors.  Nanda Quinton, MD Emergency Medicine    Long, Wonda [facility], MD 05/08/19 [phone]

## 2019-05-08 NOTE — ED Notes (Signed)
Patient transported to CT 

## 2019-05-08 NOTE — ED Triage Notes (Signed)
Patient arrived by EMS from home. Pt c/o neck and back pain x 4 days. Pt had no falls or heavy lifting.   Pt was seen at Urgent Care x 2 days ago and was given advise to take Tylenol and come to hospital if pain remained.

## 2019-05-10 LAB — NOVEL CORONAVIRUS, NAA (HOSP ORDER, SEND-OUT TO REF LAB; TAT 18-24 HRS): SARS-CoV-2, NAA: NOT DETECTED

## 2019-05-12 NOTE — Progress Notes (Signed)
Results  Reviewed with pt. No further questions

## 2019-05-13 DIAGNOSIS — N183 Chronic kidney disease, stage 3 (moderate): Secondary | ICD-10-CM | POA: Diagnosis not present

## 2019-05-13 DIAGNOSIS — M542 Cervicalgia: Secondary | ICD-10-CM | POA: Diagnosis not present

## 2019-05-13 DIAGNOSIS — E039 Hypothyroidism, unspecified: Secondary | ICD-10-CM | POA: Diagnosis not present

## 2019-05-16 ENCOUNTER — Telehealth: Payer: Self-pay | Admitting: Pharmacist

## 2019-05-16 NOTE — Patient Outreach (Signed)
[facility]) Care Management  [place]   05/16/2019  Jennifer Patel 1931-12-19 [id number]  Reason for referral: medication assistance  Referral source: [facility] Referral medication(s): unknown Current insurance: [employer]  HPI:  Hypertension, hypothyroidsism, hyperlipidemia. Patient stated Bystolic and one other medication she is taking had elevated copays that were hard for her to afford.  Patient visited the ED 05/08/2019.   Objective: Allergies  Allergen Reactions  . Clarithromycin Anaphylaxis and Swelling    Throat swelling  . Prednisone Anaphylaxis and Swelling    Throat swelling and tightness from throat to stomach (05/22/15)    Medications Reviewed Today    Reviewed by Gardiner Barefoot, DPM (Physician) on 07/24/18 at 1455  Med List Status: <None>  Medication Order Taking? Sig Documenting Provider Last Dose Status Informant  acetaminophen (TYLENOL) 500 MG tablet [id number] No Take 500 mg by mouth every 6 (six) hours as needed (pain). [provider] Not Taking Active Self  allopurinol (ZYLOPRIM) 100 MG tablet [id number] No Take 200 mg by mouth daily.  [provider] Not Taking Active Self           Med Note Jenel Lucks Nov 11, 2015  4:57 PM)    aspirin EC 81 MG tablet [id number]  Take 81 mg by mouth daily. [provider]  Active Self  atorvastatin (LIPITOR) 10 MG tablet [id number]  Take 10 mg by mouth daily. Reported on 11/11/2015 [provider]  Active Self  benzonatate (TESSALON) 100 MG capsule [id number] No Take 1 capsule (100 mg total) by mouth 3 (three) times daily as needed for cough.  Patient not taking: Reported on 07/24/2018   Gloriann Loan, PA-C Not Taking Active Self  Cyanocobalamin (VITAMIN B-12 IJ) [id number] No Inject as directed every 30 (thirty) days. Next injection due 05/24/15 at Dr. Eugenio Hoes office [provider] Not Taking Active Self           Med Note Meda Coffee, Boyce Medici Nov 11, 2015  5:08 PM)    doxycycline (VIBRAMYCIN) 100 MG capsule [id number] No Take 1 capsule (100 mg total) by mouth 2 (two) times daily.  Patient not taking: Reported on 11/11/2015   Muthersbaugh, Jarrett Soho, PA-C Not Taking Active Self  guaiFENesin-codeine [facility]) 100-10 MG/5ML syrup [id number]  Take 5 mLs by mouth every 6 (six) hours. Reported on 11/11/2015 [provider]  Active Self  hydrALAZINE (APRESOLINE) 25 MG tablet [id number]  Take 1 tablet (25 mg total) by mouth 3 (three) times daily. Adrian Prows, MD  Active   lansoprazole (PREVACID) 15 MG capsule [id number]  Take 15 mg by mouth daily at 12 noon. [provider]  Active Self  levothyroxine (SYNTHROID, LEVOTHROID) 50 MCG tablet [id number]  Take 50 mcg by mouth daily. [provider]  Active Self           Med Note Maryjean Morn, RICARDO K   Wed Jul 24, 2018  2:32 PM) 75MCG  nebivolol (BYSTOLIC) 10 MG tablet [id number]  Take 20 mg by mouth daily. [provider]  Active Self  Polyethyl Glycol-Propyl Glycol (SYSTANE OP) [id number]  Place 1 drop into both eyes 2 (two) times daily as needed (dry eyes). [provider]  Active Self  spironolactone (ALDACTONE) 25 MG tablet [id number]  Take 12.5 mg by mouth daily.  [provider]  Active Self  valsartan (DIOVAN) 320 MG tablet [id number] No Take 320 mg by mouth daily. Adrian Prows, MD Not Taking Active Pharmacy  Records  Med List Note Tessie Fass, Tamera Punt, CPhT 08/17/15 1129): Patient is not sure which anti-biotic she is taking. Her most recent antibiotic is doxycycline and she thinks she took it this morning.          Assessment:  Drugs sorted by system:  Cardiovascular: Aspirin, Atorvastatin, Hydralazine, Valsartan, Bystolic, Spironolactone  Gastrointestinal: Lansoprazole  Endocrine: Synthroid  Renal: Allopurinol,   Topical: Diclofenac Gel  Pain: Acetaminophen  Vitamins/Minerals/Supplements: Cyanocobalamin Injection  Miscellaneous: Systane  Eye drops  Medication Review Findings:   . K- 3.4 (low--may need to be repeated.  Valsartan and Spironolactone are on her medication list and when used in combination can increase K.  . However, patient has not had Valsartan filled this year although it is on her medication list.                                   Medication Assistance Findings:  Medication assistance needs identified: Bystolic and Synthroid  Additional medication assistance options reviewed with patient as warranted:  No other options identified  Plan: I will route patient assistance letter to [place] technician who will coordinate patient assistance program application process for medications listed above.  [facility] pharmacy technician will assist with obtaining all required documents from both patient and provider(s) and submit application(s) once completed.    Route note to patient's PCP about valsartan.  (Called Dr. Julianne Rice office but their office was closed.)  Elayne Guerin, PharmD, [place] Clinical Pharmacist [phone]

## 2019-05-20 ENCOUNTER — Other Ambulatory Visit: Payer: Self-pay | Admitting: Pharmacy Technician

## 2019-05-20 NOTE — Patient Outreach (Signed)
[facility]) Care Management  05/20/2019  Jennifer Patel 08-Jun-1932 [id number]                           Medication Assistance Referral  Referral From: [place]  Medication/Company: Bystolic / Allergan Patient application portion:  Mailed Provider application portion: Faxed  to Dr Jani Gravel  Medication/Company: Synthroid / [name] Patient application portion:  Mailed Provider application portion: Faxed  to Dr Jani Gravel    Follow up:  Will follow up with patient in 5-10 business days to confirm application(s) have been received.  [name], [place] [facility] [phone]

## 2019-05-21 ENCOUNTER — Other Ambulatory Visit: Payer: Self-pay | Admitting: Pharmacist

## 2019-05-21 NOTE — Patient Outreach (Signed)
[facility]) Care Management  05/21/2019  Jennifer Patel 08/30/1932 [id number]   Called patient to follow up with her. HIPAA identifiers were obtained.  Just after getting the patient's HIPAA information, she informed me that she no longer wanted to participate in medication assistance.  She said she spoke with "Ronnie" from [employer] who concurred that she should not send her information to people she does not know.  I spoke with the patient last week and her patient assistance applications were sent out already.    The patient was referred through [employer] [facility] department from Rosalie Doctor, RN.   A message was left on [name]'s voicemail.   Plan: Follow up in 3-4 business days.   Elayne Guerin, PharmD, [place] Clinical Pharmacist [phone]

## 2019-05-23 ENCOUNTER — Ambulatory Visit: Payer: Self-pay | Admitting: Pharmacist

## 2019-05-23 ENCOUNTER — Other Ambulatory Visit: Payer: Self-pay | Admitting: Pharmacist

## 2019-05-23 NOTE — Patient Outreach (Signed)
[facility]) Care Management  05/23/2019  Jennifer Patel Mar 10, 1932 [id number]   I received a call back from [place], RN. She left a message on my voicemail that she attempted to explain to the patient that I was a Pharmacist trying to help her.  Unfortunately, the patient said she was going to tear up the applications when she gets them and will not complete them.  Plan: Close patient's pharmacy case. Send a barrier's letter to her provider. Send closure letter to patient.   Elayne Guerin, PharmD, [place] Clinical Pharmacist [phone]

## 2019-06-03 DIAGNOSIS — E538 Deficiency of other specified B group vitamins: Secondary | ICD-10-CM | POA: Diagnosis not present

## 2019-07-07 DIAGNOSIS — E538 Deficiency of other specified B group vitamins: Secondary | ICD-10-CM | POA: Diagnosis not present

## 2019-07-07 DIAGNOSIS — Z23 Encounter for immunization: Secondary | ICD-10-CM | POA: Diagnosis not present

## 2019-07-23 DIAGNOSIS — Z1231 Encounter for screening mammogram for malignant neoplasm of breast: Secondary | ICD-10-CM | POA: Diagnosis not present

## 2019-08-06 DIAGNOSIS — E538 Deficiency of other specified B group vitamins: Secondary | ICD-10-CM | POA: Diagnosis not present

## 2019-08-11 DIAGNOSIS — E039 Hypothyroidism, unspecified: Secondary | ICD-10-CM | POA: Diagnosis not present

## 2019-08-11 DIAGNOSIS — I10 Essential (primary) hypertension: Secondary | ICD-10-CM | POA: Diagnosis not present

## 2019-08-18 DIAGNOSIS — Z23 Encounter for immunization: Secondary | ICD-10-CM | POA: Diagnosis not present

## 2019-08-18 DIAGNOSIS — E039 Hypothyroidism, unspecified: Secondary | ICD-10-CM | POA: Diagnosis not present

## 2019-08-18 DIAGNOSIS — I10 Essential (primary) hypertension: Secondary | ICD-10-CM | POA: Diagnosis not present

## 2019-08-18 DIAGNOSIS — E756 Lipid storage disorder, unspecified: Secondary | ICD-10-CM | POA: Diagnosis not present

## 2019-09-09 DIAGNOSIS — E538 Deficiency of other specified B group vitamins: Secondary | ICD-10-CM | POA: Diagnosis not present

## 2019-10-07 DIAGNOSIS — E538 Deficiency of other specified B group vitamins: Secondary | ICD-10-CM | POA: Diagnosis not present

## 2019-10-27 DIAGNOSIS — H524 Presbyopia: Secondary | ICD-10-CM | POA: Diagnosis not present

## 2019-10-27 DIAGNOSIS — H5213 Myopia, bilateral: Secondary | ICD-10-CM | POA: Diagnosis not present

## 2019-10-27 DIAGNOSIS — Z961 Presence of intraocular lens: Secondary | ICD-10-CM | POA: Diagnosis not present

## 2019-10-27 DIAGNOSIS — H52203 Unspecified astigmatism, bilateral: Secondary | ICD-10-CM | POA: Diagnosis not present

## 2019-11-11 DIAGNOSIS — E538 Deficiency of other specified B group vitamins: Secondary | ICD-10-CM | POA: Diagnosis not present

## 2019-11-11 DIAGNOSIS — I10 Essential (primary) hypertension: Secondary | ICD-10-CM | POA: Diagnosis not present

## 2019-11-18 DIAGNOSIS — E538 Deficiency of other specified B group vitamins: Secondary | ICD-10-CM | POA: Diagnosis not present

## 2019-11-18 DIAGNOSIS — I10 Essential (primary) hypertension: Secondary | ICD-10-CM | POA: Diagnosis not present

## 2019-11-18 DIAGNOSIS — N183 Chronic kidney disease, stage 3 unspecified: Secondary | ICD-10-CM | POA: Diagnosis not present

## 2019-11-18 DIAGNOSIS — E559 Vitamin D deficiency, unspecified: Secondary | ICD-10-CM | POA: Diagnosis not present

## 2019-11-18 DIAGNOSIS — E039 Hypothyroidism, unspecified: Secondary | ICD-10-CM | POA: Diagnosis not present

## 2019-12-16 DIAGNOSIS — E538 Deficiency of other specified B group vitamins: Secondary | ICD-10-CM | POA: Diagnosis not present

## 2020-01-13 DIAGNOSIS — E538 Deficiency of other specified B group vitamins: Secondary | ICD-10-CM | POA: Diagnosis not present

## 2020-02-04 DIAGNOSIS — R443 Hallucinations, unspecified: Secondary | ICD-10-CM | POA: Diagnosis not present

## 2020-02-10 DIAGNOSIS — E559 Vitamin D deficiency, unspecified: Secondary | ICD-10-CM | POA: Diagnosis not present

## 2020-02-10 DIAGNOSIS — R443 Hallucinations, unspecified: Secondary | ICD-10-CM | POA: Diagnosis not present

## 2020-02-10 DIAGNOSIS — E538 Deficiency of other specified B group vitamins: Secondary | ICD-10-CM | POA: Diagnosis not present

## 2020-02-10 DIAGNOSIS — E789 Disorder of lipoprotein metabolism, unspecified: Secondary | ICD-10-CM | POA: Diagnosis not present

## 2020-02-10 DIAGNOSIS — E039 Hypothyroidism, unspecified: Secondary | ICD-10-CM | POA: Diagnosis not present

## 2020-02-10 DIAGNOSIS — I10 Essential (primary) hypertension: Secondary | ICD-10-CM | POA: Diagnosis not present

## 2020-03-11 ENCOUNTER — Inpatient Hospital Stay (HOSPITAL_COMMUNITY)
Admission: EM | Admit: 2020-03-11 | Discharge: 2020-03-15 | DRG: 481 | Disposition: A | Discharge status: Skilled Nursing Facility | Payer: PPO | Attending: Internal Medicine | Admitting: Internal Medicine

## 2020-03-11 ENCOUNTER — Encounter (HOSPITAL_COMMUNITY): Payer: Self-pay

## 2020-03-11 ENCOUNTER — Emergency Department (HOSPITAL_COMMUNITY): Payer: PPO

## 2020-03-11 DIAGNOSIS — Z87891 Personal history of nicotine dependence: Secondary | ICD-10-CM | POA: Diagnosis not present

## 2020-03-11 DIAGNOSIS — W19XXXA Unspecified fall, initial encounter: Secondary | ICD-10-CM

## 2020-03-11 DIAGNOSIS — Z03818 Encounter for observation for suspected exposure to other biological agents ruled out: Secondary | ICD-10-CM | POA: Diagnosis not present

## 2020-03-11 DIAGNOSIS — M25561 Pain in right knee: Secondary | ICD-10-CM | POA: Diagnosis not present

## 2020-03-11 DIAGNOSIS — R0902 Hypoxemia: Secondary | ICD-10-CM | POA: Diagnosis not present

## 2020-03-11 DIAGNOSIS — E876 Hypokalemia: Secondary | ICD-10-CM | POA: Diagnosis present

## 2020-03-11 DIAGNOSIS — R52 Pain, unspecified: Secondary | ICD-10-CM | POA: Diagnosis not present

## 2020-03-11 DIAGNOSIS — Z7982 Long term (current) use of aspirin: Secondary | ICD-10-CM

## 2020-03-11 DIAGNOSIS — G8918 Other acute postprocedural pain: Secondary | ICD-10-CM | POA: Diagnosis not present

## 2020-03-11 DIAGNOSIS — Z20822 Contact with and (suspected) exposure to covid-19: Secondary | ICD-10-CM | POA: Diagnosis present

## 2020-03-11 DIAGNOSIS — Z79899 Other long term (current) drug therapy: Secondary | ICD-10-CM | POA: Diagnosis not present

## 2020-03-11 DIAGNOSIS — K219 Gastro-esophageal reflux disease without esophagitis: Secondary | ICD-10-CM | POA: Diagnosis not present

## 2020-03-11 DIAGNOSIS — E538 Deficiency of other specified B group vitamins: Secondary | ICD-10-CM | POA: Diagnosis not present

## 2020-03-11 DIAGNOSIS — Y9301 Activity, walking, marching and hiking: Secondary | ICD-10-CM | POA: Diagnosis present

## 2020-03-11 DIAGNOSIS — M6281 Muscle weakness (generalized): Secondary | ICD-10-CM | POA: Diagnosis not present

## 2020-03-11 DIAGNOSIS — Z90710 Acquired absence of both cervix and uterus: Secondary | ICD-10-CM | POA: Diagnosis not present

## 2020-03-11 DIAGNOSIS — M109 Gout, unspecified: Secondary | ICD-10-CM | POA: Diagnosis present

## 2020-03-11 DIAGNOSIS — Z7401 Bed confinement status: Secondary | ICD-10-CM | POA: Diagnosis not present

## 2020-03-11 DIAGNOSIS — I69828 Other speech and language deficits following other cerebrovascular disease: Secondary | ICD-10-CM | POA: Diagnosis not present

## 2020-03-11 DIAGNOSIS — N1831 Chronic kidney disease, stage 3a: Secondary | ICD-10-CM | POA: Diagnosis not present

## 2020-03-11 DIAGNOSIS — M2550 Pain in unspecified joint: Secondary | ICD-10-CM | POA: Diagnosis not present

## 2020-03-11 DIAGNOSIS — D649 Anemia, unspecified: Secondary | ICD-10-CM | POA: Diagnosis present

## 2020-03-11 DIAGNOSIS — Z23 Encounter for immunization: Secondary | ICD-10-CM

## 2020-03-11 DIAGNOSIS — E039 Hypothyroidism, unspecified: Secondary | ICD-10-CM | POA: Diagnosis present

## 2020-03-11 DIAGNOSIS — S72421A Displaced fracture of lateral condyle of right femur, initial encounter for closed fracture: Secondary | ICD-10-CM | POA: Diagnosis not present

## 2020-03-11 DIAGNOSIS — S72411A Displaced unspecified condyle fracture of lower end of right femur, initial encounter for closed fracture: Secondary | ICD-10-CM | POA: Diagnosis not present

## 2020-03-11 DIAGNOSIS — E7800 Pure hypercholesterolemia, unspecified: Secondary | ICD-10-CM | POA: Diagnosis present

## 2020-03-11 DIAGNOSIS — S72431A Displaced fracture of medial condyle of right femur, initial encounter for closed fracture: Secondary | ICD-10-CM

## 2020-03-11 DIAGNOSIS — S7291XA Unspecified fracture of right femur, initial encounter for closed fracture: Secondary | ICD-10-CM | POA: Diagnosis not present

## 2020-03-11 DIAGNOSIS — Z419 Encounter for procedure for purposes other than remedying health state, unspecified: Secondary | ICD-10-CM

## 2020-03-11 DIAGNOSIS — Z888 Allergy status to other drugs, medicaments and biological substances status: Secondary | ICD-10-CM | POA: Diagnosis not present

## 2020-03-11 DIAGNOSIS — I959 Hypotension, unspecified: Secondary | ICD-10-CM | POA: Diagnosis not present

## 2020-03-11 DIAGNOSIS — S199XXA Unspecified injury of neck, initial encounter: Secondary | ICD-10-CM | POA: Diagnosis not present

## 2020-03-11 DIAGNOSIS — D62 Acute posthemorrhagic anemia: Secondary | ICD-10-CM | POA: Diagnosis not present

## 2020-03-11 DIAGNOSIS — Z79890 Hormone replacement therapy (postmenopausal): Secondary | ICD-10-CM | POA: Diagnosis not present

## 2020-03-11 DIAGNOSIS — S299XXA Unspecified injury of thorax, initial encounter: Secondary | ICD-10-CM | POA: Diagnosis not present

## 2020-03-11 DIAGNOSIS — I10 Essential (primary) hypertension: Secondary | ICD-10-CM | POA: Diagnosis not present

## 2020-03-11 DIAGNOSIS — S72451A Displaced supracondylar fracture without intracondylar extension of lower end of right femur, initial encounter for closed fracture: Secondary | ICD-10-CM | POA: Diagnosis not present

## 2020-03-11 DIAGNOSIS — S79929A Unspecified injury of unspecified thigh, initial encounter: Secondary | ICD-10-CM | POA: Diagnosis not present

## 2020-03-11 DIAGNOSIS — S72351A Displaced comminuted fracture of shaft of right femur, initial encounter for closed fracture: Secondary | ICD-10-CM | POA: Diagnosis not present

## 2020-03-11 DIAGNOSIS — S72491A Other fracture of lower end of right femur, initial encounter for closed fracture: Secondary | ICD-10-CM | POA: Diagnosis not present

## 2020-03-11 DIAGNOSIS — I129 Hypertensive chronic kidney disease with stage 1 through stage 4 chronic kidney disease, or unspecified chronic kidney disease: Secondary | ICD-10-CM | POA: Diagnosis not present

## 2020-03-11 DIAGNOSIS — Z8673 Personal history of transient ischemic attack (TIA), and cerebral infarction without residual deficits: Secondary | ICD-10-CM | POA: Diagnosis not present

## 2020-03-11 DIAGNOSIS — S72451D Displaced supracondylar fracture without intracondylar extension of lower end of right femur, subsequent encounter for closed fracture with routine healing: Secondary | ICD-10-CM | POA: Diagnosis not present

## 2020-03-11 DIAGNOSIS — I69328 Other speech and language deficits following cerebral infarction: Secondary | ICD-10-CM | POA: Diagnosis not present

## 2020-03-11 DIAGNOSIS — Z9049 Acquired absence of other specified parts of digestive tract: Secondary | ICD-10-CM | POA: Diagnosis not present

## 2020-03-11 DIAGNOSIS — R2681 Unsteadiness on feet: Secondary | ICD-10-CM | POA: Diagnosis not present

## 2020-03-11 DIAGNOSIS — Z881 Allergy status to other antibiotic agents status: Secondary | ICD-10-CM

## 2020-03-11 DIAGNOSIS — E539 Vitamin B deficiency, unspecified: Secondary | ICD-10-CM | POA: Diagnosis not present

## 2020-03-11 DIAGNOSIS — Z961 Presence of intraocular lens: Secondary | ICD-10-CM | POA: Diagnosis not present

## 2020-03-11 DIAGNOSIS — N289 Disorder of kidney and ureter, unspecified: Secondary | ICD-10-CM | POA: Diagnosis not present

## 2020-03-11 DIAGNOSIS — S0990XA Unspecified injury of head, initial encounter: Secondary | ICD-10-CM | POA: Diagnosis not present

## 2020-03-11 DIAGNOSIS — T148XXA Other injury of unspecified body region, initial encounter: Secondary | ICD-10-CM

## 2020-03-11 DIAGNOSIS — E559 Vitamin D deficiency, unspecified: Secondary | ICD-10-CM | POA: Diagnosis not present

## 2020-03-11 DIAGNOSIS — I1310 Hypertensive heart and chronic kidney disease without heart failure, with stage 1 through stage 4 chronic kidney disease, or unspecified chronic kidney disease: Secondary | ICD-10-CM | POA: Diagnosis not present

## 2020-03-11 DIAGNOSIS — R41841 Cognitive communication deficit: Secondary | ICD-10-CM | POA: Diagnosis not present

## 2020-03-11 DIAGNOSIS — Z9181 History of falling: Secondary | ICD-10-CM | POA: Diagnosis not present

## 2020-03-11 DIAGNOSIS — E785 Hyperlipidemia, unspecified: Secondary | ICD-10-CM | POA: Diagnosis not present

## 2020-03-11 DIAGNOSIS — F05 Delirium due to known physiological condition: Secondary | ICD-10-CM | POA: Diagnosis not present

## 2020-03-11 DIAGNOSIS — S72491D Other fracture of lower end of right femur, subsequent encounter for closed fracture with routine healing: Secondary | ICD-10-CM | POA: Diagnosis not present

## 2020-03-11 DIAGNOSIS — R404 Transient alteration of awareness: Secondary | ICD-10-CM | POA: Diagnosis not present

## 2020-03-11 HISTORY — DX: Disorder of kidney and ureter, unspecified: N28.9

## 2020-03-11 LAB — CBC WITH DIFFERENTIAL/PLATELET
Abs Immature Granulocytes: 0.03 10*3/uL (ref 0.00–0.07)
Basophils Absolute: 0 10*3/uL (ref 0.0–0.1)
Basophils Relative: 1 %
Eosinophils Absolute: 0.2 10*3/uL (ref 0.0–0.5)
Eosinophils Relative: 3 %
HCT: 30.5 % — ABNORMAL LOW (ref 36.0–46.0)
Hemoglobin: 9.7 g/dL — ABNORMAL LOW (ref 12.0–15.0)
Immature Granulocytes: 0 %
Lymphocytes Relative: 13 %
Lymphs Abs: 0.9 10*3/uL (ref 0.7–4.0)
MCH: 31.5 pg (ref 26.0–34.0)
MCHC: 31.8 g/dL (ref 30.0–36.0)
MCV: 99 fL (ref 80.0–100.0)
Monocytes Absolute: 0.5 10*3/uL (ref 0.1–1.0)
Monocytes Relative: 7 %
Neutro Abs: 5.6 10*3/uL (ref 1.7–7.7)
Neutrophils Relative %: 76 %
Platelets: 206 10*3/uL (ref 150–400)
RBC: 3.08 MIL/uL — ABNORMAL LOW (ref 3.87–5.11)
RDW: 14 % (ref 11.5–15.5)
WBC: 7.3 10*3/uL (ref 4.0–10.5)
nRBC: 0 % (ref 0.0–0.2)

## 2020-03-11 LAB — COMPREHENSIVE METABOLIC PANEL
ALT: 15 U/L (ref 0–44)
AST: 25 U/L (ref 15–41)
Albumin: 3.8 g/dL (ref 3.5–5.0)
Alkaline Phosphatase: 75 U/L (ref 38–126)
Anion gap: 14 (ref 5–15)
BUN: 23 mg/dL (ref 8–23)
CO2: 22 mmol/L (ref 22–32)
Calcium: 8.3 mg/dL — ABNORMAL LOW (ref 8.9–10.3)
Chloride: 104 mmol/L (ref 98–111)
Creatinine, Ser: 1.61 mg/dL — ABNORMAL HIGH (ref 0.44–1.00)
GFR calc Af Amer: 33 mL/min — ABNORMAL LOW (ref >60)
GFR calc non Af Amer: 29 mL/min — ABNORMAL LOW (ref >60)
Glucose, Bld: 112 mg/dL — ABNORMAL HIGH (ref 70–99)
Potassium: 3.4 mmol/L — ABNORMAL LOW (ref 3.5–5.1)
Sodium: 140 mmol/L (ref 135–145)
Total Bilirubin: 0.8 mg/dL (ref 0.3–1.2)
Total Protein: 7 g/dL (ref 6.5–8.1)

## 2020-03-11 LAB — IRON AND TIBC
Iron: 21 ug/dL — ABNORMAL LOW (ref 28–170)
Saturation Ratios: 7 % — ABNORMAL LOW (ref 10.4–31.8)
TIBC: 318 ug/dL (ref 250–450)
UIBC: 297 ug/dL

## 2020-03-11 LAB — PROTIME-INR
INR: 1.1 (ref 0.8–1.2)
Prothrombin Time: 13.9 seconds (ref 11.4–15.2)

## 2020-03-11 LAB — FERRITIN: Ferritin: 34 ng/mL (ref 11–307)

## 2020-03-11 LAB — MAGNESIUM: Magnesium: 1 mg/dL — ABNORMAL LOW (ref 1.7–2.4)

## 2020-03-11 LAB — SARS CORONAVIRUS 2 BY RT PCR (HOSPITAL ORDER, PERFORMED IN ~~LOC~~ HOSPITAL LAB): SARS Coronavirus 2: NEGATIVE

## 2020-03-11 MED ORDER — ALLOPURINOL 100 MG PO TABS
200.0000 mg | ORAL_TABLET | Freq: Every day | ORAL | Status: DC
  Administered 2020-03-13 – 2020-03-15 (×3): 200 mg via ORAL
  Filled 2020-03-11 (×4): qty 2

## 2020-03-11 MED ORDER — AMLODIPINE BESYLATE 5 MG PO TABS: 5.0000 mg | ORAL_TABLET | Freq: Every morning | ORAL | Status: DC

## 2020-03-11 MED ORDER — NEBIVOLOL HCL 10 MG PO TABS
10.0000 mg | ORAL_TABLET | Freq: Every day | ORAL | Status: DC
  Administered 2020-03-12 – 2020-03-15 (×4): 10 mg via ORAL
  Filled 2020-03-11 (×6): qty 1

## 2020-03-11 MED ORDER — PANTOPRAZOLE SODIUM 20 MG PO TBEC
20.0000 mg | DELAYED_RELEASE_TABLET | Freq: Every day | ORAL | Status: DC
  Administered 2020-03-13 – 2020-03-15 (×3): 20 mg via ORAL
  Filled 2020-03-11 (×5): qty 1

## 2020-03-11 MED ORDER — ACETAMINOPHEN 500 MG PO TABS
500.0000 mg | ORAL_TABLET | Freq: Four times a day (QID) | ORAL | Status: DC | PRN
  Administered 2020-03-12: 500 mg via ORAL
  Filled 2020-03-11: qty 1

## 2020-03-11 MED ORDER — POLYVINYL ALCOHOL 1.4 % OP SOLN
Freq: Two times a day (BID) | OPHTHALMIC | Status: DC | PRN
  Filled 2020-03-11: qty 15

## 2020-03-11 MED ORDER — HEPARIN SODIUM (PORCINE) 5000 UNIT/ML IJ SOLN
5000.0000 [IU] | Freq: Two times a day (BID) | INTRAMUSCULAR | Status: DC
  Administered 2020-03-11: 5000 [IU] via SUBCUTANEOUS
  Filled 2020-03-11: qty 1

## 2020-03-11 MED ORDER — HYDROCODONE-ACETAMINOPHEN 5-325 MG PO TABS
1.0000 | ORAL_TABLET | Freq: Four times a day (QID) | ORAL | Status: DC | PRN
  Administered 2020-03-13: 1 via ORAL
  Filled 2020-03-11: qty 1

## 2020-03-11 MED ORDER — AMLODIPINE BESYLATE 5 MG PO TABS
5.0000 mg | ORAL_TABLET | Freq: Every day | ORAL | Status: DC
  Administered 2020-03-12 – 2020-03-13 (×2): 5 mg via ORAL
  Filled 2020-03-11 (×2): qty 1

## 2020-03-11 MED ORDER — ASPIRIN EC 81 MG PO TBEC
81.0000 mg | DELAYED_RELEASE_TABLET | Freq: Every day | ORAL | Status: DC
  Administered 2020-03-12 – 2020-03-15 (×4): 81 mg via ORAL
  Filled 2020-03-11 (×4): qty 1

## 2020-03-11 MED ORDER — LEVOTHYROXINE SODIUM 75 MCG PO TABS
75.0000 ug | ORAL_TABLET | Freq: Every day | ORAL | Status: DC
  Administered 2020-03-12 – 2020-03-15 (×4): 75 ug via ORAL
  Filled 2020-03-11 (×4): qty 1

## 2020-03-11 MED ORDER — HYDRALAZINE HCL 50 MG PO TABS
100.0000 mg | ORAL_TABLET | Freq: Three times a day (TID) | ORAL | Status: DC
  Administered 2020-03-11 – 2020-03-14 (×7): 100 mg via ORAL
  Filled 2020-03-11 (×9): qty 2

## 2020-03-11 MED ORDER — CHLORHEXIDINE GLUCONATE 4 % EX LIQD
60.0000 mL | Freq: Once | CUTANEOUS | Status: AC
  Administered 2020-03-12: 4 via TOPICAL
  Filled 2020-03-11: qty 60

## 2020-03-11 MED ORDER — SPIRONOLACTONE 12.5 MG HALF TABLET
12.5000 mg | ORAL_TABLET | Freq: Every day | ORAL | Status: DC
  Administered 2020-03-13 – 2020-03-15 (×3): 12.5 mg via ORAL
  Filled 2020-03-11 (×4): qty 1

## 2020-03-11 MED ORDER — POTASSIUM CHLORIDE CRYS ER 20 MEQ PO TBCR
40.0000 meq | EXTENDED_RELEASE_TABLET | Freq: Once | ORAL | Status: AC
  Administered 2020-03-12: 40 meq via ORAL
  Filled 2020-03-11: qty 2

## 2020-03-11 MED ORDER — SENNOSIDES-DOCUSATE SODIUM 8.6-50 MG PO TABS: 1.0000 | ORAL_TABLET | Freq: Every evening | ORAL | Status: DC | PRN

## 2020-03-11 MED ORDER — MORPHINE SULFATE (PF) 2 MG/ML IV SOLN
0.5000 mg | INTRAVENOUS | Status: DC | PRN
  Administered 2020-03-12 (×2): 0.5 mg via INTRAVENOUS
  Filled 2020-03-11 (×2): qty 1

## 2020-03-11 MED ORDER — ATORVASTATIN CALCIUM 10 MG PO TABS
10.0000 mg | ORAL_TABLET | Freq: Every day | ORAL | Status: DC
  Administered 2020-03-12 – 2020-03-15 (×4): 10 mg via ORAL
  Filled 2020-03-11 (×4): qty 1

## 2020-03-11 MED ORDER — FUROSEMIDE 20 MG PO TABS
20.0000 mg | ORAL_TABLET | ORAL | Status: DC
  Administered 2020-03-15: 20 mg via ORAL
  Filled 2020-03-11 (×2): qty 1

## 2020-03-11 MED ORDER — ENSURE PRE-SURGERY PO LIQD
296.0000 mL | Freq: Once | ORAL | Status: AC
  Administered 2020-03-11: 296 mL via ORAL
  Filled 2020-03-11: qty 296

## 2020-03-11 NOTE — H&P (Signed)
History and Physical    Jennifer Patel UEA:[id number] DOB: [birth year] DOA: 03/11/2020  PCP: Anda Kraft, MD (Confirm with patient/family/NH records and if not entered, this has to be entered at [facility] point of entry) Patient coming from: Home  I have personally briefly reviewed patient's old medical records in [place]  Chief Complaint: Right knee pain  HPI: Jennifer Patel is a 86 y.o. female with medical history significant of refractory HTN on multiple BP meds, remote TIA in 1990s, hypothyroidism, gout, HLD, presented with a mechanical fall.  Patient lives by herself, and today she did not help for a walk and slipped on some pine needles while climbing uphill and landed on her right knee. She felt severe pain on the right knee and could not get up or bear weight.  She denied any prodrome such as lightheaded or numbness or weakness of any of the limbs before this happened, she further denied any chest pain or short of breath.  She lives by herself, overall she has very good activity tolerance, she walks her dog daily for about 15 to 20 minutes without any chest pain or short of breath.  She lives in a two-story house but only stay in the first floor after her husband death ED Course: Xray showed right sided distal femoral fracture  Review of Systems: As per HPI otherwise 10 point review of systems negative.    Past Medical History:  Diagnosis Date  . High cholesterol   . Hypertension   . Thyroid disease     Past Surgical History:  Procedure Laterality Date  . ABDOMINAL HYSTERECTOMY    . CHOLECYSTECTOMY    . PERIPHERAL VASCULAR CATHETERIZATION N/A 11/16/2015   Procedure: Renal Angiography;  Surgeon: Adrian Prows, MD;  Location: [place] [facility];  Service: Cardiovascular;  Laterality: N/A;     reports that she has quit smoking. She has never used smokeless tobacco. She reports that she does not drink alcohol or use drugs.  Allergies  Allergen Reactions  . Clarithromycin  Anaphylaxis and Swelling    Throat swelling  . Prednisone Anaphylaxis and Swelling    Throat swelling and tightness from throat to stomach (05/22/15)    No family history on file.   Prior to Admission medications   Medication Sig Start Date End Date Taking? Authorizing Provider  acetaminophen (TYLENOL) 500 MG tablet Take 500 mg by mouth every 6 (six) hours as needed (pain).    [provider]  allopurinol (ZYLOPRIM) 100 MG tablet Take 200 mg by mouth daily.  06/12/14   [provider]  aspirin EC 81 MG tablet Take 81 mg by mouth daily.    [provider]  atorvastatin (LIPITOR) 10 MG tablet Take 10 mg by mouth daily. Reported on 11/11/2015    [provider]  Cyanocobalamin (VITAMIN B-12 IJ) Inject as directed every 30 (thirty) days. Next injection due 05/24/15 at Dr. Eugenio Hoes office    [provider]  diclofenac sodium (VOLTAREN) 1 % GEL Apply 2 g topically 4 (four) times daily as needed. 05/08/19   Long, Wonda Olds, MD  hydrALAZINE (APRESOLINE) 100 MG tablet Take 100 mg by mouth 3 (three) times daily.    [provider]  lansoprazole (PREVACID) 15 MG capsule Take 15 mg by mouth daily at 12 noon.    [provider]  levothyroxine (SYNTHROID) 75 MCG tablet Take 1 tablet by mouth daily. 06/06/17   [provider]  Nebivolol HCl (BYSTOLIC) 20 MG TABS  Take 1 tablet by mouth daily. 08/13/17   [provider]  Polyethyl Glycol-Propyl Glycol (SYSTANE OP) Place 1 drop into both eyes 2 (two) times daily as needed (dry eyes).    [provider]  spironolactone (ALDACTONE) 25 MG tablet Take 12.5 mg by mouth daily.     [provider]  valsartan (DIOVAN) 320 MG tablet Take 320 mg by mouth daily.    Adrian Prows, MD    Physical Exam: Vitals:   03/11/20 1204 03/11/20 1215 03/11/20 1230 03/11/20 1245  BP: (!) 132/98 (!) 154/66 (!) 129/95 (!) 159/65  Pulse: 76 75 73 76  Resp: (!) 22 18 20  (!) 24  Temp: 98.3 F  (36.8 C)     TempSrc: Oral     SpO2: 94% 94% 93% 93%  Weight: 45.4 kg     Height: 3\' 11"  (1.194 m)       Constitutional: NAD, calm, comfortable Vitals:   03/11/20 1204 03/11/20 1215 03/11/20 1230 03/11/20 1245  BP: (!) 132/98 (!) 154/66 (!) 129/95 (!) 159/65  Pulse: 76 75 73 76  Resp: (!) 22 18 20  (!) 24  Temp: 98.3 F (36.8 C)     TempSrc: Oral     SpO2: 94% 94% 93% 93%  Weight: 45.4 kg     Height: 3\' 11"  (1.194 m)      Eyes: PERRL, lids and conjunctivae normal ENMT: Mucous membranes are moist. Posterior pharynx clear of any exudate or lesions.Normal dentition.  Neck: normal, supple, no masses, no thyromegaly Respiratory: clear to auscultation bilaterally, no wheezing, no crackles. Normal respiratory effort. No accessory muscle use.  Cardiovascular: Regular rate and rhythm, soft systolic murmur heart base. No extremity edema. 2+ pedal pulses. No carotid bruits.  Abdomen: no tenderness, no masses palpated. No hepatosplenomegaly. Bowel sounds positive.  Musculoskeletal: Right knee swelling and tenderness Skin: no rashes, lesions, ulcers. No induration Neurologic: CN 2-12 grossly intact. Sensation intact, DTR normal. Strength 5/5 in all 4.  Psychiatric: Normal judgment and insight. Alert and oriented x 3. Normal mood.     Labs on Admission: I have personally reviewed following labs and imaging studies  CBC: Recent Labs  Lab 03/11/20 1307  WBC 7.3  NEUTROABS 5.6  HGB 9.7*  HCT 30.5*  MCV 99.0  PLT 244   Basic Metabolic Panel: Recent Labs  Lab 03/11/20 1307  NA 140  K 3.4*  CL 104  CO2 22  GLUCOSE 112*  BUN 23  CREATININE 1.61*  CALCIUM 8.3*   GFR: Estimated Creatinine Clearance: 11.1 mL/min (A) (by C-G formula based on SCr of 1.61 mg/dL (H)). Liver Function Tests: Recent Labs  Lab 03/11/20 1307  AST 25  ALT 15  ALKPHOS 75  BILITOT 0.8  PROT 7.0  ALBUMIN 3.8   No results for input(s): LIPASE, AMYLASE in the last 168 hours. No results for input(s):  AMMONIA in the last 168 hours. Coagulation Profile: Recent Labs  Lab 03/11/20 1307  INR 1.1   Cardiac Enzymes: No results for input(s): CKTOTAL, CKMB, CKMBINDEX, TROPONINI in the last 168 hours. BNP (last 3 results) No results for input(s): PROBNP in the last 8760 hours. HbA1C: No results for input(s): HGBA1C in the last 72 hours. CBG: No results for input(s): GLUCAP in the last 168 hours. Lipid Profile: No results for input(s): CHOL, HDL, LDLCALC, TRIG, CHOLHDL, LDLDIRECT in the last 72 hours. Thyroid Function Tests: No results for input(s): TSH, T4TOTAL, FREET4, T3FREE, THYROIDAB in the last 72 hours. Anemia Panel: No results  for input(s): VITAMINB12, FOLATE, FERRITIN, TIBC, IRON, RETICCTPCT in the last 72 hours. Urine analysis:    Component Value Date/Time   COLORURINE YELLOW 08/17/2015 [place] 08/17/2015 1145   LABSPEC 1.009 08/17/2015 1145   PHURINE 5.5 08/17/2015 1145   GLUCOSEU NEGATIVE 08/17/2015 1145   HGBUR NEGATIVE 08/17/2015 1145   BILIRUBINUR NEGATIVE 08/17/2015 1145   KETONESUR NEGATIVE 08/17/2015 1145   PROTEINUR NEGATIVE 08/17/2015 1145   UROBILINOGEN 0.2 08/17/2015 1145   NITRITE NEGATIVE 08/17/2015 1145   LEUKOCYTESUR NEGATIVE 08/17/2015 1145    Radiological Exams on Admission: DG Knee 1-2 Views Right  Result Date: 03/11/2020 CLINICAL DATA:  Fall with pain. EXAM: RIGHT KNEE - 1-2 VIEW COMPARISON:  None. FINDINGS: Comminuted displaced fracture of the distal femoral metaphyseal region. Anterior displacement of the proximal fragment by the with of the bone. Relationship between the femur and the tibia remain intact. No fracture line seen extending to the articular surface. No patellar fracture. IMPRESSION: Comminuted fracture of the femoral metaphysis with posterior displacement of the distal fracture fragments. Electronically Signed   By: Nelson Chimes M.D.   On: 03/11/2020 12:38   CT Head Wo Contrast  Result Date: 03/11/2020 CLINICAL  DATA:  Fall earlier today. EXAM: CT HEAD WITHOUT CONTRAST CT CERVICAL SPINE WITHOUT CONTRAST TECHNIQUE: Multidetector CT imaging of the head and cervical spine was performed following the standard protocol without intravenous contrast. Multiplanar CT image reconstructions of the cervical spine were also generated. COMPARISON:  06/23/2008 FINDINGS: CT HEAD FINDINGS Brain: Ventricles and cisterns are within normal. There is mild age related atrophic change. There is chronic ischemic microvascular disease. No mass, mass effect, shift of midline structures or acute hemorrhage. No evidence of acute infarction. Vascular: No hyperdense vessel or unexpected calcification. Skull: Normal. Negative for fracture or focal lesion. Sinuses/Orbits: No acute finding. Other: None. CT CERVICAL SPINE FINDINGS Alignment: No evidence of posttraumatic subluxation. Patient is kyphotic. Skull base and vertebrae: Mild spondylosis throughout the cervical spine. Atlantoaxial articulation is unremarkable. There is moderate facet arthropathy. No acute fracture or subluxation. Soft tissues and spinal canal: No prevertebral fluid or swelling. No visible canal hematoma. Disc levels:  Minimal disc space narrowing at the C5-6 level. Upper chest: No acute findings. Other: None. IMPRESSION: 1.  No acute brain injury. 2.  Age related atrophy and chronic ischemic microvascular disease. 3.  No acute cervical spine injury. 4. Mild spondylosis of the cervical spine with minimal disc disease at the C5-6 level. Electronically Signed   By: Marin Olp M.D.   On: 03/11/2020 15:03   CT Cervical Spine Wo Contrast  Result Date: 03/11/2020 CLINICAL DATA:  Fall earlier today. EXAM: CT HEAD WITHOUT CONTRAST CT CERVICAL SPINE WITHOUT CONTRAST TECHNIQUE: Multidetector CT imaging of the head and cervical spine was performed following the standard protocol without intravenous contrast. Multiplanar CT image reconstructions of the cervical spine were also generated.  COMPARISON:  06/23/2008 FINDINGS: CT HEAD FINDINGS Brain: Ventricles and cisterns are within normal. There is mild age related atrophic change. There is chronic ischemic microvascular disease. No mass, mass effect, shift of midline structures or acute hemorrhage. No evidence of acute infarction. Vascular: No hyperdense vessel or unexpected calcification. Skull: Normal. Negative for fracture or focal lesion. Sinuses/Orbits: No acute finding. Other: None. CT CERVICAL SPINE FINDINGS Alignment: No evidence of posttraumatic subluxation. Patient is kyphotic. Skull base and vertebrae: Mild spondylosis throughout the cervical spine. Atlantoaxial articulation is unremarkable. There is moderate facet arthropathy. No acute fracture or subluxation. Soft tissues  and spinal canal: No prevertebral fluid or swelling. No visible canal hematoma. Disc levels:  Minimal disc space narrowing at the C5-6 level. Upper chest: No acute findings. Other: None. IMPRESSION: 1.  No acute brain injury. 2.  Age related atrophy and chronic ischemic microvascular disease. 3.  No acute cervical spine injury. 4. Mild spondylosis of the cervical spine with minimal disc disease at the C5-6 level. Electronically Signed   By: Marin Olp M.D.   On: 03/11/2020 15:03   CT KNEE RIGHT WO CONTRAST  Result Date: 03/11/2020 CLINICAL DATA:  Evaluate distal femur fracture EXAM: CT OF THE RIGHT KNEE WITHOUT CONTRAST TECHNIQUE: Multidetector CT imaging of the right knee was performed according to the standard protocol. Multiplanar CT image reconstructions were also generated. COMPARISON:  X-ray 03/11/2020 FINDINGS: Bones/Joint/Cartilage Redemonstration of an acute comminuted fracture of the distal femoral metaphysis with approximately 1 shaft width of posterior displacement. Fracture is also slightly medially displaced. The proximal fracture component abuts the superior pole of the patella (series 20, image 38). No evidence of patellar fracture.  Tricompartmental osteoarthritis of the right knee is most pronounced within the lateral tibiofemoral compartment with moderate-severe joint space narrowing, subchondral sclerosis, subchondral cystic changes and prominent marginal osteophytosis. The proximal tibia and fibula appear intact without fracture. There is a large lipohemarthrosis. Ligaments Suboptimally assessed by CT. Muscles and Tendons No definite acute tendinous injury. The quadriceps and patellar tendons appear grossly intact. Grossly preserved muscle bulk. Soft tissues There is soft tissue hemorrhage adjacent to the fracture site. No soft tissue air to suggest open fracture. IMPRESSION: 1. Acute comminuted fracture of the distal femoral metaphysis with approximately 1 shaft width of posterior displacement. The proximal fracture component abuts the superior pole of the patella without evidence of patellar fracture hila are. 2. Large lipohemarthrosis. 3. Tricompartmental osteoarthritis of the right knee is most pronounced within the lateral tibiofemoral compartment. Electronically Signed   By: Davina Poke D.O.   On: 03/11/2020 15:15   DG Chest Port 1 View  Result Date: 03/11/2020 CLINICAL DATA:  Fall. EXAM: PORTABLE CHEST 1 VIEW COMPARISON:  May 08, 2019. FINDINGS: Stable cardiomegaly. Atherosclerosis of thoracic aorta is noted. No pneumothorax or pleural effusion is noted. Lungs are clear. Large hiatal hernia is noted. Bony thorax is unremarkable. IMPRESSION: Aortic atherosclerosis. Large hiatal hernia. No acute cardiopulmonary abnormality seen. Aortic Atherosclerosis (ICD10-I70.0). Electronically Signed   By: Marijo Conception M.D.   On: 03/11/2020 12:39    EKG: Independently reviewed.  Minimal ST depression none multiple leads, seems chronic compared to her EKG in 2016  Assessment/Plan Active Problems:   * No active hospital problems. *  (please populate well all problems here in Problem List. (For example, if patient is on BP meds at  home and you resume or decide to hold them, it is a problem that needs to be her. Same for CAD, COPD, HLD and so on)  Right femoral fracture secondary to mechanical fall -Orthopedic surgery plan to take patient for ORIF tomorrow -HEART score 4 implying a moderate score with 6 weeks risk of major cardiac events 12-16.6%, but patient tolerated 4 METS activity at home well never had chest pain or short of breath, cleared for right knee ORIF with acceptable risk -Echo to evaluate wall motion -EKG no significant ST changes compared to 5 years ago  Resistant hypertension -Fairly controlled, continue home regimen including Lasix ARB, beta-blocker, hydralazine and Norvasc  CKD stage III -Cre level about baseline and clinically she is euvolemic -  Continue home BP regimen for now, move Norvasc nightly  Hypokalemia -Replace and recheck -Check magnesium level  Remote Hx of TIA -No acute issue    DVT prophylaxis: Heparin subcu Code Status: Full code Family Communication: None at bedside Disposition Plan: Peripheral discharge, ORIF and PT evaluation, may need rehab versus SNF Consults called: Orthopedic surgery Admission status: MedSurg   Lequita Halt MD [facility] Pager [phone]    03/11/2020, 4:49 PM

## 2020-03-11 NOTE — Progress Notes (Signed)
Orthopedic Tech Progress Note Patient Details:  MELVIN MARMO Mar 11, 1932 [id number]   Ortho Devices Type of Ortho Device: Knee Immobilizer Ortho Device/Splint Location: Lower extremity - Right Knee Ortho Device/Splint Interventions: Ordered, Application   Post Interventions Patient Tolerated: Fair Instructions Provided: Adjustment of device, Care of device, Poper ambulation with device   Tammy Sours 03/11/2020, 1:39 PM

## 2020-03-11 NOTE — ED Triage Notes (Signed)
Pt brought in by [facility] from home following a fall. Pt was found by neighbors outside, states she slipped and fell on pine needles. Pt endorses right knee pain, significant swelling noted. Per EMS pt right leg presents with shortening and outward rotation. Pt denies pain/tenderness to hip/pelvis. Pt states she hit her head but did not lose consciousness. Pt is not on blood thinners, is A+Ox4.

## 2020-03-11 NOTE — Consult Note (Addendum)
Reason for Consult:Right distal femur fx Referring Physician: E Jennifer Patel is an 86 y.o. female.  HPI: Jennifer Patel was outside her home today and slipped on some pine needles. She fell to the ground and had immediate right knee pain and could not get up or bear weight. She was brought to the ED where x-rays showed a distal femur fx and orthopedic surgery was consulted. She lives alone and does not use any assistive devices to ambulate.  Past Medical History:  Diagnosis Date  . High cholesterol   . Hypertension   . Thyroid disease     Past Surgical History:  Procedure Laterality Date  . ABDOMINAL HYSTERECTOMY    . CHOLECYSTECTOMY    . PERIPHERAL VASCULAR CATHETERIZATION N/A 11/16/2015   Procedure: Renal Angiography;  Surgeon: Adrian Prows, MD;  Location: [place] [facility];  Service: Cardiovascular;  Laterality: N/A;    No family history on file.  Social History:  reports that she has quit smoking. She has never used smokeless tobacco. She reports that she does not drink alcohol or use drugs.  Allergies:  Allergies  Allergen Reactions  . Clarithromycin Anaphylaxis and Swelling    Throat swelling  . Prednisone Anaphylaxis and Swelling    Throat swelling and tightness from throat to stomach (05/22/15)    Medications: I have reviewed the patient's current medications.  No results found for this or any previous visit (from the past 48 hour(s)).  DG Knee 1-2 Views Right  Result Date: 03/11/2020 CLINICAL DATA:  Fall with pain. EXAM: RIGHT KNEE - 1-2 VIEW COMPARISON:  None. FINDINGS: Comminuted displaced fracture of the distal femoral metaphyseal region. Anterior displacement of the proximal fragment by the with of the bone. Relationship between the femur and the tibia remain intact. No fracture line seen extending to the articular surface. No patellar fracture. IMPRESSION: Comminuted fracture of the femoral metaphysis with posterior displacement of the distal fracture fragments.  Electronically Signed   By: Nelson Chimes M.D.   On: 03/11/2020 12:38   DG Chest Port 1 View  Result Date: 03/11/2020 CLINICAL DATA:  Fall. EXAM: PORTABLE CHEST 1 VIEW COMPARISON:  May 08, 2019. FINDINGS: Stable cardiomegaly. Atherosclerosis of thoracic aorta is noted. No pneumothorax or pleural effusion is noted. Lungs are clear. Large hiatal hernia is noted. Bony thorax is unremarkable. IMPRESSION: Aortic atherosclerosis. Large hiatal hernia. No acute cardiopulmonary abnormality seen. Aortic Atherosclerosis (ICD10-I70.0). Electronically Signed   By: Marijo Conception M.D.   On: 03/11/2020 12:39    Review of Systems  HENT: Negative for ear discharge, ear pain, hearing loss and tinnitus.   Eyes: Negative for photophobia and pain.  Respiratory: Negative for cough and shortness of breath.   Cardiovascular: Negative for chest pain.  Gastrointestinal: Negative for abdominal pain, nausea and vomiting.  Genitourinary: Negative for dysuria, flank pain, frequency and urgency.  Musculoskeletal: Positive for arthralgias (Right knee). Negative for back pain, myalgias and neck pain.  Neurological: Negative for dizziness and headaches.  Hematological: Does not bruise/bleed easily.  Psychiatric/Behavioral: The patient is not nervous/anxious.    Blood pressure (!) 132/98, pulse 76, temperature 98.3 F (36.8 C), temperature source Oral, resp. rate (!) 22, height 3\' 11"  (1.194 m), weight 45.4 kg, SpO2 94 %. Physical Exam  Constitutional: She appears well-developed and well-nourished. No distress.  HENT:  Head: Normocephalic and atraumatic.  Eyes: Conjunctivae are normal. Right eye exhibits no discharge. Left eye exhibits no discharge. No scleral icterus.  Cardiovascular: Normal rate and regular  rhythm.  Respiratory: Effort normal. No respiratory distress.  Musculoskeletal:     Cervical back: Normal range of motion.     Comments: RLE No traumatic wounds, ecchymosis, or rash  Mod TTP knee, swollen  No  ankle effusion  Sens DPN, SPN, TN intact  Motor EHL, ext, flex, evers 5/5  DP 1+, PT 0, No significant edema  Neurological: She is alert.  Skin: Skin is warm and dry. She is not diaphoretic.  Psychiatric: She has a normal mood and affect. Her behavior is normal.    Assessment/Plan: Right distal femur fx -- Plan ORIF tomorrow with Dr. Doreatha Martin. NPO after MN. Multiple medical problems including gout, HTN, HLD, GERD, and thyroid disease -- Will ask IM to admit and clear. Appreciate their help.    Lisette Abu, PA-C Orthopedic Surgery [phone] 03/11/2020, 1:12 PM

## 2020-03-11 NOTE — ED Provider Notes (Signed)
[facility]ERGENC[facility] DEPARTMENT Provider Note   CSN: [id number] Arrival date & time: 03/11/20  1159     History Chief Complaint  Patient presents with  . Fall  . Knee Pain    DUTCHESS CROSLAND is a 86 y.o. female.  The history is provided by the patient and medical records. No language interpreter was used.  Fall  Knee Pain  DOMONIC HISCOX is a 86 y.o. female who presents to the Emergency Department complaining of fall and knee pain. She presents the emergency department by EMS for evaluation of injuries following a fall. She returned home today following a B12 injection at the office when she slipped walking on some pine needles and fell to the ground. She complains of severe pain to her left knee. She did hit her head but did not lose consciousness. She was unable to get back up. She states that when she tried to get up her leg went the wrong direction. She denies any recent illnesses. Denies associated chest pain, shortness of breath, abdominal pain, fevers, nausea, vomiting.    Past Medical History:  Diagnosis Date  . High cholesterol   . Hypertension   . Thyroid disease     Patient Active Problem List   Diagnosis Date Noted  . Resistant hypertension 11/13/2015  . AKI (acute kidney injury) ([place]) 05/22/2015  . Abnormal EKG 05/22/2015  . Hypertension 05/22/2015  . Hypothyroidism 05/22/2015  . Hyperlipidemia 05/22/2015  . Neck pain 05/22/2015    Past Surgical History:  Procedure Laterality Date  . ABDOMINAL HYSTERECTOMY    . CHOLECYSTECTOMY    . PERIPHERAL VASCULAR CATHETERIZATION N/A 11/16/2015   Procedure: Renal Angiography;  Surgeon: Adrian Prows, MD;  Location: [facility];  Service: Cardiovascular;  Laterality: N/A;     OB History   No obstetric history on file.     No family history on file.  Social History   Tobacco Use  . Smoking status: Former Research scientist (life sciences)  . Smokeless tobacco: Never Used  Substance Use Topics  . Alcohol use: No      Alcohol/week: 0.0 standard drinks  . Drug use: No    Home Medications Prior to Admission medications   Medication Sig Start Date End Date Taking? Authorizing Provider  acetaminophen (TYLENOL) 500 MG tablet Take 500 mg by mouth every 6 (six) hours as needed (pain).    [provider]  allopurinol (ZYLOPRIM) 100 MG tablet Take 200 mg by mouth daily.  06/12/14   [provider]  aspirin EC 81 MG tablet Take 81 mg by mouth daily.    [provider]  atorvastatin (LIPITOR) 10 MG tablet Take 10 mg by mouth daily. Reported on 11/11/2015    [provider]  Cyanocobalamin (VITAMIN B-12 IJ) Inject as directed every 30 (thirty) days. Next injection due 05/24/15 at Dr. Eugenio Hoes office    [provider]  diclofenac sodium (VOLTAREN) 1 % GEL Apply 2 g topically 4 (four) times daily as needed. 05/08/19   Long, Wonda Olds, MD  hydrALAZINE (APRESOLINE) 100 MG tablet Take 100 mg by mouth 3 (three) times daily.    [provider]  lansoprazole (PREVACID) 15 MG capsule Take 15 mg by mouth daily at 12 noon.    [provider]  levothyroxine (SYNTHROID) 75 MCG tablet Take 1 tablet by mouth daily. 06/06/17   [provider]  Nebivolol HCl (BYSTOLIC) 20 MG TABS Take 1 tablet by mouth daily. 08/13/17   [provider]  Polyethyl Glycol-Propyl Glycol (SYSTANE OP) Place 1 drop into both eyes 2 (two) times daily as needed (dry eyes).    [provider]  spironolactone (ALDACTONE) 25 MG tablet Take 12.5 mg by mouth daily.     [provider]  valsartan (DIOVAN) 320 MG tablet Take 320 mg by mouth daily.    Adrian Prows, MD    Allergies    Clarithromycin and Prednisone  Review of Systems   Review of Systems  All other systems reviewed and are negative.   Physical Exam Updated Vital Signs BP (!) 159/65   Pulse 76   Temp 98.3 F (36.8 C) (Oral)   Resp (!) 24   Ht 3\' 11"  (1.194 m)   Wt 45.4 kg   SpO2 93%   BMI 31.83  kg/m   Physical Exam Vitals and nursing note reviewed.  Constitutional:      Appearance: She is well-developed.  HENT:     Head: Normocephalic.     Comments: Mild forehead contusion Cardiovascular:     Rate and Rhythm: Normal rate and regular rhythm.     Heart sounds: No murmur.  Pulmonary:     Effort: Pulmonary effort is normal. No respiratory distress.     Breath sounds: Normal breath sounds.  Abdominal:     Palpations: Abdomen is soft.     Tenderness: There is no abdominal tenderness. There is no guarding or rebound.  Musculoskeletal:     Comments: 2+ DP pulses bilaterally.  There is swelling, tenderness and deformity to the right knee.   Skin:    General: Skin is warm and dry.  Neurological:     Mental Status: She is alert and oriented to person, place, and time.  Psychiatric:        Behavior: Behavior normal.     ED Results / Procedures / Treatments   Labs (all labs ordered are listed, but only abnormal results are displayed) Labs Reviewed  COMPREHENSIVE METABOLIC PANEL - Abnormal; Notable for the following components:      Result Value   Potassium 3.4 (*)    Glucose, Bld 112 (*)    Creatinine, Ser 1.61 (*)    Calcium 8.3 (*)    GFR calc non Af Amer 29 (*)    GFR calc Af Amer 33 (*)    All other components within normal limits  CBC WITH DIFFERENTIAL/PLATELET - Abnormal; Notable for the following components:   RBC 3.08 (*)    Hemoglobin 9.7 (*)    HCT 30.5 (*)    All other components within normal limits  SARS CORONAVIRUS 2 BY RT PCR (HOSPITAL ORDER, [facility])  PROTIME-INR  TYPE AND SCREEN    EKG EKG Interpretation  Date/Time:  Thursday Mar 11 2020 12:03:11 EDT Ventricular Rate:  74 PR Interval:    QRS Duration: 91 QT Interval:  449 QTC Calculation: 499 R Axis:   61 Text Interpretation: Sinus rhythm Borderline short PR interval Minimal ST depression, diffuse leads Borderline prolonged QT interval Confirmed by Quintella Reichert [phone]) on 03/11/2020 12:04:09 PM   Radiology DG Knee 1-2 Views Right  Result Date: 03/11/2020 CLINICAL DATA:  Fall with pain. EXAM: RIGHT KNEE - 1-2 VIEW COMPARISON:  None. FINDINGS: Comminuted displaced fracture of the distal femoral metaphyseal region. Anterior displacement of the proximal fragment by the with of the bone. Relationship between the femur and the tibia remain intact. No fracture line seen extending to the articular surface. No patellar  fracture. IMPRESSION: Comminuted fracture of the femoral metaphysis with posterior displacement of the distal fracture fragments. Electronically Signed   By: Nelson Chimes M.D.   On: 03/11/2020 12:38   CT Head Wo Contrast  Result Date: 03/11/2020 CLINICAL DATA:  Fall earlier today. EXAM: CT HEAD WITHOUT CONTRAST CT CERVICAL SPINE WITHOUT CONTRAST TECHNIQUE: Multidetector CT imaging of the head and cervical spine was performed following the standard protocol without intravenous contrast. Multiplanar CT image reconstructions of the cervical spine were also generated. COMPARISON:  06/23/2008 FINDINGS: CT HEAD FINDINGS Brain: Ventricles and cisterns are within normal. There is mild age related atrophic change. There is chronic ischemic microvascular disease. No mass, mass effect, shift of midline structures or acute hemorrhage. No evidence of acute infarction. Vascular: No hyperdense vessel or unexpected calcification. Skull: Normal. Negative for fracture or focal lesion. Sinuses/Orbits: No acute finding. Other: None. CT CERVICAL SPINE FINDINGS Alignment: No evidence of posttraumatic subluxation. Patient is kyphotic. Skull base and vertebrae: Mild spondylosis throughout the cervical spine. Atlantoaxial articulation is unremarkable. There is moderate facet arthropathy. No acute fracture or subluxation. Soft tissues and spinal canal: No prevertebral fluid or swelling. No visible canal hematoma. Disc levels:  Minimal disc space narrowing at the C5-6 level.  Upper chest: No acute findings. Other: None. IMPRESSION: 1.  No acute brain injury. 2.  Age related atrophy and chronic ischemic microvascular disease. 3.  No acute cervical spine injury. 4. Mild spondylosis of the cervical spine with minimal disc disease at the C5-6 level. Electronically Signed   By: Marin Olp M.D.   On: 03/11/2020 15:03   CT Cervical Spine Wo Contrast  Result Date: 03/11/2020 CLINICAL DATA:  Fall earlier today. EXAM: CT HEAD WITHOUT CONTRAST CT CERVICAL SPINE WITHOUT CONTRAST TECHNIQUE: Multidetector CT imaging of the head and cervical spine was performed following the standard protocol without intravenous contrast. Multiplanar CT image reconstructions of the cervical spine were also generated. COMPARISON:  06/23/2008 FINDINGS: CT HEAD FINDINGS Brain: Ventricles and cisterns are within normal. There is mild age related atrophic change. There is chronic ischemic microvascular disease. No mass, mass effect, shift of midline structures or acute hemorrhage. No evidence of acute infarction. Vascular: No hyperdense vessel or unexpected calcification. Skull: Normal. Negative for fracture or focal lesion. Sinuses/Orbits: No acute finding. Other: None. CT CERVICAL SPINE FINDINGS Alignment: No evidence of posttraumatic subluxation. Patient is kyphotic. Skull base and vertebrae: Mild spondylosis throughout the cervical spine. Atlantoaxial articulation is unremarkable. There is moderate facet arthropathy. No acute fracture or subluxation. Soft tissues and spinal canal: No prevertebral fluid or swelling. No visible canal hematoma. Disc levels:  Minimal disc space narrowing at the C5-6 level. Upper chest: No acute findings. Other: None. IMPRESSION: 1.  No acute brain injury. 2.  Age related atrophy and chronic ischemic microvascular disease. 3.  No acute cervical spine injury. 4. Mild spondylosis of the cervical spine with minimal disc disease at the C5-6 level. Electronically Signed   By: Marin Olp  M.D.   On: 03/11/2020 15:03   CT KNEE RIGHT WO CONTRAST  Result Date: 03/11/2020 CLINICAL DATA:  Evaluate distal femur fracture EXAM: CT OF THE RIGHT KNEE WITHOUT CONTRAST TECHNIQUE: Multidetector CT imaging of the right knee was performed according to the standard protocol. Multiplanar CT image reconstructions were also generated. COMPARISON:  X-ray 03/11/2020 FINDINGS: Bones/Joint/Cartilage Redemonstration of an acute comminuted fracture of the distal femoral metaphysis with approximately 1 shaft width of posterior displacement. Fracture is also slightly medially displaced. The proximal fracture component  abuts the superior pole of the patella (series 20, image 38). No evidence of patellar fracture. Tricompartmental osteoarthritis of the right knee is most pronounced within the lateral tibiofemoral compartment with moderate-severe joint space narrowing, subchondral sclerosis, subchondral cystic changes and prominent marginal osteophytosis. The proximal tibia and fibula appear intact without fracture. There is a large lipohemarthrosis. Ligaments Suboptimally assessed by CT. Muscles and Tendons No definite acute tendinous injury. The quadriceps and patellar tendons appear grossly intact. Grossly preserved muscle bulk. Soft tissues There is soft tissue hemorrhage adjacent to the fracture site. No soft tissue air to suggest open fracture. IMPRESSION: 1. Acute comminuted fracture of the distal femoral metaphysis with approximately 1 shaft width of posterior displacement. The proximal fracture component abuts the superior pole of the patella without evidence of patellar fracture hila are. 2. Large lipohemarthrosis. 3. Tricompartmental osteoarthritis of the right knee is most pronounced within the lateral tibiofemoral compartment. Electronically Signed   By: Davina Poke D.O.   On: 03/11/2020 15:15   DG Chest Port 1 View  Result Date: 03/11/2020 CLINICAL DATA:  Fall. EXAM: PORTABLE CHEST 1 VIEW COMPARISON:   May 08, 2019. FINDINGS: Stable cardiomegaly. Atherosclerosis of thoracic aorta is noted. No pneumothorax or pleural effusion is noted. Lungs are clear. Large hiatal hernia is noted. Bony thorax is unremarkable. IMPRESSION: Aortic atherosclerosis. Large hiatal hernia. No acute cardiopulmonary abnormality seen. Aortic Atherosclerosis (ICD10-I70.0). Electronically Signed   By: Marijo Conception M.D.   On: 03/11/2020 12:39    Procedures Procedures (including critical care time)  Medications Ordered in ED Medications - No data to display  ED Course  I have reviewed the triage vital signs and the nursing notes.  Pertinent labs & imaging results that were available during my care of the patient were reviewed by me and considered in my medical decision making (see chart for details).    MDM Rules/Calculators/A&P                     Patient here for evaluation following a mechanical fall. She has a deformity to the left knee but good distal pulses. Imaging is significant for distal left femur fracture. Discussed with Burman Freestone with orthopedics, who will see the patient in the emergency department. She was placed in any immobilizer for stabilization and comfort. Imaging is negative for additional significant abnormality. Medicine consulted for admission for further treatment. Patient updated findings of studies recommendation for admission and she is in agreement treatment plan.  Final Clinical Impression(s) / ED Diagnoses Final diagnoses:  Closed bicondylar fracture of right femur, initial encounter ([facility])  Fall, initial encounter    Rx / DC Orders ED Discharge Orders    None       Quintella Reichert, MD 03/11/20 1646

## 2020-03-11 NOTE — Progress Notes (Signed)
Received Pt at 18:45, on a stretcher, transferred to bed, skin intact on sacral area, sacral foam dressing applied, CHG wipes done, vital signs taken and recorded, Pt complained of moderate pain during transfer and mild pain 3/10 after repositioning, knee immobilizer in place. Pt able to wiggle toes, +1 DP to RLE, skin warm to touch. House tray ordered, Pt now resting comfortably in bed, endorsed accordingly to oncoming RN.

## 2020-03-12 ENCOUNTER — Inpatient Hospital Stay (HOSPITAL_COMMUNITY): Payer: PPO | Admitting: Anesthesiology

## 2020-03-12 ENCOUNTER — Other Ambulatory Visit (HOSPITAL_COMMUNITY): Payer: PPO

## 2020-03-12 ENCOUNTER — Encounter (HOSPITAL_COMMUNITY)
Admission: EM | Disposition: A | Discharge status: Skilled Nursing Facility | Payer: Self-pay | Source: Home / Self Care | Attending: Internal Medicine

## 2020-03-12 ENCOUNTER — Inpatient Hospital Stay (HOSPITAL_COMMUNITY): Payer: PPO

## 2020-03-12 ENCOUNTER — Encounter (HOSPITAL_COMMUNITY): Payer: Self-pay | Admitting: Internal Medicine

## 2020-03-12 ENCOUNTER — Other Ambulatory Visit: Payer: Self-pay

## 2020-03-12 DIAGNOSIS — S7291XA Unspecified fracture of right femur, initial encounter for closed fracture: Secondary | ICD-10-CM

## 2020-03-12 DIAGNOSIS — W19XXXA Unspecified fall, initial encounter: Secondary | ICD-10-CM

## 2020-03-12 DIAGNOSIS — D649 Anemia, unspecified: Secondary | ICD-10-CM | POA: Diagnosis present

## 2020-03-12 DIAGNOSIS — N289 Disorder of kidney and ureter, unspecified: Secondary | ICD-10-CM

## 2020-03-12 HISTORY — PX: ORIF FEMUR FRACTURE: SHX2119

## 2020-03-12 LAB — CBC
HCT: 23.4 % — ABNORMAL LOW (ref 36.0–46.0)
Hemoglobin: 7.5 g/dL — ABNORMAL LOW (ref 12.0–15.0)
MCH: 31.5 pg (ref 26.0–34.0)
MCHC: 32.1 g/dL (ref 30.0–36.0)
MCV: 98.3 fL (ref 80.0–100.0)
Platelets: 179 10*3/uL (ref 150–400)
RBC: 2.38 MIL/uL — ABNORMAL LOW (ref 3.87–5.11)
RDW: 14.1 % (ref 11.5–15.5)
WBC: 6.2 10*3/uL (ref 4.0–10.5)
nRBC: 0 % (ref 0.0–0.2)

## 2020-03-12 LAB — PREPARE RBC (CROSSMATCH)

## 2020-03-12 LAB — BASIC METABOLIC PANEL
Anion gap: 11 (ref 5–15)
BUN: 20 mg/dL (ref 8–23)
CO2: 21 mmol/L — ABNORMAL LOW (ref 22–32)
Calcium: 7.8 mg/dL — ABNORMAL LOW (ref 8.9–10.3)
Chloride: 106 mmol/L (ref 98–111)
Creatinine, Ser: 1.29 mg/dL — ABNORMAL HIGH (ref 0.44–1.00)
GFR calc Af Amer: 44 mL/min — ABNORMAL LOW (ref >60)
GFR calc non Af Amer: 38 mL/min — ABNORMAL LOW (ref >60)
Glucose, Bld: 122 mg/dL — ABNORMAL HIGH (ref 70–99)
Potassium: 3.9 mmol/L (ref 3.5–5.1)
Sodium: 138 mmol/L (ref 135–145)

## 2020-03-12 LAB — HEMOGLOBIN AND HEMATOCRIT, BLOOD
HCT: 29 % — ABNORMAL LOW (ref 36.0–46.0)
Hemoglobin: 9.3 g/dL — ABNORMAL LOW (ref 12.0–15.0)

## 2020-03-12 LAB — SURGICAL PCR SCREEN
MRSA, PCR: NEGATIVE
Staphylococcus aureus: NEGATIVE

## 2020-03-12 SURGERY — OPEN REDUCTION INTERNAL FIXATION (ORIF) DISTAL FEMUR FRACTURE
Anesthesia: Monitor Anesthesia Care | Site: Leg Upper | Laterality: Right

## 2020-03-12 MED ORDER — SODIUM CHLORIDE 0.9 % IV SOLN: INTRAVENOUS | Status: DC | PRN

## 2020-03-12 MED ORDER — PROPOFOL 500 MG/50ML IV EMUL
INTRAVENOUS | Status: DC | PRN
  Administered 2020-03-12: 15 ug/kg/min via INTRAVENOUS

## 2020-03-12 MED ORDER — MORPHINE SULFATE (PF) 2 MG/ML IV SOLN: 0.5000 mg | INTRAVENOUS | Status: DC | PRN

## 2020-03-12 MED ORDER — HYDROCODONE-ACETAMINOPHEN 7.5-325 MG PO TABS: 1.0000 | ORAL_TABLET | Freq: Four times a day (QID) | ORAL | Status: DC | PRN

## 2020-03-12 MED ORDER — CEFAZOLIN SODIUM-DEXTROSE 2-4 GM/100ML-% IV SOLN
2.0000 g | INTRAVENOUS | Status: AC
  Administered 2020-03-12: 2 g via INTRAVENOUS
  Filled 2020-03-12: qty 100

## 2020-03-12 MED ORDER — PNEUMOCOCCAL VAC POLYVALENT 25 MCG/0.5ML IJ INJ
0.5000 mL | INJECTION | INTRAMUSCULAR | Status: AC
  Administered 2020-03-15: 0.5 mL via INTRAMUSCULAR
  Filled 2020-03-12: qty 0.5

## 2020-03-12 MED ORDER — LACTATED RINGERS IV SOLN: INTRAVENOUS | Status: DC

## 2020-03-12 MED ORDER — 0.9 % SODIUM CHLORIDE (POUR BTL) OPTIME
TOPICAL | Status: DC | PRN
  Administered 2020-03-12: 1000 mL

## 2020-03-12 MED ORDER — FENTANYL CITRATE (PF) 250 MCG/5ML IJ SOLN
INTRAMUSCULAR | Status: AC
  Filled 2020-03-12: qty 5

## 2020-03-12 MED ORDER — LACTATED RINGERS IV SOLN: INTRAVENOUS | Status: DC | PRN

## 2020-03-12 MED ORDER — ENSURE ENLIVE PO LIQD
237.0000 mL | Freq: Every day | ORAL | Status: DC
  Administered 2020-03-13 – 2020-03-14 (×2): 237 mL via ORAL

## 2020-03-12 MED ORDER — POVIDONE-IODINE 10 % EX SWAB: 2.0000 "application " | Freq: Once | CUTANEOUS | Status: DC

## 2020-03-12 MED ORDER — FENTANYL CITRATE (PF) 100 MCG/2ML IJ SOLN
INTRAMUSCULAR | Status: AC
  Administered 2020-03-12: 50 ug via INTRAVENOUS
  Filled 2020-03-12: qty 2

## 2020-03-12 MED ORDER — ORAL CARE MOUTH RINSE: 15.0000 mL | Freq: Once | OROMUCOSAL | Status: AC

## 2020-03-12 MED ORDER — VANCOMYCIN HCL 1000 MG IV SOLR
INTRAVENOUS | Status: DC | PRN
  Administered 2020-03-12: 1000 mg

## 2020-03-12 MED ORDER — VANCOMYCIN HCL 1000 MG IV SOLR
INTRAVENOUS | Status: AC
  Filled 2020-03-12: qty 1000

## 2020-03-12 MED ORDER — SODIUM CHLORIDE 0.9 % IV SOLN: 10.0000 mL/h | Freq: Once | INTRAVENOUS | Status: DC

## 2020-03-12 MED ORDER — DEXAMETHASONE SODIUM PHOSPHATE 10 MG/ML IJ SOLN
INTRAMUSCULAR | Status: DC | PRN
  Administered 2020-03-12: 10 mg

## 2020-03-12 MED ORDER — ACETAMINOPHEN 500 MG PO TABS
1000.0000 mg | ORAL_TABLET | Freq: Once | ORAL | Status: AC
  Administered 2020-03-12: 1000 mg via ORAL
  Filled 2020-03-12: qty 2

## 2020-03-12 MED ORDER — PHENYLEPHRINE HCL (PRESSORS) 10 MG/ML IV SOLN
INTRAVENOUS | Status: DC | PRN
  Administered 2020-03-12 (×2): 80 ug via INTRAVENOUS

## 2020-03-12 MED ORDER — CHLORHEXIDINE GLUCONATE 0.12 % MT SOLN
OROMUCOSAL | Status: AC
  Administered 2020-03-12: 15 mL via OROMUCOSAL
  Filled 2020-03-12: qty 15

## 2020-03-12 MED ORDER — ONDANSETRON HCL 4 MG/2ML IJ SOLN: 4.0000 mg | Freq: Once | INTRAMUSCULAR | Status: DC | PRN

## 2020-03-12 MED ORDER — CEFAZOLIN SODIUM-DEXTROSE 1-4 GM/50ML-% IV SOLN
1.0000 g | Freq: Two times a day (BID) | INTRAVENOUS | Status: AC
  Administered 2020-03-12 – 2020-03-13 (×2): 1 g via INTRAVENOUS
  Filled 2020-03-12 (×2): qty 50

## 2020-03-12 MED ORDER — PROPOFOL 10 MG/ML IV BOLUS
INTRAVENOUS | Status: AC
  Filled 2020-03-12: qty 20

## 2020-03-12 MED ORDER — ENOXAPARIN SODIUM 30 MG/0.3ML ~~LOC~~ SOLN
30.0000 mg | SUBCUTANEOUS | Status: DC
  Administered 2020-03-13 – 2020-03-15 (×3): 30 mg via SUBCUTANEOUS
  Filled 2020-03-12 (×3): qty 0.3

## 2020-03-12 MED ORDER — FENTANYL CITRATE (PF) 100 MCG/2ML IJ SOLN: 25.0000 ug | INTRAMUSCULAR | Status: DC | PRN

## 2020-03-12 MED ORDER — CHLORHEXIDINE GLUCONATE 0.12 % MT SOLN: 15.0000 mL | Freq: Once | OROMUCOSAL | Status: AC

## 2020-03-12 MED ORDER — MIDAZOLAM HCL 2 MG/2ML IJ SOLN
INTRAMUSCULAR | Status: AC
  Filled 2020-03-12: qty 2

## 2020-03-12 MED ORDER — ROPIVACAINE HCL 5 MG/ML IJ SOLN
INTRAMUSCULAR | Status: DC | PRN
  Administered 2020-03-12: 30 mL via PERINEURAL

## 2020-03-12 MED ORDER — FENTANYL CITRATE (PF) 100 MCG/2ML IJ SOLN: 50.0000 ug | Freq: Once | INTRAMUSCULAR | Status: AC

## 2020-03-12 MED ORDER — ADULT MULTIVITAMIN W/MINERALS CH
1.0000 | ORAL_TABLET | Freq: Every day | ORAL | Status: DC
  Administered 2020-03-13 – 2020-03-15 (×3): 1 via ORAL
  Filled 2020-03-12 (×3): qty 1

## 2020-03-12 MED ORDER — BUPIVACAINE IN DEXTROSE 0.75-8.25 % IT SOLN
INTRATHECAL | Status: DC | PRN
  Administered 2020-03-12: 2 mL via INTRATHECAL

## 2020-03-12 MED ORDER — PHENYLEPHRINE HCL-NACL 10-0.9 MG/250ML-% IV SOLN
INTRAVENOUS | Status: DC | PRN
Start: 2020-03-12 — End: 2020-03-12
  Administered 2020-03-12: 100 ug/min via INTRAVENOUS

## 2020-03-12 SURGICAL SUPPLY — 76 items
BIT DRILL 4.3 (BIT) ×2
BIT DRILL 4.3MM (BIT) ×1
BIT DRILL 4.3X300MM (BIT) ×1 IMPLANT
BIT DRILL LONG 3.3 (BIT) ×2 IMPLANT
BIT DRILL LONG 3.3MM (BIT) ×1
BIT DRILL QC 3.3X195 (BIT) ×3 IMPLANT
BLADE CLIPPER SURG (BLADE) IMPLANT
BNDG COHESIVE 6X5 TAN STRL LF (GAUZE/BANDAGES/DRESSINGS) ×3 IMPLANT
BNDG ELASTIC 6X10 VLCR STRL LF (GAUZE/BANDAGES/DRESSINGS) ×3 IMPLANT
BRUSH SCRUB EZ PLAIN DRY (MISCELLANEOUS) ×6 IMPLANT
CANISTER SUCT 3000ML PPV (MISCELLANEOUS) ×3 IMPLANT
CAP LOCK NCB (Cap) ×24 IMPLANT
CHLORAPREP W/TINT 26 (MISCELLANEOUS) ×3 IMPLANT
COVER SURGICAL LIGHT HANDLE (MISCELLANEOUS) ×3 IMPLANT
COVER WAND RF STERILE (DRAPES) ×3 IMPLANT
DRAPE C-ARM 42X72 X-RAY (DRAPES) ×3 IMPLANT
DRAPE C-ARMOR (DRAPES) ×3 IMPLANT
DRAPE HALF SHEET 40X57 (DRAPES) ×6 IMPLANT
DRAPE ORTHO SPLIT 77X108 STRL (DRAPES) ×6
DRAPE SURG 17X23 STRL (DRAPES) ×3 IMPLANT
DRAPE SURG ORHT 6 SPLT 77X108 (DRAPES) ×2 IMPLANT
DRAPE U-SHAPE 47X51 STRL (DRAPES) ×3 IMPLANT
DRSG ADAPTIC 3X8 NADH LF (GAUZE/BANDAGES/DRESSINGS) IMPLANT
DRSG MEPILEX BORDER 4X12 (GAUZE/BANDAGES/DRESSINGS) IMPLANT
DRSG MEPILEX BORDER 4X4 (GAUZE/BANDAGES/DRESSINGS) IMPLANT
DRSG MEPILEX BORDER 4X8 (GAUZE/BANDAGES/DRESSINGS) ×6 IMPLANT
DRSG PAD ABDOMINAL 8X10 ST (GAUZE/BANDAGES/DRESSINGS) ×9 IMPLANT
ELECT REM PT RETURN 9FT ADLT (ELECTROSURGICAL) ×3
ELECTRODE REM PT RTRN 9FT ADLT (ELECTROSURGICAL) ×1 IMPLANT
GAUZE SPONGE 4X4 12PLY STRL (GAUZE/BANDAGES/DRESSINGS) ×3 IMPLANT
GAUZE SPONGE 4X4 12PLY STRL LF (GAUZE/BANDAGES/DRESSINGS) ×3 IMPLANT
GLOVE BIO SURGEON STRL SZ 6.5 (GLOVE) ×6 IMPLANT
GLOVE BIO SURGEON STRL SZ7.5 (GLOVE) ×12 IMPLANT
GLOVE BIO SURGEONS STRL SZ 6.5 (GLOVE) ×3
GLOVE BIOGEL PI IND STRL 6.5 (GLOVE) ×1 IMPLANT
GLOVE BIOGEL PI IND STRL 7.5 (GLOVE) ×1 IMPLANT
GLOVE BIOGEL PI INDICATOR 6.5 (GLOVE) ×2
GLOVE BIOGEL PI INDICATOR 7.5 (GLOVE) ×2
GOWN STRL REUS W/ TWL LRG LVL3 (GOWN DISPOSABLE) ×3 IMPLANT
GOWN STRL REUS W/TWL LRG LVL3 (GOWN DISPOSABLE) ×9
K-WIRE 2.0 (WIRE) ×3
K-WIRE FXSTD 280X2XNS SS (WIRE) ×1
KIT BASIN OR (CUSTOM PROCEDURE TRAY) ×3 IMPLANT
KIT TURNOVER KIT B (KITS) ×3 IMPLANT
KWIRE FXSTD 280X2XNS SS (WIRE) ×1 IMPLANT
NS IRRIG 1000ML POUR BTL (IV SOLUTION) ×3 IMPLANT
PACK TOTAL JOINT (CUSTOM PROCEDURE TRAY) ×3 IMPLANT
PAD ABD 8X10 STRL (GAUZE/BANDAGES/DRESSINGS) ×3 IMPLANT
PAD ARMBOARD 7.5X6 YLW CONV (MISCELLANEOUS) ×6 IMPLANT
PAD CAST 4YDX4 CTTN HI CHSV (CAST SUPPLIES) ×1 IMPLANT
PADDING CAST COTTON 4X4 STRL (CAST SUPPLIES) ×3
PADDING CAST COTTON 6X4 STRL (CAST SUPPLIES) ×3 IMPLANT
PLATE FEM DIST NCB PP 278MM (Plate) ×3 IMPLANT
SCREW 5.0 32MM (Screw) ×3 IMPLANT
SCREW 5.0 70MM (Screw) ×9 IMPLANT
SCREW NCB 3.5X75X5X6.2XST (Screw) ×2 IMPLANT
SCREW NCB 4.0 32MM (Screw) ×3 IMPLANT
SCREW NCB 5.0X34MM (Screw) ×3 IMPLANT
SCREW NCB 5.0X75MM (Screw) ×6 IMPLANT
SPONGE LAP 18X18 RF (DISPOSABLE) IMPLANT
STAPLER VISISTAT 35W (STAPLE) ×3 IMPLANT
SUCTION FRAZIER HANDLE 10FR (MISCELLANEOUS) ×3
SUCTION TUBE FRAZIER 10FR DISP (MISCELLANEOUS) ×1 IMPLANT
SUT ETHILON 3 0 PS 1 (SUTURE) IMPLANT
SUT MNCRL+ AB 3-0 CT1 36 (SUTURE) ×1 IMPLANT
SUT MONOCRYL AB 3-0 CT1 36IN (SUTURE) ×3
SUT VIC AB 0 CT1 27 (SUTURE) ×6
SUT VIC AB 0 CT1 27XBRD ANBCTR (SUTURE) ×2 IMPLANT
SUT VIC AB 1 CT1 27 (SUTURE)
SUT VIC AB 1 CT1 27XBRD ANBCTR (SUTURE) IMPLANT
SUT VIC AB 2-0 CT1 (SUTURE) ×6 IMPLANT
SUT VIC AB 2-0 CT1 27 (SUTURE) ×6
SUT VIC AB 2-0 CT1 TAPERPNT 27 (SUTURE) ×2 IMPLANT
TOWEL GREEN STERILE (TOWEL DISPOSABLE) ×6 IMPLANT
TRAY FOLEY MTR SLVR 16FR STAT (SET/KITS/TRAYS/PACK) IMPLANT
WATER STERILE IRR 1000ML POUR (IV SOLUTION) ×6 IMPLANT

## 2020-03-12 NOTE — Op Note (Signed)
Orthopaedic Surgery Operative Note (CSN: [id number] ) Date of Surgery:03/12/2020  Admit Date: 03/11/2020   Diagnoses: Pre-Op Diagnoses: Right supracondylar distal femur fracture   Post-Op Diagnosis: Same  Procedures: CPT 27511-Open reduction internal fixation of right distal femur fracture  Surgeons : Primary: Shona Needles, MD  Assistant: Patrecia Pace, PA-C  Location: OR 3   Anesthesia:Spinal anesthesia  Antibiotics: Ancef 2g preop with 1 gm vancomycin powder placed topically   Tourniquet time:None used  Estimated Blood Loss: 185UD  Complications:None   Specimens:None   Implants: Implant Name Type Inv. Item Serial No. Manufacturer Lot No. LRB No. Used Action  PLATE FEM DIST NCB PP 278MM - [id number] Plate PLATE FEM DIST NCB PP 278MM  [employer](ORTH,TRAU,BIO,SG)  Right 1 Implanted  SCREW 5.0 70MM - [id number] Screw SCREW 5.0 70MM  [employer](ORTH,TRAU,BIO,SG)  Right 3 Implanted  SCREW NCB 5.0X34MM - [id number] Screw SCREW NCB 5.0X34MM  [employer](ORTH,TRAU,BIO,SG)  Right 1 Implanted  SCREW 5.0 32MM - [id number] Screw SCREW 5.0 32MM  [employer](ORTH,TRAU,BIO,SG)  Right 1 Implanted  SCREW NCB 5.0X75MM - [id number] Screw SCREW NCB 5.0X75MM  [employer](ORTH,TRAU,BIO,SG)  Right 2 Implanted  SCREW NCB 4.0 32MM - [id number] Screw SCREW NCB 4.0 32MM  [employer](ORTH,TRAU,BIO,SG)  Right 1 Implanted  CAP LOCK NCB - [id number] Cap CAP LOCK NCB  [employer](ORTH,TRAU,BIO,SG)  Right 8 Implanted     Indications for Surgery: 86 year old female who sustained a ground-level fall had a right displaced supracondylar femur fracture. Due to the unstable nature of her injury I recommend proceeding with open reduction internal fixation.  Risks and benefits were discussed with the patient.  Risks included but not limited to bleeding, infection, malunion, nonunion, hardware failure, hardware irritation, nerve and blood vessel injury, stiffness of the knee, posttraumatic arthritis, DVT, even  the possibility anesthetic complications.  She agreed to proceed with surgery and consent was obtained.  Operative Findings: Open reduction internal fixation of right distal femur fracture using [employer] NCB periprosthetic distal femoral locking plate  Procedure: The patient was identified in the preoperative holding area. Consent was confirmed with the patient and their family and all questions were answered. The operative extremity was marked after confirmation with the patient. she was then brought back to the operating room by our anesthesia colleagues.  She was placed under spinal anesthesia and carefully transferred over to a radiolucent flat top table.  She was placed under some conscious sedation.  The right lower extremity had a bump placed under it and was prepped and draped in usual sterile fashion.  A timeout was performed to verify the patient, the procedure, and the extremity.  Preoperative antibiotics were dosed.  Fluoroscopic imaging was obtained to show the unstable nature of the fracture. The knee and hip were flexed over a triangle.  Reduction maneuver was performed.  There is still some residual anterior translation of the femoral shaft.  A incision was carried bilaterally along the distal femur through skin and subcutaneous tissue.  The IT band was split in line with the incision.  I then exposed the cortex of the distal femur.  I was able to use a Cobb elevator and manipulation of the femoral shaft to obtain improved alignment in the sagittal plane.  Once I had a reduction performed I then developed an interval between the vastus lateralis and the lateral femoral shaft.  I then slid a 12 hole [employer] NCB distal femoral locking plate along the lateral cortex and held provisionally with a K wire  in the distal articular block.  A percutaneous incision was made at the proximal hole of the plate and a 3.3 mm drill bit was used to align the proximal portion of the plate.  I then  returned to the distal segment and placed 5.0 millimeter screws to bring the plate flush to bone.  I then placed a percutaneous 5.0 millimeter screw in the femoral shaft to correct the coronal alignment.  I confirmed that adequate reduction was obtained on AP and lateral views.  I then placed another 5.0 millimeter screw in the femoral shaft.  The previous 3.3 mm drill bit was removed and 4.0 millimeter screw was placed.  Locking caps were placed on the 5.0 millimeter screws in the femoral shaft.  I then removed the targeting arm and I returned to the distal segment and placed on the 5.0 millimeter screws.  A total of 5 screws were placed in the distal segment.  Locking caps were placed on all the distal screws.  Final fluoroscopic imaging was obtained.  The incision was copiously irrigated.  A gram of vancomycin powder was placed into the incision.  The IT band was closed with 0 Vicryl suture.  The skin was closed with 2-0 Vicryl and 3-0 Monocryl and sealed with Dermabond.  Sterile dressing consisting of Mepilex, sterile cast padding and Ace wrap was placed.  Patient was awoken from anesthesia and taken the PACU in stable condition.  Post Op Plan/Instructions: The patient will be weightbearing as tolerated to the right lower extremity.  She will receive postoperative Ancef.  She will be started on Lovenox for DVT prophylaxis.  She will be mobilized with physical therapy and she will likely need a skilled nursing facility post discharge.  I was present and performed the entire surgery.  Patrecia Pace, PA-C did assist me throughout the case. An assistant was necessary given the difficulty in approach, maintenance of reduction and ability to instrument the fracture.   Katha Hamming, MD [facility]

## 2020-03-12 NOTE — Anesthesia Procedure Notes (Signed)
Anesthesia Regional Block: Femoral nerve block   Pre-Anesthetic Checklist: ,, timeout performed, Correct Patient, Correct Site, Correct Laterality, Correct Procedure, Correct Position, site marked, Risks and benefits discussed,  Surgical consent,  Pre-op evaluation,  At surgeon's request and post-op pain management  Laterality: Right  Prep: Maximum Sterile Barrier Precautions used, chloraprep       Needles:  Injection technique: Single-shot  Needle Type: Echogenic Stimulator Needle     Needle Length: 9cm  Needle Gauge: 22     Additional Needles:   Procedures:,,,, ultrasound used (permanent image in chart),,,,  Narrative:  Start time: 03/12/2020 8:20 AM End time: 03/12/2020 8:25 AM Injection made incrementally with aspirations every 5 mL.  Performed by: Personally  Anesthesiologist: Pervis Hocking, DO  Additional Notes: Monitors applied. No increased pain on injection. No increased resistance to injection. Injection made in 5cc increments. Good needle visualization. Patient tolerated procedure well.

## 2020-03-12 NOTE — Anesthesia Preprocedure Evaluation (Signed)
Anesthesia Evaluation  Patient identified by MRN, date of birth, ID band Patient awake    Reviewed: Allergy & Precautions, NPO status , Patient's Chart, lab work & pertinent test results  History of Anesthesia Complications (+) AWARENESS UNDER ANESTHESIA  Airway Mallampati: II  TM Distance: >3 FB Neck ROM: Full    Dental no notable dental hx.    Pulmonary neg pulmonary ROS, former smoker,    Pulmonary exam normal breath sounds clear to auscultation       Cardiovascular hypertension, Pt. on medications Normal cardiovascular exam Rhythm:Regular Rate:Normal     Neuro/Psych negative neurological ROS  negative psych ROS   GI/Hepatic Neg liver ROS, GERD  Medicated and Controlled,  Endo/Other  Hypothyroidism   Renal/GU Renal diseaseCr 1.29  negative genitourinary   Musculoskeletal Right distal femur fx   Abdominal   Peds negative pediatric ROS (+)  Hematology  (+) Blood dyscrasia, anemia , hct 23.4   Anesthesia Other Findings   Reproductive/Obstetrics negative OB ROS                           Anesthesia Physical Anesthesia Plan  ASA: III  Anesthesia Plan: MAC, Regional and Spinal   Post-op Pain Management:  Regional for Post-op pain   Induction: Intravenous  PONV Risk Score and Plan: 3 and Treatment may vary due to age or medical condition, TIVA and Propofol infusion  Airway Management Planned: Natural Airway and Simple Face Mask  Additional Equipment: None  Intra-op Plan:   Post-operative Plan:   Informed Consent: I have reviewed the patients History and Physical, chart, labs and discussed the procedure including the risks, benefits and alternatives for the proposed anesthesia with the patient or authorized representative who has indicated his/her understanding and acceptance.       Plan Discussed with: CRNA  Anesthesia Plan Comments:        Anesthesia Quick  Evaluation

## 2020-03-12 NOTE — Discharge Instructions (Signed)
[facility] Discharge Instructions   General Discharge Instructions  WEIGHT BEARING STATUS: weightbearing as tolerated right lower extremity  RANGE OF MOTION/ACTIVITY: Okay for unrestricted motion of right knee  Wound Care:  Incisions can be left open to air if there is no drainage. If incision continues to have drainage, follow wound care instructions below. Okay to shower if no drainage from incisions.  DVT/PE prophylaxis: Lovenox  Diet: as you were eating previously.  Can use over the counter stool softeners and bowel preparations, such as Miralax, to help with bowel movements.  Narcotics can be constipating.  Be sure to drink plenty of fluids  PAIN MEDICATION USE AND EXPECTATIONS  You have likely been given narcotic medications to help control your pain.  After a traumatic event that results in an fracture (broken bone) with or without surgery, it is ok to use narcotic pain medications to help control one's pain.  We understand that everyone responds to pain differently and each individual patient will be evaluated on a regular basis for the continued need for narcotic medications. Ideally, narcotic medication use should last no more than 6-8 weeks (coinciding with fracture healing).   As a patient it is your responsibility as well to monitor narcotic medication use and report the amount and frequency you use these medications when you come to your office visit.   We would also advise that if you are using narcotic medications, you should take a dose prior to therapy to maximize you participation.  IF YOU ARE ON NARCOTIC MEDICATIONS IT IS NOT PERMISSIBLE TO OPERATE A MOTOR VEHICLE (MOTORCYCLE/CAR/TRUCK/MOPED) OR HEAVY MACHINERY DO NOT MIX NARCOTICS WITH OTHER CNS (CENTRAL NERVOUS SYSTEM) DEPRESSANTS SUCH AS ALCOHOL   STOP SMOKING OR USING NICOTINE PRODUCTS!!!!  As discussed nicotine severely impairs your body's ability to heal surgical and traumatic wounds but also  impairs bone healing.  Wounds and bone heal by forming microscopic blood vessels (angiogenesis) and nicotine is a vasoconstrictor (essentially, shrinks blood vessels).  Therefore, if vasoconstriction occurs to these microscopic blood vessels they essentially disappear and are unable to deliver necessary nutrients to the healing tissue.  This is one modifiable factor that you can do to dramatically increase your chances of healing your injury.    (This means no smoking, no nicotine gum, patches, etc)  DO NOT USE NONSTEROIDAL ANTI-INFLAMMATORY DRUGS (NSAID'S)  Using products such as Advil (ibuprofen), Aleve (naproxen), Motrin (ibuprofen) for additional pain control during fracture healing can delay and/or prevent the healing response.  If you would like to take over the counter (OTC) medication, Tylenol (acetaminophen) is ok.  However, some narcotic medications that are given for pain control contain acetaminophen as well. Therefore, you should not exceed more than 4000 mg of tylenol in a day if you do not have liver disease.  Also note that there are may OTC medicines, such as cold medicines and allergy medicines that my contain tylenol as well.  If you have any questions about medications and/or interactions please ask your doctor/PA or your pharmacist.      ICE AND ELEVATE INJURED/OPERATIVE EXTREMITY  Using ice and elevating the injured extremity above your heart can help with swelling and pain control.  Icing in a pulsatile fashion, such as 20 minutes on and 20 minutes off, can be followed.    Do not place ice directly on skin. Make sure there is a barrier between to skin and the ice pack.    Using frozen items such as frozen peas works  well as the conform nicely to the are that needs to be iced.  USE AN ACE WRAP OR TED HOSE FOR SWELLING CONTROL  In addition to icing and elevation, Ace wraps or TED hose are used to help limit and resolve swelling.  It is recommended to use Ace wraps or TED hose until  you are informed to stop.    When using Ace Wraps start the wrapping distally (farthest away from the body) and wrap proximally (closer to the body)   Example: If you had surgery on your leg or thing and you do not have a splint on, start the ace wrap at the toes and work your way up to the thigh        If you had surgery on your upper extremity and do not have a splint on, start the ace wrap at your fingers and work your way up to the upper arm    [place]: [phone]   VISIT OUR WEBSITE FOR ADDITIONAL INFORMATION: [email or web address]    Discharge Wound Care Instructions  Do NOT apply any ointments, solutions or lotions to pin sites or surgical wounds.  These prevent needed drainage and even though solutions like hydrogen peroxide kill bacteria, they also damage cells lining the pin sites that help fight infection.  Applying lotions or ointments can keep the wounds moist and can cause them to breakdown and open up as well. This can increase the risk for infection. When in doubt call the office.  Surgical incisions should be dressed daily.  If any drainage is noted, use one layer of adaptic, then gauze, Kerlix, and an ace wrap.  Once the incision is completely dry and without drainage, it may be left open to air out.  Showering may begin 36-48 hours later.  Cleaning gently with soap and water.  Traumatic wounds should be dressed daily as well.    One layer of adaptic, gauze, Kerlix, then ace wrap.  The adaptic can be discontinued once the draining has ceased    If you have a wet to dry dressing: wet the gauze with saline the squeeze as much saline out so the gauze is moist (not soaking wet), place moistened gauze over wound, then place a dry gauze over the moist one, followed by Kerlix wrap, then ace wrap.

## 2020-03-12 NOTE — Plan of Care (Signed)
?  Problem: Education: ?Goal: Knowledge of General Education information will improve ?Description: Including pain rating scale, medication(s)/side effects and non-pharmacologic comfort measures ?Outcome: Progressing ?  ?Problem: Health Behavior/Discharge Planning: ?Goal: Ability to manage health-related needs will improve ?Outcome: Progressing ?  ?Problem: Coping: ?Goal: Level of anxiety will decrease ?Outcome: Progressing ?  ?

## 2020-03-12 NOTE — Progress Notes (Signed)
Initial Nutrition Assessment  RD working remotely.  DOCUMENTATION CODES:   Obesity unspecified  INTERVENTION:   -Once diet is advance,d add:  -Ensure Enlive po daily, each supplement provides 350 kcal and 20 grams of protein -Magic cup TID with meals, each supplement provides 290 kcal and 9 grams of protein -MVI with minerals dialy  NUTRITION DIAGNOSIS:   Increased nutrient needs related to post-op healing as evidenced by estimated needs.  GOAL:   Patient will meet greater than or equal to 90% of their needs  MONITOR:   PO intake, Supplement acceptance, Diet advancement, Labs, Weight trends, I & O's  REASON FOR ASSESSMENT:   Consult Assessment of nutrition requirement/status  ASSESSMENT:   Jennifer Patel is a 86 y.o. female with medical history significant of refractory HTN on multiple BP meds, remote TIA in 1990s, hypothyroidism, gout, HLD, presented with a mechanical fall.  Patient lives by herself, and today she did not help for a walk and slipped on some pine needles while climbing uphill and landed on her right knee. She felt severe pain on the right knee and could not get up or bear weight.  She denied any prodrome such as lightheaded or numbness or weakness of any of the limbs before this happened, she further denied any chest pain or short of breath.  Pt admitted with rt femoral neck fracture secondary to mechanical fall.   Reviewed I/O's: +90 ml x 24 hours  UOP: 450 ml x 24 hours  Pt down in OR at time of visit. RD unable to speak with pt or obtain nutrition-focused physical exam at this time.   Pt NPO for ORIF today.   Reviewed meal completion records; pt was previously on a heart healthy diet, with 5% meal completion at dinner last night.   Reviewed wt hx; pt has experienced a 4.6% wt loss over the past year, which is not significant for time frame.   Medications reviewed and include lasix.  Labs reviewed: Mg: 1.0.   Diet Order:   Diet Order       Diet Heart Room service appropriate? Yes; Fluid consistency: Thin  Diet effective now              EDUCATION NEEDS:   No education needs have been identified at this time  Skin:  Skin Assessment: Reviewed RN Assessment  Last BM:  Unknown  Height:   Ht Readings from Last 1 Encounters:  03/11/20 3\' 11"  (1.194 m)    Weight:   Wt Readings from Last 1 Encounters:  03/11/20 45.4 kg    Ideal Body Weight:  35.5 kg  BMI:  Body mass index is 31.83 kg/m.  Estimated Nutritional Needs:   Kcal:  1350-1550  Protein:  55-70 grams  Fluid:  > 1.3 L    Loistine Chance, RD, LDN, [place] Registered Dietitian II Certified Diabetes Care and Education Specialist Please refer to [facility] for RD and/or RD on-call/weekend/after hours pager

## 2020-03-12 NOTE — Progress Notes (Signed)
PT Cancellation Note  Patient Details Name: Jennifer Patel MRN: [id number] DOB: [birth year]   Cancelled Treatment:    Reason Eval/Treat Not Completed: Patient at procedure or test/unavailable (planned ORIF).    Wyona Almas, PT, DPT Acute Rehabilitation Services Pager [phone] Office [phone]    Deno Etienne 03/12/2020, 7:58 AM

## 2020-03-12 NOTE — Anesthesia Procedure Notes (Signed)
Spinal  Patient location during procedure: OR Start time: 03/12/2020 9:10 AM End time: 03/12/2020 9:15 AM Staffing Performed: anesthesiologist  Anesthesiologist: Pervis Hocking, DO Preanesthetic Checklist Completed: patient identified, IV checked, risks and benefits discussed, surgical consent, monitors and equipment checked, pre-op evaluation and timeout performed Spinal Block Patient position: sitting Prep: DuraPrep and site prepped and draped Patient monitoring: cardiac monitor, continuous pulse ox and blood pressure Approach: midline Location: L3-4 Injection technique: single-shot Needle Needle type: Pencan  Needle gauge: 24 G Needle length: 9 cm Assessment Sensory level: T6 Additional Notes Functioning IV was confirmed and monitors were applied. Sterile prep and drape, including hand hygiene and sterile gloves were used. The patient was positioned and the spine was prepped. The skin was anesthetized with lidocaine.  Free flow of clear CSF was obtained prior to injecting local anesthetic into the CSF.  The spinal needle aspirated freely following injection.  The needle was carefully withdrawn.  The patient tolerated the procedure well.

## 2020-03-12 NOTE — Anesthesia Postprocedure Evaluation (Signed)
Anesthesia Post Note  Patient: Jennifer Patel  Procedure(s) Performed: OPEN REDUCTION INTERNAL FIXATION (ORIF) DISTAL FEMUR FRACTURE (Right Leg Upper)     Patient location during evaluation: PACU Anesthesia Type: Regional, MAC and Spinal Level of consciousness: awake and alert, oriented and patient cooperative Pain management: pain level controlled Vital Signs Assessment: post-procedure vital signs reviewed and stable Respiratory status: spontaneous breathing, nonlabored ventilation and respiratory function stable Cardiovascular status: blood pressure returned to baseline and stable Postop Assessment: no apparent nausea or vomiting, spinal receding, no headache and no backache Anesthetic complications: no    Last Vitals:  Vitals:   03/12/20 1100 03/12/20 1116  BP: 128/82 (!) 133/59  Pulse: 66 65  Resp: (!) 22 16  Temp: 37.3 C 36.6 C  SpO2: 94%     Last Pain:  Vitals:   03/12/20 1116  TempSrc: Oral  PainSc:                  Pervis Hocking

## 2020-03-12 NOTE — Anesthesia Procedure Notes (Signed)
Procedure Name: MAC Date/Time: 03/12/2020 9:10 AM Performed by: Neldon Newport, CRNA Pre-anesthesia Checklist: Patient identified, Emergency Drugs available, Patient being monitored, Timeout performed and Suction available Oxygen Delivery Method: Simple face mask

## 2020-03-12 NOTE — Transfer of Care (Signed)
Immediate Anesthesia Transfer of Care Note  Patient: Jennifer Patel  Procedure(s) Performed: OPEN REDUCTION INTERNAL FIXATION (ORIF) DISTAL FEMUR FRACTURE (Right Leg Upper)  Patient Location: PACU  Anesthesia Type:MAC and Spinal  Level of Consciousness: awake, alert  and oriented  Airway & Oxygen Therapy: Patient Spontanous Breathing and Patient connected to nasal cannula oxygen  Post-op Assessment: Report given to RN, Post -op Vital signs reviewed and stable and Patient moving all extremities X 4  Post vital signs: Reviewed and stable  Last Vitals:  Vitals Value Taken Time  BP    Temp    Pulse    Resp    SpO2      Last Pain:  Vitals:   03/12/20 0823  TempSrc:   PainSc: 0-No pain      Patients Stated Pain Goal: 0 (77/41/28 7867)  Complications: No apparent anesthesia complications

## 2020-03-12 NOTE — Evaluation (Signed)
Physical Therapy Evaluation Patient Details Name: Jennifer Patel MRN: [id number] DOB: January 30, 1932 Today's Date: 03/12/2020   History of Present Illness  Pt is a 86 y.o. F with significant PMH of hypertension, TIA who was admitted May 20 after a mechanical fall with right femur fracture. Underwent ORIF on 03/12/2020.   Clinical Impression  Prior to admission, pt lives alone in an apartment and is independent with mobility and ADL's. On PT evaluation, pt presents with RLE weakness, decreased ROM, pain, decreased activity tolerance, balance deficits and decreased cognition. Requiring moderate assist for bed mobility and deferred transfer. Education provided regarding appropriate activity progression and weightbearing status (RLE WBAT). Will likely need reinforcement as pt very fearful of movement and falling currently. In light of deficits and decreased caregiver support, recommending SNF at discharge.     Follow Up Recommendations SNF;Supervision/Assistance - 24 hour    Equipment Recommendations  Other (comment)(defer)    Recommendations for Other Services       Precautions / Restrictions Precautions Precautions: Fall;Other (comment) Precaution Comments: Fearful of movement Restrictions Weight Bearing Restrictions: No RLE Weight Bearing: Weight bearing as tolerated      Mobility  Bed Mobility Overal bed mobility: Needs Assistance Bed Mobility: Supine to Sit;Sit to Supine     Supine to sit: Mod assist Sit to supine: Mod assist   General bed mobility comments: Instruction for sequencing, assist for RLE management and trunk to upright; use of bed pad to bring hips to edge of bed. Pt able to bring her trunk back down to supine, requiring assist for BLE's.   Transfers                 General transfer comment: deferred by pt  Ambulation/Gait                Stairs            Wheelchair Mobility    Modified Rankin (Stroke Patients Only)       Balance  Overall balance assessment: Needs assistance Sitting-balance support: Feet unsupported Sitting balance-Leahy Scale: Fair Sitting balance - Comments: close supervision                                     Pertinent Vitals/Pain Pain Assessment: Faces Faces Pain Scale: Hurts whole lot Pain Location: RLE with movement Pain Descriptors / Indicators: Grimacing;Operative site guarding Pain Intervention(s): Limited activity within patient's tolerance;Monitored during session;Repositioned    Home Living Family/patient expects to be discharged to:: Private residence Living Arrangements: Alone   Type of Home: Apartment Home Access: Level entry     Home Layout: Able to live on main level with bedroom/bathroom Home Equipment: Cane - single point;Walker - 2 wheels      Prior Function Level of Independence: Independent         Comments: Reports daughter online orders her groceries for delivery     Hand Dominance        Extremity/Trunk Assessment   Upper Extremity Assessment Upper Extremity Assessment: Generalized weakness    Lower Extremity Assessment Lower Extremity Assessment: Generalized weakness;RLE deficits/detail RLE Deficits / Details: Grossly weak post op, ankle dorsiflexion/plantarflexion WFL    Cervical / Trunk Assessment Cervical / Trunk Assessment: Kyphotic  Communication   Communication: Expressive difficulties(difficult to understand speech at times)  Cognition Arousal/Alertness: Awake/alert Behavior During Therapy: WFL for tasks assessed/performed Overall Cognitive Status: No family/caregiver present to determine baseline cognitive functioning  General Comments: Pt very fearful/anxious with mobility, consistently repeating that she "didn't want to mess anything up." Pt stated she was going to "be here a couple of weeks, and she was going to take it slow."      General Comments      Exercises  General Exercises - Lower Extremity Ankle Circles/Pumps: Right;10 reps;Supine Long Arc Quad: Left;10 reps;Supine Hip Flexion/Marching: Left;10 reps;Supine   Assessment/Plan    PT Assessment Patient needs continued PT services  PT Problem List Decreased strength;Decreased range of motion;Decreased activity tolerance;Decreased balance;Decreased mobility;Decreased cognition;Decreased safety awareness;Pain       PT Treatment Interventions DME instruction;Gait training;Functional mobility training;Therapeutic activities;Therapeutic exercise;Balance training;Patient/family education    PT Goals (Current goals can be found in the Care Plan section)  Acute Rehab PT Goals Patient Stated Goal: "to not mess anything up." PT Goal Formulation: With patient Time For Goal Achievement: 03/26/20 Potential to Achieve Goals: Fair    Frequency Min 3X/week   Barriers to discharge        Co-evaluation               AM-PAC PT "6 Clicks" Mobility  Outcome Measure Help needed turning from your back to your side while in a flat bed without using bedrails?: A Little Help needed moving from lying on your back to sitting on the side of a flat bed without using bedrails?: A Lot Help needed moving to and from a bed to a chair (including a wheelchair)?: Total Help needed standing up from a chair using your arms (e.g., wheelchair or bedside chair)?: Total Help needed to walk in hospital room?: Total Help needed climbing 3-5 steps with a railing? : Total 6 Click Score: 9    End of Session   Activity Tolerance: Patient limited by pain Patient left: in bed;with call bell/phone within reach;with bed alarm set   PT Visit Diagnosis: Pain;Other abnormalities of gait and mobility (R26.89);Muscle weakness (generalized) (M62.81) Pain - Right/Left: Right Pain - part of body: Leg    Time: 4540-9811 PT Time Calculation (min) (ACUTE ONLY): 26 min   Charges:   PT Evaluation $PT Eval Moderate Complexity:  1 Mod PT Treatments $Therapeutic Activity: 8-22 mins          Jennifer Patel, PT, DPT Acute Rehabilitation Services Pager [phone] Office [phone]   Jennifer Patel 03/12/2020, 4:41 PM

## 2020-03-12 NOTE — Progress Notes (Addendum)
Progress Note    Jennifer Patel  NTZ:[id number] DOB: February 01, 1932  DOA: 03/11/2020 PCP: Anda Kraft, MD    Brief Narrative:    Medical records reviewed and are as summarized below:  Jennifer Patel is a very pleasant  86 y.o. female past medical history hypertension, TIA, hypothyroidism, hyperlipidemia, gout admitted May 20 after a mechanical fall with right femur fracture.  Underwent ORIF on May 21 per orthopedics.  Assessment/Plan:   Principal Problem:   Femur fracture, right (HCC) Active Problems:   Renal insufficiency   Anemia   Hypertension   Hypothyroidism   Hyperlipidemia  #1.  Right femoral fracture secondary to mechanical fall.  Underwent ORIF per orthopedic surgery today.  At the time of my exam she is awake alert requesting food.  Denies any pain -Pain management -PT -Likely will require placement  #2.  Hypertension.  History of difficult to control hypertension.  Home medications include amlodipine, Lasix, hydralazine, Bystolic, spironolactone.  Fair control postop. -We will resume home meds  #3.  Chronic kidney disease stage III/renal insufficiency.  Creatinine 1.5 which appears to be her baseline. -Monitor intake and output -Hold nephrotoxins as able -Monitor  #4.  Hypokalemia.  Mild.  Potassium level 3.4 preop.  This was repleted.  Potassium level today 3.9 -Monitor  #5. Anemia. Normocytic.  Hg 7.5 preop. Chart indicates blood loss in OR 171mls. 1 unit PRBC's given in OR.  -recheck this afternoon -transfuse as indicated -monitor  Family Communication/Anticipated D/C date and plan/Code Status   DVT prophylaxis: Lovenox ordered. Code Status: Full Code.  Family Communication: patient Disposition Plan: Status is: Inpatient  Remains inpatient appropriate because:IV treatments appropriate due to intensity of illness or inability to take PO and Inpatient level of care appropriate due to severity of illness   Dispo: The patient is from: Home      Anticipated d/c is to: SNF              Anticipated d/c date is: 2 days              Patient currently is not medically stable to d/c.     Medical Consultants:    [facility]   Anti-Infectives:    None  Subjective:   Awake alert denies any pain or discomfort.  Reports "I feel so much better".  Objective:    Vitals:   03/12/20 1045 03/12/20 1100 03/12/20 1116 03/12/20 1308  BP: (!) 123/59 128/82 (!) 133/59 135/63  Pulse: 66 66 65 64  Resp: 19 (!) 22 16 14   Temp: 99.2 F (37.3 C) 99.1 F (37.3 C) 97.8 F (36.6 C) 98 F (36.7 C)  TempSrc:   Oral Oral  SpO2: 96% 94%  94%  Weight:      Height:        Intake/Output Summary (Last 24 hours) at 03/12/2020 1324 Last data filed at 03/12/2020 1040 Gross per 24 hour  Intake 1494.72 ml  Output 800 ml  Net 694.72 ml   Filed Weights   03/11/20 1204  Weight: 45.4 kg    Exam: General: Petite somewhat frail appearing no acute distress CV: Regular rate and rhythm I hear no murmur gallop or rub no lower extremity edema.  Dressing to right foot and leg dry and intact.  Toes warm to touch Respiratory: No increased work of breathing breath sounds are clear bilaterally I hear no crackles no wheezes Abdomen: Nondistended soft positive bowel sounds throughout nontender to palpation no guarding or rebound  Musculoskeletal: Joints without swelling/erythema dressing to the left lower leg dry and intact toes warm to touch Neuro: Alert and oriented x3 speech clear facial symmetry  Data Reviewed:   I have personally reviewed following labs and imaging studies:  Labs: Labs show the following:   Basic Metabolic Panel: Recent Labs  Lab 03/11/20 1307 03/11/20 1933 03/12/20 0642  NA 140  --  138  K 3.4*  --  3.9  CL 104  --  106  CO2 22  --  21*  GLUCOSE 112*  --  122*  BUN 23  --  20  CREATININE 1.61*  --  1.29*  CALCIUM 8.3*  --  7.8*  MG  --  1.0*  --    GFR Estimated Creatinine Clearance: 13.8 mL/min (A) (by C-G  formula based on SCr of 1.29 mg/dL (H)). Liver Function Tests: Recent Labs  Lab 03/11/20 1307  AST 25  ALT 15  ALKPHOS 75  BILITOT 0.8  PROT 7.0  ALBUMIN 3.8   No results for input(s): LIPASE, AMYLASE in the last 168 hours. No results for input(s): AMMONIA in the last 168 hours. Coagulation profile Recent Labs  Lab 03/11/20 1307  INR 1.1    CBC: Recent Labs  Lab 03/11/20 1307 03/12/20 0642  WBC 7.3 6.2  NEUTROABS 5.6  --   HGB 9.7* 7.5*  HCT 30.5* 23.4*  MCV 99.0 98.3  PLT 206 179   Cardiac Enzymes: No results for input(s): CKTOTAL, CKMB, CKMBINDEX, TROPONINI in the last 168 hours. BNP (last 3 results) No results for input(s): PROBNP in the last 8760 hours. CBG: No results for input(s): GLUCAP in the last 168 hours. D-Dimer: No results for input(s): DDIMER in the last 72 hours. Hgb A1c: No results for input(s): HGBA1C in the last 72 hours. Lipid Profile: No results for input(s): CHOL, HDL, LDLCALC, TRIG, CHOLHDL, LDLDIRECT in the last 72 hours. Thyroid function studies: No results for input(s): TSH, T4TOTAL, T3FREE, THYROIDAB in the last 72 hours.  Invalid input(s): FREET3 Anemia work up: Recent Labs    03/11/20 1933  FERRITIN 34  TIBC 318  IRON 21*   Sepsis Labs: Recent Labs  Lab 03/11/20 1307 03/12/20 0642  WBC 7.3 6.2    Microbiology Recent Results (from the past 240 hour(s))  SARS Coronavirus 2 by RT PCR (hospital order, performed in [facility] hospital lab) Nasopharyngeal Nasopharyngeal Swab     Status: None   Collection Time: 03/11/20  1:07 PM   Specimen: Nasopharyngeal Swab  Result Value Ref Range Status   SARS Coronavirus 2 NEGATIVE NEGATIVE Final    Comment: (NOTE) SARS-CoV-2 target nucleic acids are NOT DETECTED. The SARS-CoV-2 RNA is generally detectable in upper and lower respiratory specimens during the acute phase of infection. The lowest concentration of SARS-CoV-2 viral copies this assay can detect is 250 copies / mL. A  negative result does not preclude SARS-CoV-2 infection and should not be used as the sole basis for treatment or other patient management decisions.  A negative result may occur with improper specimen collection / handling, submission of specimen other than nasopharyngeal swab, presence of viral mutation(s) within the areas targeted by this assay, and inadequate number of viral copies (<250 copies / mL). A negative result must be combined with clinical observations, patient history, and epidemiological information. Fact Sheet for Patients:   StrictlyIdeas.no Fact Sheet for Healthcare Providers: BankingDealers.co.za This test is not yet approved or cleared  by the [place] FDA and has been authorized  for detection and/or diagnosis of SARS-CoV-2 by FDA under an Emergency Use Authorization (EUA).  This EUA will remain in effect (meaning this test can be used) for the duration of the COVID-19 declaration under Section 564(b)(1) of the Act, 21 U.S.C. section 360bbb-3(b)(1), unless the authorization is terminated or revoked sooner. Performed at [facility], [place] [address], [place], [place] [address]   Surgical pcr screen     Status: None   Collection Time: 03/11/20 11:59 PM   Specimen: Nasal Mucosa; Nasal Swab  Result Value Ref Range Status   MRSA, PCR NEGATIVE NEGATIVE Final   Staphylococcus aureus NEGATIVE NEGATIVE Final    Comment: (NOTE) The Xpert SA Assay (FDA approved for NASAL specimens in patients 57 years of age and older), is one component of a comprehensive surveillance program. It is not intended to diagnose infection nor to guide or monitor treatment. Performed at [facility], [place] [address], [place], [place] [address]     Procedures and diagnostic studies:  DG Knee 1-2 Views Right  Result Date: 03/11/2020 CLINICAL DATA:  Fall with pain. EXAM: RIGHT KNEE - 1-2 VIEW COMPARISON:  None. FINDINGS: Comminuted  displaced fracture of the distal femoral metaphyseal region. Anterior displacement of the proximal fragment by the with of the bone. Relationship between the femur and the tibia remain intact. No fracture line seen extending to the articular surface. No patellar fracture. IMPRESSION: Comminuted fracture of the femoral metaphysis with posterior displacement of the distal fracture fragments. Electronically Signed   By: Nelson Chimes M.D.   On: 03/11/2020 12:38   CT Head Wo Contrast  Result Date: 03/11/2020 CLINICAL DATA:  Fall earlier today. EXAM: CT HEAD WITHOUT CONTRAST CT CERVICAL SPINE WITHOUT CONTRAST TECHNIQUE: Multidetector CT imaging of the head and cervical spine was performed following the standard protocol without intravenous contrast. Multiplanar CT image reconstructions of the cervical spine were also generated. COMPARISON:  06/23/2008 FINDINGS: CT HEAD FINDINGS Brain: Ventricles and cisterns are within normal. There is mild age related atrophic change. There is chronic ischemic microvascular disease. No mass, mass effect, shift of midline structures or acute hemorrhage. No evidence of acute infarction. Vascular: No hyperdense vessel or unexpected calcification. Skull: Normal. Negative for fracture or focal lesion. Sinuses/Orbits: No acute finding. Other: None. CT CERVICAL SPINE FINDINGS Alignment: No evidence of posttraumatic subluxation. Patient is kyphotic. Skull base and vertebrae: Mild spondylosis throughout the cervical spine. Atlantoaxial articulation is unremarkable. There is moderate facet arthropathy. No acute fracture or subluxation. Soft tissues and spinal canal: No prevertebral fluid or swelling. No visible canal hematoma. Disc levels:  Minimal disc space narrowing at the C5-6 level. Upper chest: No acute findings. Other: None. IMPRESSION: 1.  No acute brain injury. 2.  Age related atrophy and chronic ischemic microvascular disease. 3.  No acute cervical spine injury. 4. Mild spondylosis  of the cervical spine with minimal disc disease at the C5-6 level. Electronically Signed   By: Marin Olp M.D.   On: 03/11/2020 15:03   CT Cervical Spine Wo Contrast  Result Date: 03/11/2020 CLINICAL DATA:  Fall earlier today. EXAM: CT HEAD WITHOUT CONTRAST CT CERVICAL SPINE WITHOUT CONTRAST TECHNIQUE: Multidetector CT imaging of the head and cervical spine was performed following the standard protocol without intravenous contrast. Multiplanar CT image reconstructions of the cervical spine were also generated. COMPARISON:  06/23/2008 FINDINGS: CT HEAD FINDINGS Brain: Ventricles and cisterns are within normal. There is mild age related atrophic change. There is chronic ischemic microvascular disease. No  mass, mass effect, shift of midline structures or acute hemorrhage. No evidence of acute infarction. Vascular: No hyperdense vessel or unexpected calcification. Skull: Normal. Negative for fracture or focal lesion. Sinuses/Orbits: No acute finding. Other: None. CT CERVICAL SPINE FINDINGS Alignment: No evidence of posttraumatic subluxation. Patient is kyphotic. Skull base and vertebrae: Mild spondylosis throughout the cervical spine. Atlantoaxial articulation is unremarkable. There is moderate facet arthropathy. No acute fracture or subluxation. Soft tissues and spinal canal: No prevertebral fluid or swelling. No visible canal hematoma. Disc levels:  Minimal disc space narrowing at the C5-6 level. Upper chest: No acute findings. Other: None. IMPRESSION: 1.  No acute brain injury. 2.  Age related atrophy and chronic ischemic microvascular disease. 3.  No acute cervical spine injury. 4. Mild spondylosis of the cervical spine with minimal disc disease at the C5-6 level. Electronically Signed   By: Marin Olp M.D.   On: 03/11/2020 15:03   CT KNEE RIGHT WO CONTRAST  Result Date: 03/11/2020 CLINICAL DATA:  Evaluate distal femur fracture EXAM: CT OF THE RIGHT KNEE WITHOUT CONTRAST TECHNIQUE: Multidetector CT  imaging of the right knee was performed according to the standard protocol. Multiplanar CT image reconstructions were also generated. COMPARISON:  X-ray 03/11/2020 FINDINGS: Bones/Joint/Cartilage Redemonstration of an acute comminuted fracture of the distal femoral metaphysis with approximately 1 shaft width of posterior displacement. Fracture is also slightly medially displaced. The proximal fracture component abuts the superior pole of the patella (series 20, image 38). No evidence of patellar fracture. Tricompartmental osteoarthritis of the right knee is most pronounced within the lateral tibiofemoral compartment with moderate-severe joint space narrowing, subchondral sclerosis, subchondral cystic changes and prominent marginal osteophytosis. The proximal tibia and fibula appear intact without fracture. There is a large lipohemarthrosis. Ligaments Suboptimally assessed by CT. Muscles and Tendons No definite acute tendinous injury. The quadriceps and patellar tendons appear grossly intact. Grossly preserved muscle bulk. Soft tissues There is soft tissue hemorrhage adjacent to the fracture site. No soft tissue air to suggest open fracture. IMPRESSION: 1. Acute comminuted fracture of the distal femoral metaphysis with approximately 1 shaft width of posterior displacement. The proximal fracture component abuts the superior pole of the patella without evidence of patellar fracture hila are. 2. Large lipohemarthrosis. 3. Tricompartmental osteoarthritis of the right knee is most pronounced within the lateral tibiofemoral compartment. Electronically Signed   By: Davina Poke D.O.   On: 03/11/2020 15:15   DG Chest Port 1 View  Result Date: 03/11/2020 CLINICAL DATA:  Fall. EXAM: PORTABLE CHEST 1 VIEW COMPARISON:  May 08, 2019. FINDINGS: Stable cardiomegaly. Atherosclerosis of thoracic aorta is noted. No pneumothorax or pleural effusion is noted. Lungs are clear. Large hiatal hernia is noted. Bony thorax is  unremarkable. IMPRESSION: Aortic atherosclerosis. Large hiatal hernia. No acute cardiopulmonary abnormality seen. Aortic Atherosclerosis (ICD10-I70.0). Electronically Signed   By: Marijo Conception M.D.   On: 03/11/2020 12:39   DG Knee Right Port  Result Date: 03/12/2020 CLINICAL DATA:  ORIF femur fracture EXAM: PORTABLE RIGHT KNEE - 1-2 VIEW COMPARISON:  03/11/2020 FINDINGS: Fracture of the distal femoral metaphysis has been reduced and interval placement of a lateral plate and multiple screws across the fracture. Fracture alignment is satisfactory. Hardware positioning is satisfactory. Advanced degenerative change in the lateral joint compartment. IMPRESSION: Satisfactory reduction and fixation of distal femur fracture. Electronically Signed   By: Franchot Gallo M.D.   On: 03/12/2020 11:47   DG C-Arm 1-60 Min  Result Date: 03/12/2020 CLINICAL DATA:  Open reduction internal  fixation for fracture EXAM: DG C-ARM 1-60 MIN; RIGHT FEMUR 2 VIEWS FLUOROSCOPY TIME:  Fluoroscopy Time: 1 minutes 0 seconds Number of Acquired Spot Images: 8 COMPARISON:  Preoperative examination right femur Mar 11, 2020 FINDINGS: Initial images again demonstrate comminuted fracture of the distal femoral metaphysis with impaction. Subsequent frontal and lateral images show screw and plate fixation through the fracture of the distal femur with alignment overall anatomic after fixation. No new fractures evident. No evident knee dislocation. Degenerative change noted in the patellofemoral joint. IMPRESSION: Open reduction internal fixation for comminuted fracture distal femoral metaphysis with alignment near anatomic after fixation. No dislocation. No new fracture evident. Electronically Signed   By: Lowella Grip III M.D.   On: 03/12/2020 11:10   DG FEMUR, MIN 2 VIEWS RIGHT  Result Date: 03/12/2020 CLINICAL DATA:  Open reduction internal fixation for fracture EXAM: DG C-ARM 1-60 MIN; RIGHT FEMUR 2 VIEWS FLUOROSCOPY TIME:  Fluoroscopy  Time: 1 minutes 0 seconds Number of Acquired Spot Images: 8 COMPARISON:  Preoperative examination right femur Mar 11, 2020 FINDINGS: Initial images again demonstrate comminuted fracture of the distal femoral metaphysis with impaction. Subsequent frontal and lateral images show screw and plate fixation through the fracture of the distal femur with alignment overall anatomic after fixation. No new fractures evident. No evident knee dislocation. Degenerative change noted in the patellofemoral joint. IMPRESSION: Open reduction internal fixation for comminuted fracture distal femoral metaphysis with alignment near anatomic after fixation. No dislocation. No new fracture evident. Electronically Signed   By: Lowella Grip III M.D.   On: 03/12/2020 11:10    Medications:    allopurinol  200 mg Oral Daily   amLODipine  5 mg Oral QHS   aspirin EC  81 mg Oral Daily   atorvastatin  10 mg Oral Daily   [START ON 03/13/2020] enoxaparin (LOVENOX) injection  30 mg Subcutaneous Q24H   [START ON 03/13/2020] feeding supplement (ENSURE ENLIVE)  237 mL Oral QHS   furosemide  20 mg Oral Q M,W,F   hydrALAZINE  100 mg Oral TID   levothyroxine  75 mcg Oral Q0600   [START ON 03/13/2020] multivitamin with minerals  1 tablet Oral Daily   nebivolol  10 mg Oral Daily   pantoprazole  20 mg Oral Daily   [START ON 03/13/2020] pneumococcal 23 valent vaccine  0.5 mL Intramuscular Tomorrow-1000   spironolactone  12.5 mg Oral Daily   Continuous Infusions:   ceFAZolin (ANCEF) IV     lactated ringers 10 mL/hr at 03/12/20 1128     LOS: 1 day   [facility] M NP  [facility]   How to contact the [facility] Attending or Consulting provider [place] or covering provider during after hours [place], for this patient?  1. Check the care team in [facility] and look for a) attending/consulting [facility] provider listed and b) the [facility] team listed 2. Log into [email or web address] and use [place]'s universal password to access. If you do  not have the password, please contact the hospital operator. 3. Locate the [facility] provider you are looking for under [facility] and page to a number that you can be directly reached. 4. If you still have difficulty reaching the provider, please page the [facility] (Director on Call) for the Hospitalists listed on amion for assistance.  03/12/2020, 1:24 PM

## 2020-03-13 DIAGNOSIS — I10 Essential (primary) hypertension: Secondary | ICD-10-CM

## 2020-03-13 LAB — CBC
HCT: 24.7 % — ABNORMAL LOW (ref 36.0–46.0)
Hemoglobin: 8 g/dL — ABNORMAL LOW (ref 12.0–15.0)
MCH: 30.1 pg (ref 26.0–34.0)
MCHC: 32.4 g/dL (ref 30.0–36.0)
MCV: 92.9 fL (ref 80.0–100.0)
Platelets: 152 10*3/uL (ref 150–400)
RBC: 2.66 MIL/uL — ABNORMAL LOW (ref 3.87–5.11)
RDW: 17.2 % — ABNORMAL HIGH (ref 11.5–15.5)
WBC: 8.3 10*3/uL (ref 4.0–10.5)
nRBC: 0.2 % (ref 0.0–0.2)

## 2020-03-13 LAB — BASIC METABOLIC PANEL
Anion gap: 10 (ref 5–15)
BUN: 27 mg/dL — ABNORMAL HIGH (ref 8–23)
CO2: 20 mmol/L — ABNORMAL LOW (ref 22–32)
Calcium: 8 mg/dL — ABNORMAL LOW (ref 8.9–10.3)
Chloride: 105 mmol/L (ref 98–111)
Creatinine, Ser: 1.36 mg/dL — ABNORMAL HIGH (ref 0.44–1.00)
GFR calc Af Amer: 41 mL/min — ABNORMAL LOW (ref >60)
GFR calc non Af Amer: 35 mL/min — ABNORMAL LOW (ref >60)
Glucose, Bld: 126 mg/dL — ABNORMAL HIGH (ref 70–99)
Potassium: 3.9 mmol/L (ref 3.5–5.1)
Sodium: 135 mmol/L (ref 135–145)

## 2020-03-13 LAB — VITAMIN D 25 HYDROXY (VIT D DEFICIENCY, FRACTURES): Vit D, 25-Hydroxy: 32.21 ng/mL (ref 30–100)

## 2020-03-13 MED ORDER — HALOPERIDOL LACTATE 5 MG/ML IJ SOLN: 1.0000 mg | Freq: Four times a day (QID) | INTRAMUSCULAR | Status: DC | PRN

## 2020-03-13 MED ORDER — MAGNESIUM SULFATE 2 GM/50ML IV SOLN
2.0000 g | Freq: Once | INTRAVENOUS | Status: AC
  Administered 2020-03-13: 2 g via INTRAVENOUS
  Filled 2020-03-13: qty 50

## 2020-03-13 MED ORDER — ACETAMINOPHEN 500 MG PO TABS
500.0000 mg | ORAL_TABLET | Freq: Four times a day (QID) | ORAL | Status: DC
  Administered 2020-03-13 – 2020-03-15 (×9): 500 mg via ORAL
  Filled 2020-03-13 (×8): qty 1

## 2020-03-13 MED ORDER — HYDROCODONE-ACETAMINOPHEN 5-325 MG PO TABS
1.0000 | ORAL_TABLET | Freq: Four times a day (QID) | ORAL | Status: DC | PRN
  Administered 2020-03-13: 2 via ORAL
  Filled 2020-03-13: qty 2

## 2020-03-13 MED ORDER — LORAZEPAM 2 MG/ML IJ SOLN
0.5000 mg | Freq: Once | INTRAMUSCULAR | Status: AC
  Administered 2020-03-13: 0.5 mg via INTRAVENOUS
  Filled 2020-03-13: qty 1

## 2020-03-13 NOTE — Progress Notes (Signed)
Pt requesting return of property locked in the safe.  I advised patient I would get them for her but that I wouldn't be able to leave the floor until 2 or 3 am.  Pt stated she understood.    I spoke with daughter Hassan Rowan by phone and explained the situation to her.  Hassan Rowan attempted to explain to her mother.  Pt continuing to ring out by call bell requesting envelope.    Manuela Schwartz AD spoke extensively with patient, explaining to her that her money was in the safest place it could be in the safe.  Pt alert & oriented.  She understands that the hospital will not be responsible for any loss.  With that understanding, pt requested return of her "envelope" and was agreeable to waiting until floor RN could leave the floor to obtain it from security.

## 2020-03-13 NOTE — Progress Notes (Addendum)
Approx 2 am - responded to bed alarm in patient room.  Found pt scooted down in the bed with legs partway off the bed.  Pt states she saw "Batman" her dog on the counter across from her bed, that it "scared the life out of her" and she was getting up to get him.  Pt unhooked her IVF.    Pt is alert and oriented x 4, but experiencing visual hallucinations.   Returned to pt room to check on her, she reports seeing her dog on top of the Shar[employer] container.    MD notified, 0.5 ativan ordered and administered.    420 am - pt door closed by?  Checked on patient, found her attempting to climb out of the bed again.  Pt reports seeing a baby by her door.  NT attempting to slide pt up in bed, pt is fighting her.  Pt insists she is going to get up and go to her bed.  When asked, she says she is at home.  Re-oriented patient, cleaned her up and replaced bed pads.  I asked pt to please stay in the bed.     5 am - Pt asleep for first time in 2 days (other than anesthesia).

## 2020-03-13 NOTE — Progress Notes (Signed)
Progress Note    Jennifer Patel  PIR:[id number] DOB: 1932-04-16  DOA: 03/11/2020 PCP: Anda Kraft, MD    Brief Narrative:    Medical records reviewed and are as summarized below:  Jennifer Patel is a very pleasant  86 y.o. female past medical history hypertension, TIA, hypothyroidism, hyperlipidemia, gout admitted May 20 after a mechanical fall with right femur fracture.  Underwent ORIF on May 21 per orthopedics.  Assessment/Plan:   Principal Problem:   Femur fracture, right (HCC) Active Problems:   Hypertension   Hypothyroidism   Hyperlipidemia   Renal insufficiency   Anemia  #1.  Right femoral fracture secondary to mechanical fall.  Underwent ORIF 5/21 -Pain management -PT -Likely will require placement  #2.  Hypertension.  History of difficult to control hypertension.  Home medications include amlodipine, Lasix, hydralazine, Bystolic, spironolactone.  Fair control postop. -We will resume home meds  #3.  Chronic kidney disease stage IIIa.  . -Monitor intake and output -Hold nephrotoxins as able -Monitor  #4.  Hypokalemia.  Mild.   -repleted  #5. Anemia. Normocytic.  Hg 7.5 preop.  - 1 unit PRBC's given in OR.  -transfuse as needed  #6 expected post op delirium -delirium precautions -would NOT USE ATIVAN -PRN HALDOL if re-direction not working  Family Communication/Anticipated D/C date and plan/Code Status   DVT prophylaxis: Lovenox ordered. Code Status: Full Code.  Family Communication: patient Disposition Plan: Status is: Inpatient  Remains inpatient appropriate because:IV treatments appropriate due to intensity of illness or inability to take PO and Inpatient level of care appropriate due to severity of illness   Dispo: The patient is from: Home              Anticipated d/c is to: SNF              Anticipated d/c date is: 2 days              Patient currently is medically stable to d/c.     Medical Consultants:     ortho     Subjective:   Calm at nursing station (per chart review had issues with behavior last PM worsened by ativan)  Objective:    Vitals:   03/12/20 1308 03/12/20 1927 03/12/20 2328 03/13/20 0420  BP: 135/63 130/68 (!) 149/73 131/64  Pulse: 64 73 75 74  Resp: 14 16 16 16   Temp: 98 F (36.7 C) (!) 97.5 F (36.4 C) 98.3 F (36.8 C) 97.7 F (36.5 C)  TempSrc: Oral Oral Oral Oral  SpO2: 94% 92% 94% 95%  Weight:      Height:        Intake/Output Summary (Last 24 hours) at 03/13/2020 1110 Last data filed at 03/13/2020 0600 Gross per 24 hour  Intake 706.46 ml  Output 300 ml  Net 406.46 ml   Filed Weights   03/11/20 1204  Weight: 45.4 kg    Exam:  General: Appearance:    Frail female in no acute distress     Lungs:     respirations unlabored  Heart:    Normal heart rate. Normal rhythm. No murmurs, rubs, or gallops.   MS:   All extremities are intact.   Neurologic:  pleasant and cooperative-- at nursing station     Data Reviewed:   I have personally reviewed following labs and imaging studies:  Labs: Labs show the following:   Basic Metabolic Panel: Recent Labs  Lab 03/11/20 1307 03/11/20 1307 03/11/20 1933 03/12/20 6301  03/13/20 0353  NA 140  --   --  138 135  K 3.4*   < >  --  3.9 3.9  CL 104  --   --  106 105  CO2 22  --   --  21* 20*  GLUCOSE 112*  --   --  122* 126*  BUN 23  --   --  20 27*  CREATININE 1.61*  --   --  1.29* 1.36*  CALCIUM 8.3*  --   --  7.8* 8.0*  MG  --   --  1.0*  --   --    < > = values in this interval not displayed.   GFR Estimated Creatinine Clearance: 13.1 mL/min (A) (by C-G formula based on SCr of 1.36 mg/dL (H)). Liver Function Tests: Recent Labs  Lab 03/11/20 1307  AST 25  ALT 15  ALKPHOS 75  BILITOT 0.8  PROT 7.0  ALBUMIN 3.8   No results for input(s): LIPASE, AMYLASE in the last 168 hours. No results for input(s): AMMONIA in the last 168 hours. Coagulation profile Recent Labs  Lab  03/11/20 1307  INR 1.1    CBC: Recent Labs  Lab 03/11/20 1307 03/12/20 0642 03/12/20 1419 03/13/20 0353  WBC 7.3 6.2  --  8.3  NEUTROABS 5.6  --   --   --   HGB 9.7* 7.5* 9.3* 8.0*  HCT 30.5* 23.4* 29.0* 24.7*  MCV 99.0 98.3  --  92.9  PLT 206 179  --  152   Cardiac Enzymes: No results for input(s): CKTOTAL, CKMB, CKMBINDEX, TROPONINI in the last 168 hours. BNP (last 3 results) No results for input(s): PROBNP in the last 8760 hours. CBG: No results for input(s): GLUCAP in the last 168 hours. D-Dimer: No results for input(s): DDIMER in the last 72 hours. Hgb A1c: No results for input(s): HGBA1C in the last 72 hours. Lipid Profile: No results for input(s): CHOL, HDL, LDLCALC, TRIG, CHOLHDL, LDLDIRECT in the last 72 hours. Thyroid function studies: No results for input(s): TSH, T4TOTAL, T3FREE, THYROIDAB in the last 72 hours.  Invalid input(s): FREET3 Anemia work up: Recent Labs    03/11/20 1933  FERRITIN 34  TIBC 318  IRON 21*   Sepsis Labs: Recent Labs  Lab 03/11/20 1307 03/12/20 0642 03/13/20 0353  WBC 7.3 6.2 8.3    Microbiology Recent Results (from the past 240 hour(s))  SARS Coronavirus 2 by RT PCR (hospital order, performed in [facility] hospital lab) Nasopharyngeal Nasopharyngeal Swab     Status: None   Collection Time: 03/11/20  1:07 PM   Specimen: Nasopharyngeal Swab  Result Value Ref Range Status   SARS Coronavirus 2 NEGATIVE NEGATIVE Final    Comment: (NOTE) SARS-CoV-2 target nucleic acids are NOT DETECTED. The SARS-CoV-2 RNA is generally detectable in upper and lower respiratory specimens during the acute phase of infection. The lowest concentration of SARS-CoV-2 viral copies this assay can detect is 250 copies / mL. A negative result does not preclude SARS-CoV-2 infection and should not be used as the sole basis for treatment or other patient management decisions.  A negative result may occur with improper specimen collection / handling,  submission of specimen other than nasopharyngeal swab, presence of viral mutation(s) within the areas targeted by this assay, and inadequate number of viral copies (<250 copies / mL). A negative result must be combined with clinical observations, patient history, and epidemiological information. Fact Sheet for Patients:   StrictlyIdeas.no Fact Sheet for Healthcare  Providers: BankingDealers.co.za This test is not yet approved or cleared  by the [place] and has been authorized for detection and/or diagnosis of SARS-CoV-2 by FDA under an Emergency Use Authorization (EUA).  This EUA will remain in effect (meaning this test can be used) for the duration of the COVID-19 declaration under Section 564(b)(1) of the Act, 21 U.S.C. section 360bbb-3(b)(1), unless the authorization is terminated or revoked sooner. Performed at [facility], [place] [address] [place], [place] [address]   Surgical pcr screen     Status: None   Collection Time: 03/11/20 11:59 PM   Specimen: Nasal Mucosa; Nasal Swab  Result Value Ref Range Status   MRSA, PCR NEGATIVE NEGATIVE Final   Staphylococcus aureus NEGATIVE NEGATIVE Final    Comment: (NOTE) The Xpert SA Assay (FDA approved for NASAL specimens in patients 30 years of age and older), is one component of a comprehensive surveillance program. It is not intended to diagnose infection nor to guide or monitor treatment. Performed at [facility], [place] [address], [place], [place] [address]     Procedures and diagnostic studies:  DG Knee 1-2 Views Right  Result Date: 03/11/2020 CLINICAL DATA:  Fall with pain. EXAM: RIGHT KNEE - 1-2 VIEW COMPARISON:  None. FINDINGS: Comminuted displaced fracture of the distal femoral metaphyseal region. Anterior displacement of the proximal fragment by the with of the bone. Relationship between the femur and the tibia remain intact. No fracture line seen extending  to the articular surface. No patellar fracture. IMPRESSION: Comminuted fracture of the femoral metaphysis with posterior displacement of the distal fracture fragments. Electronically Signed   By: Nelson Chimes M.D.   On: 03/11/2020 12:38   CT Head Wo Contrast  Result Date: 03/11/2020 CLINICAL DATA:  Fall earlier today. EXAM: CT HEAD WITHOUT CONTRAST CT CERVICAL SPINE WITHOUT CONTRAST TECHNIQUE: Multidetector CT imaging of the head and cervical spine was performed following the standard protocol without intravenous contrast. Multiplanar CT image reconstructions of the cervical spine were also generated. COMPARISON:  06/23/2008 FINDINGS: CT HEAD FINDINGS Brain: Ventricles and cisterns are within normal. There is mild age related atrophic change. There is chronic ischemic microvascular disease. No mass, mass effect, shift of midline structures or acute hemorrhage. No evidence of acute infarction. Vascular: No hyperdense vessel or unexpected calcification. Skull: Normal. Negative for fracture or focal lesion. Sinuses/Orbits: No acute finding. Other: None. CT CERVICAL SPINE FINDINGS Alignment: No evidence of posttraumatic subluxation. Patient is kyphotic. Skull base and vertebrae: Mild spondylosis throughout the cervical spine. Atlantoaxial articulation is unremarkable. There is moderate facet arthropathy. No acute fracture or subluxation. Soft tissues and spinal canal: No prevertebral fluid or swelling. No visible canal hematoma. Disc levels:  Minimal disc space narrowing at the C5-6 level. Upper chest: No acute findings. Other: None. IMPRESSION: 1.  No acute brain injury. 2.  Age related atrophy and chronic ischemic microvascular disease. 3.  No acute cervical spine injury. 4. Mild spondylosis of the cervical spine with minimal disc disease at the C5-6 level. Electronically Signed   By: Marin Olp M.D.   On: 03/11/2020 15:03   CT Cervical Spine Wo Contrast  Result Date: 03/11/2020 CLINICAL DATA:  Fall earlier  today. EXAM: CT HEAD WITHOUT CONTRAST CT CERVICAL SPINE WITHOUT CONTRAST TECHNIQUE: Multidetector CT imaging of the head and cervical spine was performed following the standard protocol without intravenous contrast. Multiplanar CT image reconstructions of the cervical spine were also generated. COMPARISON:  06/23/2008 FINDINGS: CT HEAD FINDINGS Brain:  Ventricles and cisterns are within normal. There is mild age related atrophic change. There is chronic ischemic microvascular disease. No mass, mass effect, shift of midline structures or acute hemorrhage. No evidence of acute infarction. Vascular: No hyperdense vessel or unexpected calcification. Skull: Normal. Negative for fracture or focal lesion. Sinuses/Orbits: No acute finding. Other: None. CT CERVICAL SPINE FINDINGS Alignment: No evidence of posttraumatic subluxation. Patient is kyphotic. Skull base and vertebrae: Mild spondylosis throughout the cervical spine. Atlantoaxial articulation is unremarkable. There is moderate facet arthropathy. No acute fracture or subluxation. Soft tissues and spinal canal: No prevertebral fluid or swelling. No visible canal hematoma. Disc levels:  Minimal disc space narrowing at the C5-6 level. Upper chest: No acute findings. Other: None. IMPRESSION: 1.  No acute brain injury. 2.  Age related atrophy and chronic ischemic microvascular disease. 3.  No acute cervical spine injury. 4. Mild spondylosis of the cervical spine with minimal disc disease at the C5-6 level. Electronically Signed   By: Marin Olp M.D.   On: 03/11/2020 15:03   CT KNEE RIGHT WO CONTRAST  Result Date: 03/11/2020 CLINICAL DATA:  Evaluate distal femur fracture EXAM: CT OF THE RIGHT KNEE WITHOUT CONTRAST TECHNIQUE: Multidetector CT imaging of the right knee was performed according to the standard protocol. Multiplanar CT image reconstructions were also generated. COMPARISON:  X-ray 03/11/2020 FINDINGS: Bones/Joint/Cartilage Redemonstration of an acute  comminuted fracture of the distal femoral metaphysis with approximately 1 shaft width of posterior displacement. Fracture is also slightly medially displaced. The proximal fracture component abuts the superior pole of the patella (series 20, image 38). No evidence of patellar fracture. Tricompartmental osteoarthritis of the right knee is most pronounced within the lateral tibiofemoral compartment with moderate-severe joint space narrowing, subchondral sclerosis, subchondral cystic changes and prominent marginal osteophytosis. The proximal tibia and fibula appear intact without fracture. There is a large lipohemarthrosis. Ligaments Suboptimally assessed by CT. Muscles and Tendons No definite acute tendinous injury. The quadriceps and patellar tendons appear grossly intact. Grossly preserved muscle bulk. Soft tissues There is soft tissue hemorrhage adjacent to the fracture site. No soft tissue air to suggest open fracture. IMPRESSION: 1. Acute comminuted fracture of the distal femoral metaphysis with approximately 1 shaft width of posterior displacement. The proximal fracture component abuts the superior pole of the patella without evidence of patellar fracture hila are. 2. Large lipohemarthrosis. 3. Tricompartmental osteoarthritis of the right knee is most pronounced within the lateral tibiofemoral compartment. Electronically Signed   By: Davina Poke D.O.   On: 03/11/2020 15:15   DG Chest Port 1 View  Result Date: 03/11/2020 CLINICAL DATA:  Fall. EXAM: PORTABLE CHEST 1 VIEW COMPARISON:  May 08, 2019. FINDINGS: Stable cardiomegaly. Atherosclerosis of thoracic aorta is noted. No pneumothorax or pleural effusion is noted. Lungs are clear. Large hiatal hernia is noted. Bony thorax is unremarkable. IMPRESSION: Aortic atherosclerosis. Large hiatal hernia. No acute cardiopulmonary abnormality seen. Aortic Atherosclerosis (ICD10-I70.0). Electronically Signed   By: Marijo Conception M.D.   On: 03/11/2020 12:39   DG  Knee Right Port  Result Date: 03/12/2020 CLINICAL DATA:  ORIF femur fracture EXAM: PORTABLE RIGHT KNEE - 1-2 VIEW COMPARISON:  03/11/2020 FINDINGS: Fracture of the distal femoral metaphysis has been reduced and interval placement of a lateral plate and multiple screws across the fracture. Fracture alignment is satisfactory. Hardware positioning is satisfactory. Advanced degenerative change in the lateral joint compartment. IMPRESSION: Satisfactory reduction and fixation of distal femur fracture. Electronically Signed   By: Franchot Gallo M.D.  On: 03/12/2020 11:47   DG C-Arm 1-60 Min  Result Date: 03/12/2020 CLINICAL DATA:  Open reduction internal fixation for fracture EXAM: DG C-ARM 1-60 MIN; RIGHT FEMUR 2 VIEWS FLUOROSCOPY TIME:  Fluoroscopy Time: 1 minutes 0 seconds Number of Acquired Spot Images: 8 COMPARISON:  Preoperative examination right femur Mar 11, 2020 FINDINGS: Initial images again demonstrate comminuted fracture of the distal femoral metaphysis with impaction. Subsequent frontal and lateral images show screw and plate fixation through the fracture of the distal femur with alignment overall anatomic after fixation. No new fractures evident. No evident knee dislocation. Degenerative change noted in the patellofemoral joint. IMPRESSION: Open reduction internal fixation for comminuted fracture distal femoral metaphysis with alignment near anatomic after fixation. No dislocation. No new fracture evident. Electronically Signed   By: Lowella Grip III M.D.   On: 03/12/2020 11:10   DG FEMUR, MIN 2 VIEWS RIGHT  Result Date: 03/12/2020 CLINICAL DATA:  Open reduction internal fixation for fracture EXAM: DG C-ARM 1-60 MIN; RIGHT FEMUR 2 VIEWS FLUOROSCOPY TIME:  Fluoroscopy Time: 1 minutes 0 seconds Number of Acquired Spot Images: 8 COMPARISON:  Preoperative examination right femur Mar 11, 2020 FINDINGS: Initial images again demonstrate comminuted fracture of the distal femoral metaphysis with  impaction. Subsequent frontal and lateral images show screw and plate fixation through the fracture of the distal femur with alignment overall anatomic after fixation. No new fractures evident. No evident knee dislocation. Degenerative change noted in the patellofemoral joint. IMPRESSION: Open reduction internal fixation for comminuted fracture distal femoral metaphysis with alignment near anatomic after fixation. No dislocation. No new fracture evident. Electronically Signed   By: Lowella Grip III M.D.   On: 03/12/2020 11:10    Medications:   . acetaminophen  500 mg Oral Q6H  . allopurinol  200 mg Oral Daily  . amLODipine  5 mg Oral QHS  . aspirin EC  81 mg Oral Daily  . atorvastatin  10 mg Oral Daily  . enoxaparin (LOVENOX) injection  30 mg Subcutaneous Q24H  . feeding supplement (ENSURE ENLIVE)  237 mL Oral QHS  . furosemide  20 mg Oral Q M,W,F  . hydrALAZINE  100 mg Oral TID  . levothyroxine  75 mcg Oral Q0600  . multivitamin with minerals  1 tablet Oral Daily  . nebivolol  10 mg Oral Daily  . pantoprazole  20 mg Oral Daily  . pneumococcal 23 valent vaccine  0.5 mL Intramuscular Tomorrow-1000  . spironolactone  12.5 mg Oral Daily   Continuous Infusions: . lactated ringers 10 mL/hr at 03/13/20 0300     LOS: 2 days   Geradine Girt DO  [facility]   How to contact the [facility] Attending or Consulting provider [place] or covering provider during after hours [place], for this patient?  1. Check the care team in [facility] and look for a) attending/consulting [facility] provider listed and b) the [facility] team listed 2. Log into [email or web address] and use [place]'s universal password to access. If you do not have the password, please contact the hospital operator. 3. Locate the [facility] provider you are looking for under [facility] and page to a number that you can be directly reached. 4. If you still have difficulty reaching the provider, please page the [facility] (Director on Call) for the  Hospitalists listed on amion for assistance.  03/13/2020, 11:10 AM

## 2020-03-13 NOTE — Progress Notes (Signed)
Orthopaedic Trauma Progress Note  S: Trying to get up out of bed, several members of the nursing staff at bedside trying to calm her down and keep her in the bed.  Patient is disoriented, "trying to get up to go to the kitchen, does not want to sit on the couch any longer".  Was experiencing visual hallucinations last night, Ativan made this worse.  Trying to quit, kick, bite staff members.  O:  Vitals:   03/12/20 2328 03/13/20 0420  BP: (!) 149/73 131/64  Pulse: 75 74  Resp: 16 16  Temp: 98.3 F (36.8 C) 97.7 F (36.5 C)  SpO2: 94% 95%    General: Trying to get out of bed, is upset Respiratory: No increased work of breathing.  Right lower extremity: Dressing is clean, dry, intact.  Moving the leg well on her own.  Ankle dorsiflexion and plantarflexion is intact.  Able to actively flex her knee about 40 degrees.  Neurovascularly intact   Imaging: Stable post op imaging.   Labs:  Results for orders placed or performed during the hospital encounter of 03/11/20 (from the past 24 hour(s))  Prepare RBC (crossmatch)     Status: None   Collection Time: 03/12/20  8:04 AM  Result Value Ref Range   Order Confirmation      ORDER PROCESSED BY BLOOD BANK Performed at [facility], [address] [place], [place] [address]   Prepare RBC (crossmatch)     Status: None   Collection Time: 03/12/20  9:21 AM  Result Value Ref Range   Order Confirmation      ORDER PROCESSED BY BLOOD BANK Performed at [facility], [place] [address] [place], [place] [address]   Hemoglobin and hematocrit, blood     Status: Abnormal   Collection Time: 03/12/20  2:19 PM  Result Value Ref Range   Hemoglobin 9.3 (L) 12.0 - 15.0 g/dL   HCT 29.0 (L) 36.0 - 46.0 %  CBC     Status: Abnormal   Collection Time: 03/13/20  3:53 AM  Result Value Ref Range   WBC 8.3 4.0 - 10.5 K/uL   RBC 2.66 (L) 3.87 - 5.11 MIL/uL   Hemoglobin 8.0 (L) 12.0 - 15.0 g/dL   HCT 24.7 (L) 36.0 - 46.0 %   MCV 92.9 80.0 - 100.0 fL    MCH 30.1 26.0 - 34.0 pg   MCHC 32.4 30.0 - 36.0 g/dL   RDW 17.2 (H) 11.5 - 15.5 %   Platelets 152 150 - 400 K/uL   nRBC 0.2 0.0 - 0.2 %  Basic metabolic panel     Status: Abnormal   Collection Time: 03/13/20  3:53 AM  Result Value Ref Range   Sodium 135 135 - 145 mmol/L   Potassium 3.9 3.5 - 5.1 mmol/L   Chloride 105 98 - 111 mmol/L   CO2 20 (L) 22 - 32 mmol/L   Glucose, Bld 126 (H) 70 - 99 mg/dL   BUN 27 (H) 8 - 23 mg/dL   Creatinine, Ser 1.36 (H) 0.44 - 1.00 mg/dL   Calcium 8.0 (L) 8.9 - 10.3 mg/dL   GFR calc non Af Amer 35 (L) >60 mL/min   GFR calc Af Amer 41 (L) >60 mL/min   Anion gap 10 5 - 15    Assessment: 86 year old female s/p fall, 1 Day Post-Op   Injuries: Right supracondylar distal femur fracture s/p ORIF  Weightbearing: WBAT RLE  Insicional and dressing care: Plan to change  dressing tomorrow  Showering: Okay to begin showering with assistance on 03/15/2020 if incisions are clean, dry, intact  Orthopedic device(s): None   CV/Blood loss: Acute blood loss anemia, Hgb 8.0. Hemodynamically stable.  Received 1 unit PRBCs perioperatively  Pain management:  1. Tylenol 500 mg q 6 hours schedule 2. Norco 5-325 q 6 hours PRN 3. Morphine 0.5-1 mg q 2 hours PRN  VTE prophylaxis: Lovenox  SCDs: Ordered, will not tolerate nursing staff to place on her legs  ID:  Ancef 2gm post op  Foley/Lines:  No foley, KVO IVFs  Medical co-morbidities: gout, HTN, HLD, GERD, and thyroid disease  Impediments to Fracture Healing: Vit D level pending, will start supplementation as indicated.  Dispo: PT/OT evaluation today.  Will likely need SNF.  Plan to change dressing tomorrow.  Will schedule Tylenol, try to minimize narcotics  Follow - up plan: 2 weeks  Contact information:  Katha Hamming MD, Patrecia Pace PA-C   [facility] [phone] (office) [email or web address]

## 2020-03-13 NOTE — Evaluation (Signed)
Occupational Therapy Evaluation Patient Details Name: Jennifer Patel MRN: [id number] DOB: 18-Nov-1931 Today's Date: 03/13/2020    History of Present Illness Pt is a 86 y.o. F with significant PMH of hypertension, TIA who was admitted May 20 after a mechanical fall with right femur fracture. Underwent ORIF on 03/12/2020.    Clinical Impression   This 86 yo female admitted and underwent above presents to acute OT with PLOF of living alone and being able to do all of her own basic ADLs by herself and cooking for herself. She currently needs setup/S for UB ADLs and as much as Max A for LB ADLs and +2 for sit<>stand and transfers. She will continue to benefit from acute OT with follow up at SNF to work back towards her PLOF.    Follow Up Recommendations  SNF;Supervision/Assistance - 24 hour    Equipment Recommendations  Other (comment)(TBD at next venue)       Precautions / Restrictions Precautions Precautions: Fall Restrictions Weight Bearing Restrictions: No RLE Weight Bearing: Weight bearing as tolerated      Mobility Bed Mobility Overal bed mobility: Needs Assistance Bed Mobility: Sit to Supine       Sit to supine: Min assist(for RLE)      Transfers Overall transfer level: Needs assistance Equipment used: Rolling walker (2 wheeled) Transfers: Sit to/from Stand Sit to Stand: Min assist;+2 physical assistance              Balance Overall balance assessment: Needs assistance Sitting-balance support: No upper extremity supported;Feet supported Sitting balance-Leahy Scale: Good     Standing balance support: Bilateral upper extremity supported Standing balance-Leahy Scale: Poor Standing balance comment: reliant on RW and additional external A                           ADL either performed or assessed with clinical judgement   ADL Overall ADL's : Needs assistance/impaired Eating/Feeding: Independent;Sitting   Grooming: Set up;Sitting   Upper Body  Bathing: Set up;Sitting   Lower Body Bathing: Moderate assistance Lower Body Bathing Details (indicate cue type and reason): min A +2 sit<>stand Upper Body Dressing : Minimal assistance;Sitting   Lower Body Dressing: Maximal assistance Lower Body Dressing Details (indicate cue type and reason): min A +2 sit<>stand Toilet Transfer: Moderate assistance;+2 for physical assistance;RW;Stand-pivot;BSC   Toileting- Clothing Manipulation and Hygiene: Total assistance Toileting - Clothing Manipulation Details (indicate cue type and reason): min A +2 sit<>stand             Vision Patient Visual Report: No change from baseline              Pertinent Vitals/Pain Pain Assessment: Faces Faces Pain Scale: Hurts little more Pain Location: RLE with WB'ing Pain Descriptors / Indicators: Grimacing;Guarding;Sore Pain Intervention(s): Limited activity within patient's tolerance;Monitored during session;Repositioned     Hand Dominance Right   Extremity/Trunk Assessment Upper Extremity Assessment Upper Extremity Assessment: Generalized weakness           Communication Communication Communication: No difficulties   Cognition Arousal/Alertness: Awake/alert Behavior During Therapy: WFL for tasks assessed/performed Overall Cognitive Status: Within Functional Limits for tasks assessed                                 General Comments: Not fearful today of moving. Earlier pt kicking, scratching, hitting at staff (when asked if she knew she was confused this morning  she responded, "yes, they were making me that way". Pt still seeing her dog in her room (pointing at the sharps container, black on top--"that looks like a little dog". Pt has a [place] expects to be discharged to:: Skilled nursing facility Living Arrangements: Alone   Type of Home: Apartment Home Access: Level entry     Home Layout: Able to live on main level with  bedroom/bathroom     Bathroom Shower/Tub: Tub/shower unit         Home Equipment: [place] - single point;Walker - 2 wheels          Prior Functioning/Environment Level of Independence: Independent        Comments: Reports daughter online orders her groceries for delivery        OT Problem List: Decreased strength;Decreased range of motion;Impaired balance (sitting and/or standing);Pain;Decreased cognition      OT Treatment/Interventions: Self-care/ADL training;DME and/or AE instruction;Patient/family education;Balance training    OT Goals(Current goals can be found in the care plan section) Acute Rehab OT Goals Patient Stated Goal: to go back to my room and get back to bed (pt was sitting up at the nurses station in recliner just having finished her lunch) OT Goal Formulation: With patient Time For Goal Achievement: 03/27/20 Potential to Achieve Goals: Good  OT Frequency: Min 2X/week   Barriers to D/C: Decreased caregiver support             AM-PAC OT "6 Clicks" Daily Activity     Outcome Measure Help from another person eating meals?: None Help from another person taking care of personal grooming?: A Little Help from another person toileting, which includes using toliet, bedpan, or urinal?: A Lot Help from another person bathing (including washing, rinsing, drying)?: A Lot Help from another person to put on and taking off regular upper body clothing?: A Little Help from another person to put on and taking off regular lower body clothing?: A Lot 6 Click Score: 16   End of Session Equipment Utilized During Treatment: Gait belt;Rolling walker  Activity Tolerance: Patient tolerated treatment well Patient left: in bed;with call bell/phone within reach;with bed alarm set  OT Visit Diagnosis: Unsteadiness on feet (R26.81);Other abnormalities of gait and mobility (R26.89);Muscle weakness (generalized) (M62.81);Pain;Other symptoms and signs involving cognitive  function Pain - Right/Left: Right Pain - part of body: Leg                Time: 1342-1409 OT Time Calculation (min): 27 min Charges:  OT General Charges $OT Visit: 1 Visit OT Evaluation $OT Eval Moderate Complexity: 1 Mod OT Treatments $Self Care/Home Management : 8-22 mins  [place], OTR/L Acute [employer] Pager [phone] Office [phone]     Almon Register 03/13/2020, 3:28 PM

## 2020-03-14 LAB — BASIC METABOLIC PANEL
Anion gap: 7 (ref 5–15)
BUN: 46 mg/dL — ABNORMAL HIGH (ref 8–23)
CO2: 24 mmol/L (ref 22–32)
Calcium: 8.2 mg/dL — ABNORMAL LOW (ref 8.9–10.3)
Chloride: 106 mmol/L (ref 98–111)
Creatinine, Ser: 1.32 mg/dL — ABNORMAL HIGH (ref 0.44–1.00)
GFR calc Af Amer: 43 mL/min — ABNORMAL LOW (ref >60)
GFR calc non Af Amer: 37 mL/min — ABNORMAL LOW (ref >60)
Glucose, Bld: 107 mg/dL — ABNORMAL HIGH (ref 70–99)
Potassium: 4.8 mmol/L (ref 3.5–5.1)
Sodium: 137 mmol/L (ref 135–145)

## 2020-03-14 LAB — CBC
HCT: 23 % — ABNORMAL LOW (ref 36.0–46.0)
Hemoglobin: 7.3 g/dL — ABNORMAL LOW (ref 12.0–15.0)
MCH: 29.6 pg (ref 26.0–34.0)
MCHC: 31.7 g/dL (ref 30.0–36.0)
MCV: 93.1 fL (ref 80.0–100.0)
Platelets: 155 10*3/uL (ref 150–400)
RBC: 2.47 MIL/uL — ABNORMAL LOW (ref 3.87–5.11)
RDW: 17.1 % — ABNORMAL HIGH (ref 11.5–15.5)
WBC: 8.2 10*3/uL (ref 4.0–10.5)
nRBC: 0.2 % (ref 0.0–0.2)

## 2020-03-14 LAB — TYPE AND SCREEN
ABO/RH(D): O POS
Antibody Screen: NEGATIVE
Unit division: 0
Unit division: 0

## 2020-03-14 LAB — BPAM RBC
Blood Product Expiration Date: 202106232359
Blood Product Expiration Date: 202106232359
ISSUE DATE / TIME: 202105210923
ISSUE DATE / TIME: 202105210923
Unit Type and Rh: 5100
Unit Type and Rh: 5100

## 2020-03-14 LAB — PREPARE RBC (CROSSMATCH)

## 2020-03-14 LAB — MAGNESIUM: Magnesium: 1.9 mg/dL (ref 1.7–2.4)

## 2020-03-14 MED ORDER — FUROSEMIDE 10 MG/ML IJ SOLN
20.0000 mg | Freq: Once | INTRAMUSCULAR | Status: AC
  Administered 2020-03-14: 20 mg via INTRAVENOUS
  Filled 2020-03-14: qty 2

## 2020-03-14 MED ORDER — HYDRALAZINE HCL 25 MG PO TABS
25.0000 mg | ORAL_TABLET | Freq: Three times a day (TID) | ORAL | Status: DC
  Administered 2020-03-14 (×2): 25 mg via ORAL
  Filled 2020-03-14 (×3): qty 1

## 2020-03-14 MED ORDER — ENOXAPARIN SODIUM 30 MG/0.3ML ~~LOC~~ SOLN: 30.0000 mg | SUBCUTANEOUS | 0 refills | Status: DC

## 2020-03-14 MED ORDER — HYDROCODONE-ACETAMINOPHEN 5-325 MG PO TABS: 1.0000 | ORAL_TABLET | Freq: Four times a day (QID) | ORAL | 0 refills | Status: DC | PRN

## 2020-03-14 MED ORDER — SODIUM CHLORIDE 0.9% IV SOLUTION: Freq: Once | INTRAVENOUS | Status: AC

## 2020-03-14 NOTE — Plan of Care (Signed)
  Problem: Coping: Goal: Level of anxiety will decrease Outcome: Progressing   Problem: Pain Managment: Goal: General experience of comfort will improve Outcome: Progressing   Problem: Safety: Goal: Ability to remain free from injury will improve Outcome: Progressing   Problem: Skin Integrity: Goal: Risk for impaired skin integrity will decrease Outcome: Progressing   

## 2020-03-14 NOTE — Progress Notes (Signed)
First unit PRBC started. Patient was given teaching in regard to receiving blood. Nurse will monitor closely.

## 2020-03-14 NOTE — Plan of Care (Signed)
  Problem: Education: Goal: Knowledge of General Education information will improve Description: Including pain rating scale, medication(s)/side effects and non-pharmacologic comfort measures Outcome: Progressing   Problem: Clinical Measurements: Goal: Ability to maintain clinical measurements within normal limits will improve Outcome: Progressing   Problem: Clinical Measurements: Goal: Will remain free from infection Outcome: Progressing   Problem: Coping: Goal: Level of anxiety will decrease Outcome: Progressing   Problem: Pain Managment: Goal: General experience of comfort will improve Outcome: Progressing

## 2020-03-14 NOTE — NC FL2 (Signed)
[place] LEVEL OF CARE SCREENING TOOL     IDENTIFICATION  Patient Name: Jennifer Patel Birthdate: [birth year] Sex: female Admission Date (Current Location): 03/11/2020  [facility] and [place] Number:  Herbalist and Address:  [facility], [place] [address] [place], [place] [address]      Provider Number: [id number]  Attending Physician Name and Address:  Geradine Girt, DO  Relative Name and Phone Number:  [phone]    Current Level of Care: Hospital Recommended Level of Care: [place] Prior Approval Number:    Date Approved/Denied: 03/14/20 PASRR Number: [id number]  Discharge Plan: SNF    Current Diagnoses: Patient Active Problem List   Diagnosis Date Noted  . Anemia 03/12/2020  . Renal insufficiency   . Femur fracture, right ([place]) 03/11/2020  . Closed bicondylar fracture of distal end of right femur ([place])   . Resistant hypertension 11/13/2015  . AKI (acute kidney injury) ([place]) 05/22/2015  . Abnormal EKG 05/22/2015  . Hypertension 05/22/2015  . Hypothyroidism 05/22/2015  . Hyperlipidemia 05/22/2015  . Neck pain 05/22/2015    Orientation RESPIRATION BLADDER Height & Weight     Self, Time, Situation, Place  Normal Incontinent, External catheter(External Urinary Catheter) Weight: 100 lb (45.4 kg) Height:  3\' 11"  (119.4 cm)  BEHAVIORAL SYMPTOMS/MOOD NEUROLOGICAL BOWEL NUTRITION STATUS      Continent Diet(See Discharge Summary)  AMBULATORY STATUS COMMUNICATION OF NEEDS Skin   Limited Assist Verbally Surgical wounds, Other (Comment)(Appropriate for ethnicity, Dry, Surgical Incision, Incision closed Thigh)                       Personal Care Assistance Level of Assistance  Bathing, Feeding, Dressing Bathing Assistance: Limited assistance Feeding assistance: Independent(able to feed self;cardiac diet) Dressing Assistance: Limited assistance     Functional Limitations Info  Sight,  Hearing, Speech Sight Info: Adequate Hearing Info: Adequate Speech Info: Adequate    SPECIAL CARE FACTORS FREQUENCY  PT (By licensed PT), OT (By licensed OT)     PT Frequency: 5x min weekly OT Frequency: 5x min weekly            Contractures Contractures Info: Not present    Additional Factors Info  Code Status, Allergies Code Status Info: FULL Allergies Info: Clarithromycin,Prednisone           Current Medications (03/14/2020):  This is the current hospital active medication list Current Facility-Administered Medications  Medication Dose Route Frequency Provider Last Rate Last Admin  . 0.9 %  sodium chloride infusion (Manually program via Guardrails IV Fluids)   Intravenous Once Eulogio Bear U, DO      . acetaminophen (TYLENOL) tablet 500 mg  500 mg Oral Q6H Patrecia Pace A, PA-C   500 mg at 03/14/20 0544  . allopurinol (ZYLOPRIM) tablet 200 mg  200 mg Oral Daily Patrecia Pace A, PA-C   200 mg at 03/14/20 2025  . aspirin EC tablet 81 mg  81 mg Oral Daily Patrecia Pace A, PA-C   81 mg at 03/14/20 4270  . atorvastatin (LIPITOR) tablet 10 mg  10 mg Oral Daily Patrecia Pace A, PA-C   10 mg at 03/14/20 6237  . enoxaparin (LOVENOX) injection 30 mg  30 mg Subcutaneous Q24H Patrecia Pace A, PA-C   30 mg at 03/14/20 6283  . feeding supplement (ENSURE ENLIVE) (ENSURE ENLIVE) liquid 237 mL  237 mL Oral QHS Delray Alt, PA-C   237 mL at 03/13/20 2033  .  furosemide (LASIX) injection 20 mg  20 mg Intravenous Once Vann, Jessica U, DO      . furosemide (LASIX) tablet 20 mg  20 mg Oral Q M,W,F Patrecia Pace A, PA-C      . haloperidol lactate (HALDOL) injection 1 mg  1 mg Intravenous Q6H PRN Eulogio Bear U, DO      . hydrALAZINE (APRESOLINE) tablet 25 mg  25 mg Oral TID Eulogio Bear U, DO      . HYDROcodone-acetaminophen (NORCO/VICODIN) 5-325 MG per tablet 1-2 tablet  1-2 tablet Oral Q6H PRN Delray Alt, PA-C   2 tablet at 03/13/20 0849  . lactated ringers infusion   Intravenous  Continuous Delray Alt, PA-C 10 mL/hr at 03/13/20 1342 New Bag at 03/13/20 1342  . levothyroxine (SYNTHROID) tablet 75 mcg  75 mcg Oral Q0600 Delray Alt, PA-C   75 mcg at 03/14/20 0544  . morphine 2 MG/ML injection 0.5-1 mg  0.5-1 mg Intravenous Q2H PRN Delray Alt, PA-C      . multivitamin with minerals tablet 1 tablet  1 tablet Oral Daily Delray Alt, PA-C   1 tablet at 03/14/20 8938  . nebivolol (BYSTOLIC) tablet 10 mg  10 mg Oral Daily Patrecia Pace A, PA-C   10 mg at 03/14/20 1017  . pantoprazole (PROTONIX) EC tablet 20 mg  20 mg Oral Daily Patrecia Pace A, PA-C   20 mg at 03/14/20 0932  . pneumococcal 23 valent vaccine (PNEUMOVAX-23) injection 0.5 mL  0.5 mL Intramuscular Tomorrow-1000 Wynetta Fines T, MD      . polyvinyl alcohol (LIQUIFILM TEARS) 1.4 % ophthalmic solution   Both Eyes BID PRN Delray Alt, PA-C      . senna-docusate (Senokot-S) tablet 1 tablet  1 tablet Oral QHS PRN Delray Alt, PA-C      . spironolactone (ALDACTONE) tablet 12.5 mg  12.5 mg Oral Daily Patrecia Pace A, PA-C   12.5 mg at 03/14/20 5102     Discharge Medications: Please see discharge summary for a list of discharge medications.  Relevant Imaging Results:  Relevant Lab Results:   Additional Information [id number]  Trula Ore, LCSWA

## 2020-03-14 NOTE — Progress Notes (Signed)
The patient was noted to have no Blood Bank bracelet on when getting consent for blood transfusion.  Blood Bank was notified and will be drawing a Type and Screen, blood bank reported that the nurse will be notified when the "new" unit has been completed

## 2020-03-14 NOTE — Progress Notes (Signed)
Orthopaedic Trauma Progress Note  S: Doing much better this morning.  Is alert and oriented.  Mild pain in her right leg but current medication regimen is helping.  Denies any significant numbness or tingling through her lower extremity.  Does not have any questions or concerns currently.  Is hoping to get up to bedside chair with therapies today.  Hemoglobin dropped to 7.3 this morning, denies any related symptoms.  Scheduled to receive 1 unit PRBCs  O:  Vitals:   03/14/20 0306 03/14/20 0741  BP: (!) 114/55 (!) 108/45  Pulse: 80 80  Resp: 14 18  Temp: 98 F (36.7 C) 98.3 F (36.8 C)  SpO2: 93% 92%    General: Sitting up in bed, no acute distress.  Pleasant and cooperative Respiratory: No increased work of breathing.  Right lower extremity: Ace wrap removed, dressing remains clean, dry, intact.  Tenderness with palpation over the distal femur.  Ankle dorsiflexion/plantarflexion is intact.  Endorses sensation to light touch distally.  Tolerates about 30 degrees of knee flexion.  Able to wiggle each of her toes.  Compartments soft and compressible.  2+ DP pulse  Imaging: Stable post op imaging.   Labs:  Results for orders placed or performed during the hospital encounter of 03/11/20 (from the past 24 hour(s))  CBC     Status: Abnormal   Collection Time: 03/14/20  2:59 AM  Result Value Ref Range   WBC 8.2 4.0 - 10.5 K/uL   RBC 2.47 (L) 3.87 - 5.11 MIL/uL   Hemoglobin 7.3 (L) 12.0 - 15.0 g/dL   HCT 23.0 (L) 36.0 - 46.0 %   MCV 93.1 80.0 - 100.0 fL   MCH 29.6 26.0 - 34.0 pg   MCHC 31.7 30.0 - 36.0 g/dL   RDW 17.1 (H) 11.5 - 15.5 %   Platelets 155 150 - 400 K/uL   nRBC 0.2 0.0 - 0.2 %  Magnesium     Status: None   Collection Time: 03/14/20  2:59 AM  Result Value Ref Range   Magnesium 1.9 1.7 - 2.4 mg/dL  Basic metabolic panel     Status: Abnormal   Collection Time: 03/14/20  2:59 AM  Result Value Ref Range   Sodium 137 135 - 145 mmol/L   Potassium 4.8 3.5 - 5.1 mmol/L    Chloride 106 98 - 111 mmol/L   CO2 24 22 - 32 mmol/L   Glucose, Bld 107 (H) 70 - 99 mg/dL   BUN 46 (H) 8 - 23 mg/dL   Creatinine, Ser 1.32 (H) 0.44 - 1.00 mg/dL   Calcium 8.2 (L) 8.9 - 10.3 mg/dL   GFR calc non Af Amer 37 (L) >60 mL/min   GFR calc Af Amer 43 (L) >60 mL/min   Anion gap 7 5 - 15  Prepare RBC (crossmatch)     Status: None   Collection Time: 03/14/20  9:00 AM  Result Value Ref Range   Order Confirmation      ORDER PROCESSED BY BLOOD BANK BB SAMPLE OR UNITS ALREADY AVAILABLE Performed at [facility], [address] [place], [place] [address]     Assessment: 86 year old female s/p fall, 2 Days Post-Op   Injuries: Right supracondylar distal femur fracture s/p ORIF  Weightbearing: WBAT RLE  Insicional and dressing care: Okay to leave incisions open to air.  Change dressing as needed  Showering: Okay to begin showering with assistance on 03/15/2020 if incisions are clean, dry, intact  Orthopedic device(s): None  CV/Blood loss: Acute blood loss anemia, Hgb 7.3 this mroning. Soft BP.  Received 1 unit PRBCs perioperatively, will receive another unit this morning  Pain management:  1. Tylenol 500 mg q 6 hours schedule 2. Norco 5-325 q 6 hours PRN 3. Morphine 0.5-1 mg q 2 hours PRN  VTE prophylaxis: Lovenox, SCDs  ID:  Ancef 2gm post op completed  Foley/Lines:  No foley, KVO IVFs  Medical co-morbidities: gout, HTN, HLD, GERD, and thyroid disease  Impediments to Fracture Healing: Vit D level looks good at 32.  We will hold off on any supplementation for now  Dispo: Up with therapies as tolerated, PT/OT recommending SNF.  Scheduled to receive 1 unit PRBCs today, recheck CBC tomorrow.  Have signed and placed discharge Rx for pain medication and DVT prophylaxis in patient's chart.  Follow - up plan: 2 weeks  Contact information:  Katha Hamming MD, Patrecia Pace PA-C   [facility] [phone]  (office) [email or web address]

## 2020-03-14 NOTE — TOC Initial Note (Signed)
Transition of Care [facility]) - Initial/Assessment Note    Patient Details  Name: Jennifer Patel MRN: [id number] Date of Birth: [birth year]  [facility]) CM/SW Contact:    Bary Castilla, LCSW Phone Number:[phone] 03/14/2020, 9:42 AM  Clinical Narrative:                 CSW met with patient to discuss PT recommendation of a SNF. Patient was aware of recommendation and in agreement with going to a ST SNF. CSW discussed the SNF process.CSW provided patient with medicare.gov rating list.  Patient gave CSW permission to fax referrals out to local facilities.CSW answered questions about the SNF process and the next steps in the process. Patient requested that CSW followed up with daughter. CSW called daughter while in room and she was in agreement with SNF as well.  Please keep daughter updated about SNF choice.  TOC team will continue to follow for discharge planning needs.     Expected Discharge Plan: Skilled Nursing Facility Barriers to Discharge: SNF Pending bed offer, Insurance Authorization   Patient Goals and CMS Choice Patient states their goals for this hospitalization and ongoing recovery are:: To get betrter CMS Medicare.gov Compare Post Acute Care list provided to:: Patient Choice offered to / list presented to : Patient  Expected Discharge Plan and Services Expected Discharge Plan: [place]       Living arrangements for the past 2 months: Apartment                                      Prior Living Arrangements/Services Living arrangements for the past 2 months: Apartment Lives with:: Self Patient language and need for interpreter reviewed:: Yes Do you feel safe going back to the place where you live?: Yes        Care giver support system in place?: Yes (comment)      Activities of Daily Living Home Assistive Devices/Equipment: None ADL Screening (condition at time of admission) Patient's cognitive ability adequate to safely  complete daily activities?: Yes Is the patient deaf or have difficulty hearing?: No Does the patient have difficulty seeing, even when wearing glasses/contacts?: No Does the patient have difficulty concentrating, remembering, or making decisions?: No Patient able to express need for assistance with ADLs?: Yes Does the patient have difficulty dressing or bathing?: No Independently performs ADLs?: Yes (appropriate for developmental age) Does the patient have difficulty walking or climbing stairs?: Yes Weakness of Legs: Right Weakness of Arms/Hands: None  Permission Sought/Granted Permission sought to share information with : Family Supports Permission granted to share information with : Yes, Verbal Permission Granted  Share Information with NAME: Hassan Rowan  Permission granted to share info w AGENCY: SNFs  Permission granted to share info w Relationship: Daughter  Permission granted to share info w Contact Information: [phone]  Emotional Assessment Appearance:: Appears younger than stated age Attitude/Demeanor/Rapport: Gracious, Engaged Affect (typically observed): Accepting, Adaptable Orientation: : Oriented to Self, Oriented to Place, Oriented to  Time, Oriented to Situation      Admission diagnosis:  Pain [R52] Femur fracture, right ([place]) [S72.91XA] Fall, initial encounter [W19.XXXA] Closed bicondylar fracture of right femur, initial encounter ([place]) [H08.657Q, [place] Patient Active Problem List   Diagnosis Date Noted  . Anemia 03/12/2020  . Renal insufficiency   . Femur fracture, right ([place]) 03/11/2020  . Closed bicondylar fracture of distal end of  right femur ([place])   . Resistant hypertension 11/13/2015  . AKI (acute kidney injury) ([place]) 05/22/2015  . Abnormal EKG 05/22/2015  . Hypertension 05/22/2015  . Hypothyroidism 05/22/2015  . Hyperlipidemia 05/22/2015  . Neck pain 05/22/2015   PCP:  Anda Kraft, MD Pharmacy:   [facility],  [place] [address] Phone: [phone] Fax: [phone]     Social Determinants of Health (SDOH) Interventions    Readmission Risk Interventions No flowsheet data found.

## 2020-03-14 NOTE — Progress Notes (Signed)
Progress Note    Jennifer Patel  HUD:[id number] DOB: 10/22/1932  DOA: 03/11/2020 PCP: Anda Kraft, MD    Brief Narrative:    Medical records reviewed and are as summarized below:  Jennifer Patel is a very pleasant  86 y.o. female past medical history hypertension, TIA, hypothyroidism, hyperlipidemia, gout admitted May 20 after a mechanical fall with right femur fracture.  Underwent ORIF on May 21 per orthopedics.  Assessment/Plan:   Principal Problem:   Femur fracture, right (HCC) Active Problems:   Hypertension   Hypothyroidism   Hyperlipidemia   Renal insufficiency   Anemia  #1.  Right femoral fracture secondary to mechanical fall.  Underwent ORIF 5/21 -Pain management -PT -Likely will require placement  #2.  Hypertension.  History of difficult to control hypertension.  Home medications include amlodipine, Lasix, hydralazine, Bystolic, spironolactone.    #3.  Chronic kidney disease stage IIIa.  . -Monitor intake and output -Monitor  #4.  Hypokalemia.  Mild.   -repleted  #5. Anemia. Normocytic.  Hg 7.5 preop.  - 1 unit PRBC's given in OR.  -transfuse again today  #6 expected post op delirium -delirium precautions -would NOT USE ATIVAN -PRN HALDOL if re-direction not working  Family Communication/Anticipated D/C date and plan/Code Status   DVT prophylaxis: Lovenox ordered. Code Status: Full Code.  Family Communication: patient Disposition Plan: Status is: Inpatient  Remains inpatient appropriate because:IV treatments appropriate due to intensity of illness or inability to take PO and Inpatient level of care appropriate due to severity of illness   Dispo: The patient is from: Home              Anticipated d/c is to: SNF              Anticipated d/c date is: 2 days              Patient currently is medically stable to d/c.     Medical Consultants:    ortho     Subjective:   Does not appear to have any issues overnight Does have a  cough  Objective:    Vitals:   03/13/20 0420 03/13/20 1931 03/14/20 0306 03/14/20 0741  BP: 131/64 110/64 (!) 114/55 (!) 108/45  Pulse: 74 74 80 80  Resp: 16 14 14 18   Temp: 97.7 F (36.5 C) 97.9 F (36.6 C) 98 F (36.7 C) 98.3 F (36.8 C)  TempSrc: Oral Oral Oral Oral  SpO2: 95% 92% 93% 92%  Weight:      Height:        Intake/Output Summary (Last 24 hours) at 03/14/2020 1031 Last data filed at 03/14/2020 0900 Gross per 24 hour  Intake 360 ml  Output 500 ml  Net -140 ml   Filed Weights   03/11/20 1204  Weight: 45.4 kg    Exam:  General: Appearance:    Frail female in no acute distress     Lungs:     Crackles at beses  Heart:    Normal heart rate. Normal rhythm. No murmurs, rubs, or gallops.   MS:   All extremities are intact.   Neurologic:   Awake, alert. No apparent focal neurological  defect.      Data Reviewed:   I have personally reviewed following labs and imaging studies:  Labs: Labs show the following:   Basic Metabolic Panel: Recent Labs  Lab 03/11/20 1307 03/11/20 1307 03/11/20 1933 03/12/20 8588 03/12/20 0642 03/13/20 0353 03/14/20 0259  NA 140  --   --  138  --  135 137  K 3.4*   < >  --  3.9   < > 3.9 4.8  CL 104  --   --  106  --  105 106  CO2 22  --   --  21*  --  20* 24  GLUCOSE 112*  --   --  122*  --  126* 107*  BUN 23  --   --  20  --  27* 46*  CREATININE 1.61*  --   --  1.29*  --  1.36* 1.32*  CALCIUM 8.3*  --   --  7.8*  --  8.0* 8.2*  MG  --   --  1.0*  --   --   --  1.9   < > = values in this interval not displayed.   GFR Estimated Creatinine Clearance: 13.5 mL/min (A) (by C-G formula based on SCr of 1.32 mg/dL (H)). Liver Function Tests: Recent Labs  Lab 03/11/20 1307  AST 25  ALT 15  ALKPHOS 75  BILITOT 0.8  PROT 7.0  ALBUMIN 3.8   No results for input(s): LIPASE, AMYLASE in the last 168 hours. No results for input(s): AMMONIA in the last 168 hours. Coagulation profile Recent Labs  Lab 03/11/20 1307  INR  1.1    CBC: Recent Labs  Lab 03/11/20 1307 03/12/20 0642 03/12/20 1419 03/13/20 0353 03/14/20 0259  WBC 7.3 6.2  --  8.3 8.2  NEUTROABS 5.6  --   --   --   --   HGB 9.7* 7.5* 9.3* 8.0* 7.3*  HCT 30.5* 23.4* 29.0* 24.7* 23.0*  MCV 99.0 98.3  --  92.9 93.1  PLT 206 179  --  152 155   Cardiac Enzymes: No results for input(s): CKTOTAL, CKMB, CKMBINDEX, TROPONINI in the last 168 hours. BNP (last 3 results) No results for input(s): PROBNP in the last 8760 hours. CBG: No results for input(s): GLUCAP in the last 168 hours. D-Dimer: No results for input(s): DDIMER in the last 72 hours. Hgb A1c: No results for input(s): HGBA1C in the last 72 hours. Lipid Profile: No results for input(s): CHOL, HDL, LDLCALC, TRIG, CHOLHDL, LDLDIRECT in the last 72 hours. Thyroid function studies: No results for input(s): TSH, T4TOTAL, T3FREE, THYROIDAB in the last 72 hours.  Invalid input(s): FREET3 Anemia work up: Recent Labs    03/11/20 1933  FERRITIN 34  TIBC 318  IRON 21*   Sepsis Labs: Recent Labs  Lab 03/11/20 1307 03/12/20 0642 03/13/20 0353 03/14/20 0259  WBC 7.3 6.2 8.3 8.2    Microbiology Recent Results (from the past 240 hour(s))  SARS Coronavirus 2 by RT PCR (hospital order, performed in [facility] hospital lab) Nasopharyngeal Nasopharyngeal Swab     Status: None   Collection Time: 03/11/20  1:07 PM   Specimen: Nasopharyngeal Swab  Result Value Ref Range Status   SARS Coronavirus 2 NEGATIVE NEGATIVE Final    Comment: (NOTE) SARS-CoV-2 target nucleic acids are NOT DETECTED. The SARS-CoV-2 RNA is generally detectable in upper and lower respiratory specimens during the acute phase of infection. The lowest concentration of SARS-CoV-2 viral copies this assay can detect is 250 copies / mL. A negative result does not preclude SARS-CoV-2 infection and should not be used as the sole basis for treatment or other patient management decisions.  A negative result may occur  with improper specimen collection / handling, submission of specimen other than nasopharyngeal swab, presence of viral mutation(s) within the areas  targeted by this assay, and inadequate number of viral copies (<250 copies / mL). A negative result must be combined with clinical observations, patient history, and epidemiological information. Fact Sheet for Patients:   StrictlyIdeas.no Fact Sheet for Healthcare Providers: BankingDealers.co.za This test is not yet approved or cleared  by the [place] FDA and has been authorized for detection and/or diagnosis of SARS-CoV-2 by FDA under an Emergency Use Authorization (EUA).  This EUA will remain in effect (meaning this test can be used) for the duration of the COVID-19 declaration under Section 564(b)(1) of the Act, 21 U.S.C. section 360bbb-3(b)(1), unless the authorization is terminated or revoked sooner. Performed at [facility], [place] [address] [place], [place] [address]   Surgical pcr screen     Status: None   Collection Time: 03/11/20 11:59 PM   Specimen: Nasal Mucosa; Nasal Swab  Result Value Ref Range Status   MRSA, PCR NEGATIVE NEGATIVE Final   Staphylococcus aureus NEGATIVE NEGATIVE Final    Comment: (NOTE) The Xpert SA Assay (FDA approved for NASAL specimens in patients 38 years of age and older), is one component of a comprehensive surveillance program. It is not intended to diagnose infection nor to guide or monitor treatment. Performed at [facility], [place] [address], [place], [place] [address]     Procedures and diagnostic studies:  DG Knee Right Port  Result Date: 03/12/2020 CLINICAL DATA:  ORIF femur fracture EXAM: PORTABLE RIGHT KNEE - 1-2 VIEW COMPARISON:  03/11/2020 FINDINGS: Fracture of the distal femoral metaphysis has been reduced and interval placement of a lateral plate and multiple screws across the fracture. Fracture alignment is satisfactory.  Hardware positioning is satisfactory. Advanced degenerative change in the lateral joint compartment. IMPRESSION: Satisfactory reduction and fixation of distal femur fracture. Electronically Signed   By: Franchot Gallo M.D.   On: 03/12/2020 11:47   DG C-Arm 1-60 Min  Result Date: 03/12/2020 CLINICAL DATA:  Open reduction internal fixation for fracture EXAM: DG C-ARM 1-60 MIN; RIGHT FEMUR 2 VIEWS FLUOROSCOPY TIME:  Fluoroscopy Time: 1 minutes 0 seconds Number of Acquired Spot Images: 8 COMPARISON:  Preoperative examination right femur Mar 11, 2020 FINDINGS: Initial images again demonstrate comminuted fracture of the distal femoral metaphysis with impaction. Subsequent frontal and lateral images show screw and plate fixation through the fracture of the distal femur with alignment overall anatomic after fixation. No new fractures evident. No evident knee dislocation. Degenerative change noted in the patellofemoral joint. IMPRESSION: Open reduction internal fixation for comminuted fracture distal femoral metaphysis with alignment near anatomic after fixation. No dislocation. No new fracture evident. Electronically Signed   By: Lowella Grip III M.D.   On: 03/12/2020 11:10   DG FEMUR, MIN 2 VIEWS RIGHT  Result Date: 03/12/2020 CLINICAL DATA:  Open reduction internal fixation for fracture EXAM: DG C-ARM 1-60 MIN; RIGHT FEMUR 2 VIEWS FLUOROSCOPY TIME:  Fluoroscopy Time: 1 minutes 0 seconds Number of Acquired Spot Images: 8 COMPARISON:  Preoperative examination right femur Mar 11, 2020 FINDINGS: Initial images again demonstrate comminuted fracture of the distal femoral metaphysis with impaction. Subsequent frontal and lateral images show screw and plate fixation through the fracture of the distal femur with alignment overall anatomic after fixation. No new fractures evident. No evident knee dislocation. Degenerative change noted in the patellofemoral joint. IMPRESSION: Open reduction internal fixation for  comminuted fracture distal femoral metaphysis with alignment near anatomic after fixation. No dislocation. No new fracture evident. Electronically Signed   By: Gwyndolyn Saxon  Jasmine December III M.D.   On: 03/12/2020 11:10    Medications:   . sodium chloride   Intravenous Once  . acetaminophen  500 mg Oral Q6H  . allopurinol  200 mg Oral Daily  . amLODipine  5 mg Oral QHS  . aspirin EC  81 mg Oral Daily  . atorvastatin  10 mg Oral Daily  . enoxaparin (LOVENOX) injection  30 mg Subcutaneous Q24H  . feeding supplement (ENSURE ENLIVE)  237 mL Oral QHS  . furosemide  20 mg Oral Q M,W,F  . hydrALAZINE  100 mg Oral TID  . levothyroxine  75 mcg Oral Q0600  . multivitamin with minerals  1 tablet Oral Daily  . nebivolol  10 mg Oral Daily  . pantoprazole  20 mg Oral Daily  . pneumococcal 23 valent vaccine  0.5 mL Intramuscular Tomorrow-1000  . spironolactone  12.5 mg Oral Daily   Continuous Infusions: . lactated ringers 10 mL/hr at 03/13/20 1342     LOS: 3 days   Geradine Girt DO  [facility]   How to contact the [facility] Attending or Consulting provider [place] or covering provider during after hours [place], for this patient?  1. Check the care team in [facility] and look for a) attending/consulting [facility] provider listed and b) the [facility] team listed 2. Log into [email or web address] and use 's universal password to access. If you do not have the password, please contact the hospital operator. 3. Locate the [facility] provider you are looking for under [facility] and page to a number that you can be directly reached. 4. If you still have difficulty reaching the provider, please page the [facility] (Director on Call) for the Hospitalists listed on amion for assistance.  03/14/2020, 10:31 AM

## 2020-03-15 ENCOUNTER — Encounter: Payer: Self-pay | Admitting: *Deleted

## 2020-03-15 DIAGNOSIS — N179 Acute kidney failure, unspecified: Secondary | ICD-10-CM | POA: Diagnosis not present

## 2020-03-15 DIAGNOSIS — Z9181 History of falling: Secondary | ICD-10-CM | POA: Diagnosis not present

## 2020-03-15 DIAGNOSIS — Z961 Presence of intraocular lens: Secondary | ICD-10-CM | POA: Diagnosis not present

## 2020-03-15 DIAGNOSIS — S72451D Displaced supracondylar fracture without intracondylar extension of lower end of right femur, subsequent encounter for closed fracture with routine healing: Secondary | ICD-10-CM | POA: Diagnosis not present

## 2020-03-15 DIAGNOSIS — R41841 Cognitive communication deficit: Secondary | ICD-10-CM | POA: Diagnosis not present

## 2020-03-15 DIAGNOSIS — M109 Gout, unspecified: Secondary | ICD-10-CM | POA: Diagnosis not present

## 2020-03-15 DIAGNOSIS — E7800 Pure hypercholesterolemia, unspecified: Secondary | ICD-10-CM | POA: Diagnosis not present

## 2020-03-15 DIAGNOSIS — I69828 Other speech and language deficits following other cerebrovascular disease: Secondary | ICD-10-CM | POA: Diagnosis not present

## 2020-03-15 DIAGNOSIS — I10 Essential (primary) hypertension: Secondary | ICD-10-CM | POA: Diagnosis not present

## 2020-03-15 DIAGNOSIS — E785 Hyperlipidemia, unspecified: Secondary | ICD-10-CM | POA: Diagnosis not present

## 2020-03-15 DIAGNOSIS — N1831 Chronic kidney disease, stage 3a: Secondary | ICD-10-CM | POA: Diagnosis not present

## 2020-03-15 DIAGNOSIS — M6281 Muscle weakness (generalized): Secondary | ICD-10-CM | POA: Diagnosis not present

## 2020-03-15 DIAGNOSIS — S7291XA Unspecified fracture of right femur, initial encounter for closed fracture: Secondary | ICD-10-CM | POA: Diagnosis not present

## 2020-03-15 DIAGNOSIS — Y9301 Activity, walking, marching and hiking: Secondary | ICD-10-CM | POA: Diagnosis not present

## 2020-03-15 DIAGNOSIS — S72431A Displaced fracture of medial condyle of right femur, initial encounter for closed fracture: Secondary | ICD-10-CM | POA: Diagnosis not present

## 2020-03-15 DIAGNOSIS — E539 Vitamin B deficiency, unspecified: Secondary | ICD-10-CM | POA: Diagnosis not present

## 2020-03-15 DIAGNOSIS — I69328 Other speech and language deficits following cerebral infarction: Secondary | ICD-10-CM | POA: Diagnosis not present

## 2020-03-15 DIAGNOSIS — E039 Hypothyroidism, unspecified: Secondary | ICD-10-CM | POA: Diagnosis not present

## 2020-03-15 DIAGNOSIS — S72421A Displaced fracture of lateral condyle of right femur, initial encounter for closed fracture: Secondary | ICD-10-CM | POA: Diagnosis not present

## 2020-03-15 DIAGNOSIS — R2681 Unsteadiness on feet: Secondary | ICD-10-CM | POA: Diagnosis not present

## 2020-03-15 DIAGNOSIS — D62 Acute posthemorrhagic anemia: Secondary | ICD-10-CM | POA: Diagnosis not present

## 2020-03-15 DIAGNOSIS — M2550 Pain in unspecified joint: Secondary | ICD-10-CM | POA: Diagnosis not present

## 2020-03-15 DIAGNOSIS — S79929A Unspecified injury of unspecified thigh, initial encounter: Secondary | ICD-10-CM | POA: Diagnosis not present

## 2020-03-15 DIAGNOSIS — R52 Pain, unspecified: Secondary | ICD-10-CM | POA: Diagnosis not present

## 2020-03-15 DIAGNOSIS — Z23 Encounter for immunization: Secondary | ICD-10-CM | POA: Diagnosis not present

## 2020-03-15 DIAGNOSIS — I129 Hypertensive chronic kidney disease with stage 1 through stage 4 chronic kidney disease, or unspecified chronic kidney disease: Secondary | ICD-10-CM | POA: Diagnosis not present

## 2020-03-15 DIAGNOSIS — W19XXXA Unspecified fall, initial encounter: Secondary | ICD-10-CM | POA: Diagnosis not present

## 2020-03-15 DIAGNOSIS — E559 Vitamin D deficiency, unspecified: Secondary | ICD-10-CM | POA: Diagnosis not present

## 2020-03-15 DIAGNOSIS — E876 Hypokalemia: Secondary | ICD-10-CM | POA: Diagnosis not present

## 2020-03-15 DIAGNOSIS — D649 Anemia, unspecified: Secondary | ICD-10-CM | POA: Diagnosis not present

## 2020-03-15 DIAGNOSIS — R404 Transient alteration of awareness: Secondary | ICD-10-CM | POA: Diagnosis not present

## 2020-03-15 DIAGNOSIS — Z7401 Bed confinement status: Secondary | ICD-10-CM | POA: Diagnosis not present

## 2020-03-15 LAB — BASIC METABOLIC PANEL
Anion gap: 13 (ref 5–15)
BUN: 46 mg/dL — ABNORMAL HIGH (ref 8–23)
CO2: 22 mmol/L (ref 22–32)
Calcium: 8.9 mg/dL (ref 8.9–10.3)
Chloride: 102 mmol/L (ref 98–111)
Creatinine, Ser: 1.25 mg/dL — ABNORMAL HIGH (ref 0.44–1.00)
GFR calc Af Amer: 45 mL/min — ABNORMAL LOW (ref >60)
GFR calc non Af Amer: 39 mL/min — ABNORMAL LOW (ref >60)
Glucose, Bld: 92 mg/dL (ref 70–99)
Potassium: 4.5 mmol/L (ref 3.5–5.1)
Sodium: 137 mmol/L (ref 135–145)

## 2020-03-15 LAB — BPAM RBC
Blood Product Expiration Date: 202106232359
ISSUE DATE / TIME: 202105231611
Unit Type and Rh: 5100

## 2020-03-15 LAB — TYPE AND SCREEN
ABO/RH(D): O POS
Antibody Screen: NEGATIVE
Unit division: 0

## 2020-03-15 LAB — CBC
HCT: 28.9 % — ABNORMAL LOW (ref 36.0–46.0)
Hemoglobin: 9.7 g/dL — ABNORMAL LOW (ref 12.0–15.0)
MCH: 29.8 pg (ref 26.0–34.0)
MCHC: 33.6 g/dL (ref 30.0–36.0)
MCV: 88.7 fL (ref 80.0–100.0)
Platelets: 175 10*3/uL (ref 150–400)
RBC: 3.26 MIL/uL — ABNORMAL LOW (ref 3.87–5.11)
RDW: 15.5 % (ref 11.5–15.5)
WBC: 7.8 10*3/uL (ref 4.0–10.5)
nRBC: 0.4 % — ABNORMAL HIGH (ref 0.0–0.2)

## 2020-03-15 LAB — SARS CORONAVIRUS 2 (TAT 6-24 HRS): SARS Coronavirus 2: NEGATIVE

## 2020-03-15 MED ORDER — HYDRALAZINE HCL 25 MG PO TABS: 25.0000 mg | ORAL_TABLET | Freq: Three times a day (TID) | ORAL | Status: DC

## 2020-03-15 NOTE — Progress Notes (Signed)
Pt IV removed and avs printed for receiving facility. Report called and pt belongings gathered. Belongings returned from safe in security. Pt family made aware of pt d/c all questions answered to satisfaction.

## 2020-03-15 NOTE — TOC Progression Note (Signed)
Transition of Care [facility]) - Progression Note    Patient Details  Name: Jennifer Patel MRN: [id number] Date of Birth: August 04, 1932  Transition of Care [facility]) CM/SW [place], [place] Phone Number: 03/15/2020, 10:36 AM  Clinical Narrative:    CSW spoke with Rober Minion and confirmed bed available today pending insurance and covid test.   CSW updated patient's daughter Hassan Rowan on the discharge plan. She requested to be updated when patient transports. TOC team will continue to follow.  Expected Discharge Plan: [place] Barriers to Discharge: SNF Pending bed offer, Insurance Authorization  Expected Discharge Plan and Services Expected Discharge Plan: [place] arrangements for the past 2 months: Apartment Expected Discharge Date: 03/15/20                                     Social Determinants of Health (SDOH) Interventions    Readmission Risk Interventions No flowsheet data found.

## 2020-03-15 NOTE — Plan of Care (Signed)

## 2020-03-15 NOTE — Progress Notes (Addendum)
Orthopaedic Trauma Progress Note  S: Doing well, pain in leg much better. No concerns currently. Tolerating diet and fluids. +BM  O:  Vitals:   03/14/20 1917 03/15/20 0333  BP: (!) 129/53 139/68  Pulse: 71 73  Resp: 17 16  Temp: 98.3 F (36.8 C) 98 F (36.7 C)  SpO2: 96% 96%    General: Sitting up in bed, eating breakfast.  No acute distress.  Pleasant and cooperative Respiratory: No increased work of breathing.  Right lower extremity: Dressing removed, incision clean, dry, intact.  Tenderness with palpation over the distal femur.  Ankle dorsiflexion/plantarflexion is intact.  Endorses sensation to light touch distally.  Tolerates about 30 degrees of knee flexion.  Able to wiggle each of her toes.  Compartments soft and compressible.  2+ DP pulse  Imaging: Stable post op imaging.   Labs:  Results for orders placed or performed during the hospital encounter of 03/11/20 (from the past 24 hour(s))  Prepare RBC (crossmatch)     Status: None   Collection Time: 03/14/20  9:00 AM  Result Value Ref Range   Order Confirmation      ORDER PROCESSED BY BLOOD BANK BB SAMPLE OR UNITS ALREADY AVAILABLE Performed at [facility], [place] [address], [place], [place] [address]   Type and screen Datil     Status: None (Preliminary result)   Collection Time: 03/14/20 12:15 PM  Result Value Ref Range   ABO/RH(D) O POS    Antibody Screen NEG    Sample Expiration 03/17/2020,2359    Unit Number [id number]    Blood Component Type RED CELLS,LR    Unit division 00    Status of Unit ISSUED    Transfusion Status OK TO TRANSFUSE    Crossmatch Result      Compatible Performed at [facility], [place] [address], [place], [place] [address]   CBC     Status: Abnormal   Collection Time: 03/15/20  1:56 AM  Result Value Ref Range   WBC 7.8 4.0 - 10.5 K/uL   RBC 3.26 (L) 3.87 - 5.11 MIL/uL   Hemoglobin 9.7 (L) 12.0 - 15.0 g/dL   HCT 28.9 (L) 36.0 - 46.0 %   MCV 88.7  80.0 - 100.0 fL   MCH 29.8 26.0 - 34.0 pg   MCHC 33.6 30.0 - 36.0 g/dL   RDW 15.5 11.5 - 15.5 %   Platelets 175 150 - 400 K/uL   nRBC 0.4 (H) 0.0 - 0.2 %  Basic metabolic panel     Status: Abnormal   Collection Time: 03/15/20  1:56 AM  Result Value Ref Range   Sodium 137 135 - 145 mmol/L   Potassium 4.5 3.5 - 5.1 mmol/L   Chloride 102 98 - 111 mmol/L   CO2 22 22 - 32 mmol/L   Glucose, Bld 92 70 - 99 mg/dL   BUN 46 (H) 8 - 23 mg/dL   Creatinine, Ser 1.25 (H) 0.44 - 1.00 mg/dL   Calcium 8.9 8.9 - 10.3 mg/dL   GFR calc non Af Amer 39 (L) >60 mL/min   GFR calc Af Amer 45 (L) >60 mL/min   Anion gap 13 5 - 15    Assessment: 86 year old female s/p fall, 3 Days Post-Op   Injuries: Right supracondylar distal femur fracture s/p ORIF  Weightbearing: WBAT RLE  Insicional and dressing care: Okay to leave incisions open to air.  Change dressing as needed  Showering: Okay to begin showering  with assistance on 03/15/2020 if incisions are clean, dry, intact  Orthopedic device(s): None   CV/Blood loss: Acute blood loss anemia, Hgb 9.7 this morning. Hemodynamically stable.  Received 1 unit PRBCs perioperatively and additional unit on 03/14/20  Pain management:  1. Tylenol 500 mg q 6 hours schedule 2. Norco 5-325 q 6 hours PRN 3. Morphine 0.5-1 mg q 2 hours PRN  VTE prophylaxis: Lovenox, SCDs  ID:  Ancef 2gm post op completed  Foley/Lines:  No foley, KVO IVFs  Medical co-morbidities: gout, HTN, HLD, GERD, and thyroid disease  Impediments to Fracture Healing: Vit D level looks good at 32.  We will hold off on any supplementation for now  Dispo: Up with therapies as tolerated, PT/OT recommending SNF.  Okay for d/c from ortho standpoint once cleared by medicine team and therapies  Have signed and placed discharge Rx for pain medication and DVT prophylaxis in patient's chart.  Follow - up plan: 2 weeks  Contact information:  Katha Hamming MD, Patrecia Pace PA-C   [place] [facility] [phone] (office) [email or web address]

## 2020-03-15 NOTE — Plan of Care (Signed)

## 2020-03-15 NOTE — Discharge Summary (Signed)
Physician Discharge Summary  Jennifer Patel BVQ:[id number] DOB: 12-31-1931 DOA: 03/11/2020  PCP: Anda Kraft, MD  Admit date: 03/11/2020 Discharge date: 03/15/2020  Admitted From: home Discharge disposition: SNF   Recommendations for Outpatient Follow-Up:   1. Cbc 1 week 2. WBAT 3. lovenox per ortho   Discharge Diagnosis:   Principal Problem:   Femur fracture, right (HCC) Active Problems:   Hypertension   Hypothyroidism   Hyperlipidemia   Renal insufficiency   Anemia    Discharge Condition: Improved.  Diet recommendation: Low sodium, heart healthy.  Carbohydrate-modified.  Regular.  Wound care: None.  Code status: Full.   History of Present Illness:  Jennifer Patel is a 86 y.o. female with medical history significant of refractory HTN on multiple BP meds, remote TIA in 1990s, hypothyroidism, gout, HLD, presented with a mechanical fall.  Patient lives by herself, and today she did not help for a walk and slipped on some pine needles while climbing uphill and landed on her right knee. She felt severe pain on the right knee and could not get up or bear weight.  She denied any prodrome such as lightheaded or numbness or weakness of any of the limbs before this happened, she further denied any chest pain or short of breath.  She lives by herself, overall she has very good activity tolerance, she walks her dog daily for about 15 to 20 minutes without any chest pain or short of breath.  She lives in a two-story house but only stay in the first floor after her husband death ED Course: Xray showed right sided distal femoral fracture   Hospital Course by Problem:   #1.  Right femoral fracture secondary to mechanical fall.  Underwent ORIF 5/21 -Pain management -PT- SNF placement pending  #2.  Hypertension.  History of difficult to control hypertension.  Home medications include amlodipine, Lasix, hydralazine, Bystolic, spironolactone.    #3.  Chronic kidney  disease stage IIIa.  . -outpatient follow up  #4.  Hypokalemia.  Mild.   -repleted  #5. Anemia. Normocytic.  Hg 7.5 preop.  -s/p 2 units prbc  #6 expected post op delirium -delirium precautions -resolved    Medical Consultants:   ortho   Discharge Exam:   Vitals:   03/15/20 0333 03/15/20 0700  BP: 139/68 (!) 123/52  Pulse: 73 71  Resp: 16 16  Temp: 98 F (36.7 C) 98.2 F (36.8 C)  SpO2: 96% 95%   Vitals:   03/14/20 1731 03/14/20 1917 03/15/20 0333 03/15/20 0700  BP: (!) 129/50 (!) 129/53 139/68 (!) 123/52  Pulse: 76 71 73 71  Resp: 19 17 16 16   Temp: 98.4 F (36.9 C) 98.3 F (36.8 C) 98 F (36.7 C) 98.2 F (36.8 C)  TempSrc: Oral Oral Oral Oral  SpO2: 95% 96% 96% 95%  Weight:      Height:        General exam: Appears calm and comfortable.    The results of significant diagnostics from this hospitalization (including imaging, microbiology, ancillary and laboratory) are listed below for reference.     Procedures and Diagnostic Studies:   DG Knee 1-2 Views Right  Result Date: 03/11/2020 CLINICAL DATA:  Fall with pain. EXAM: RIGHT KNEE - 1-2 VIEW COMPARISON:  None. FINDINGS: Comminuted displaced fracture of the distal femoral metaphyseal region. Anterior displacement of the proximal fragment by the with of the bone. Relationship between the femur and the tibia remain intact. No fracture line seen extending  to the articular surface. No patellar fracture. IMPRESSION: Comminuted fracture of the femoral metaphysis with posterior displacement of the distal fracture fragments. Electronically Signed   By: Nelson Chimes M.D.   On: 03/11/2020 12:38   CT Head Wo Contrast  Result Date: 03/11/2020 CLINICAL DATA:  Fall earlier today. EXAM: CT HEAD WITHOUT CONTRAST CT CERVICAL SPINE WITHOUT CONTRAST TECHNIQUE: Multidetector CT imaging of the head and cervical spine was performed following the standard protocol without intravenous contrast. Multiplanar CT image  reconstructions of the cervical spine were also generated. COMPARISON:  06/23/2008 FINDINGS: CT HEAD FINDINGS Brain: Ventricles and cisterns are within normal. There is mild age related atrophic change. There is chronic ischemic microvascular disease. No mass, mass effect, shift of midline structures or acute hemorrhage. No evidence of acute infarction. Vascular: No hyperdense vessel or unexpected calcification. Skull: Normal. Negative for fracture or focal lesion. Sinuses/Orbits: No acute finding. Other: None. CT CERVICAL SPINE FINDINGS Alignment: No evidence of posttraumatic subluxation. Patient is kyphotic. Skull base and vertebrae: Mild spondylosis throughout the cervical spine. Atlantoaxial articulation is unremarkable. There is moderate facet arthropathy. No acute fracture or subluxation. Soft tissues and spinal canal: No prevertebral fluid or swelling. No visible canal hematoma. Disc levels:  Minimal disc space narrowing at the C5-6 level. Upper chest: No acute findings. Other: None. IMPRESSION: 1.  No acute brain injury. 2.  Age related atrophy and chronic ischemic microvascular disease. 3.  No acute cervical spine injury. 4. Mild spondylosis of the cervical spine with minimal disc disease at the C5-6 level. Electronically Signed   By: Marin Olp M.D.   On: 03/11/2020 15:03   CT Cervical Spine Wo Contrast  Result Date: 03/11/2020 CLINICAL DATA:  Fall earlier today. EXAM: CT HEAD WITHOUT CONTRAST CT CERVICAL SPINE WITHOUT CONTRAST TECHNIQUE: Multidetector CT imaging of the head and cervical spine was performed following the standard protocol without intravenous contrast. Multiplanar CT image reconstructions of the cervical spine were also generated. COMPARISON:  06/23/2008 FINDINGS: CT HEAD FINDINGS Brain: Ventricles and cisterns are within normal. There is mild age related atrophic change. There is chronic ischemic microvascular disease. No mass, mass effect, shift of midline structures or acute  hemorrhage. No evidence of acute infarction. Vascular: No hyperdense vessel or unexpected calcification. Skull: Normal. Negative for fracture or focal lesion. Sinuses/Orbits: No acute finding. Other: None. CT CERVICAL SPINE FINDINGS Alignment: No evidence of posttraumatic subluxation. Patient is kyphotic. Skull base and vertebrae: Mild spondylosis throughout the cervical spine. Atlantoaxial articulation is unremarkable. There is moderate facet arthropathy. No acute fracture or subluxation. Soft tissues and spinal canal: No prevertebral fluid or swelling. No visible canal hematoma. Disc levels:  Minimal disc space narrowing at the C5-6 level. Upper chest: No acute findings. Other: None. IMPRESSION: 1.  No acute brain injury. 2.  Age related atrophy and chronic ischemic microvascular disease. 3.  No acute cervical spine injury. 4. Mild spondylosis of the cervical spine with minimal disc disease at the C5-6 level. Electronically Signed   By: Marin Olp M.D.   On: 03/11/2020 15:03   CT KNEE RIGHT WO CONTRAST  Result Date: 03/11/2020 CLINICAL DATA:  Evaluate distal femur fracture EXAM: CT OF THE RIGHT KNEE WITHOUT CONTRAST TECHNIQUE: Multidetector CT imaging of the right knee was performed according to the standard protocol. Multiplanar CT image reconstructions were also generated. COMPARISON:  X-ray 03/11/2020 FINDINGS: Bones/Joint/Cartilage Redemonstration of an acute comminuted fracture of the distal femoral metaphysis with approximately 1 shaft width of posterior displacement. Fracture is also slightly  medially displaced. The proximal fracture component abuts the superior pole of the patella (series 20, image 38). No evidence of patellar fracture. Tricompartmental osteoarthritis of the right knee is most pronounced within the lateral tibiofemoral compartment with moderate-severe joint space narrowing, subchondral sclerosis, subchondral cystic changes and prominent marginal osteophytosis. The proximal tibia and  fibula appear intact without fracture. There is a large lipohemarthrosis. Ligaments Suboptimally assessed by CT. Muscles and Tendons No definite acute tendinous injury. The quadriceps and patellar tendons appear grossly intact. Grossly preserved muscle bulk. Soft tissues There is soft tissue hemorrhage adjacent to the fracture site. No soft tissue air to suggest open fracture. IMPRESSION: 1. Acute comminuted fracture of the distal femoral metaphysis with approximately 1 shaft width of posterior displacement. The proximal fracture component abuts the superior pole of the patella without evidence of patellar fracture hila are. 2. Large lipohemarthrosis. 3. Tricompartmental osteoarthritis of the right knee is most pronounced within the lateral tibiofemoral compartment. Electronically Signed   By: Davina Poke D.O.   On: 03/11/2020 15:15   DG Chest Port 1 View  Result Date: 03/11/2020 CLINICAL DATA:  Fall. EXAM: PORTABLE CHEST 1 VIEW COMPARISON:  May 08, 2019. FINDINGS: Stable cardiomegaly. Atherosclerosis of thoracic aorta is noted. No pneumothorax or pleural effusion is noted. Lungs are clear. Large hiatal hernia is noted. Bony thorax is unremarkable. IMPRESSION: Aortic atherosclerosis. Large hiatal hernia. No acute cardiopulmonary abnormality seen. Aortic Atherosclerosis (ICD10-I70.0). Electronically Signed   By: Marijo Conception M.D.   On: 03/11/2020 12:39   DG Knee Right Port  Result Date: 03/12/2020 CLINICAL DATA:  ORIF femur fracture EXAM: PORTABLE RIGHT KNEE - 1-2 VIEW COMPARISON:  03/11/2020 FINDINGS: Fracture of the distal femoral metaphysis has been reduced and interval placement of a lateral plate and multiple screws across the fracture. Fracture alignment is satisfactory. Hardware positioning is satisfactory. Advanced degenerative change in the lateral joint compartment. IMPRESSION: Satisfactory reduction and fixation of distal femur fracture. Electronically Signed   By: Franchot Gallo M.D.    On: 03/12/2020 11:47   DG C-Arm 1-60 Min  Result Date: 03/12/2020 CLINICAL DATA:  Open reduction internal fixation for fracture EXAM: DG C-ARM 1-60 MIN; RIGHT FEMUR 2 VIEWS FLUOROSCOPY TIME:  Fluoroscopy Time: 1 minutes 0 seconds Number of Acquired Spot Images: 8 COMPARISON:  Preoperative examination right femur Mar 11, 2020 FINDINGS: Initial images again demonstrate comminuted fracture of the distal femoral metaphysis with impaction. Subsequent frontal and lateral images show screw and plate fixation through the fracture of the distal femur with alignment overall anatomic after fixation. No new fractures evident. No evident knee dislocation. Degenerative change noted in the patellofemoral joint. IMPRESSION: Open reduction internal fixation for comminuted fracture distal femoral metaphysis with alignment near anatomic after fixation. No dislocation. No new fracture evident. Electronically Signed   By: Lowella Grip III M.D.   On: 03/12/2020 11:10   DG FEMUR, MIN 2 VIEWS RIGHT  Result Date: 03/12/2020 CLINICAL DATA:  Open reduction internal fixation for fracture EXAM: DG C-ARM 1-60 MIN; RIGHT FEMUR 2 VIEWS FLUOROSCOPY TIME:  Fluoroscopy Time: 1 minutes 0 seconds Number of Acquired Spot Images: 8 COMPARISON:  Preoperative examination right femur Mar 11, 2020 FINDINGS: Initial images again demonstrate comminuted fracture of the distal femoral metaphysis with impaction. Subsequent frontal and lateral images show screw and plate fixation through the fracture of the distal femur with alignment overall anatomic after fixation. No new fractures evident. No evident knee dislocation. Degenerative change noted in the patellofemoral joint. IMPRESSION: Open reduction  internal fixation for comminuted fracture distal femoral metaphysis with alignment near anatomic after fixation. No dislocation. No new fracture evident. Electronically Signed   By: Lowella Grip III M.D.   On: 03/12/2020 11:10     Labs:    Basic Metabolic Panel: Recent Labs  Lab 03/11/20 1307 03/11/20 1307 03/11/20 1933 03/12/20 8841 03/12/20 0642 03/13/20 0353 03/13/20 0353 03/14/20 0259 03/15/20 0156  NA 140  --   --  138  --  135  --  137 137  K 3.4*   < >  --  3.9   < > 3.9   < > 4.8 4.5  CL 104  --   --  106  --  105  --  106 102  CO2 22  --   --  21*  --  20*  --  24 22  GLUCOSE 112*  --   --  122*  --  126*  --  107* 92  BUN 23  --   --  20  --  27*  --  46* 46*  CREATININE 1.61*  --   --  1.29*  --  1.36*  --  1.32* 1.25*  CALCIUM 8.3*  --   --  7.8*  --  8.0*  --  8.2* 8.9  MG  --   --  1.0*  --   --   --   --  1.9  --    < > = values in this interval not displayed.   GFR Estimated Creatinine Clearance: 14.3 mL/min (A) (by C-G formula based on SCr of 1.25 mg/dL (H)). Liver Function Tests: Recent Labs  Lab 03/11/20 1307  AST 25  ALT 15  ALKPHOS 75  BILITOT 0.8  PROT 7.0  ALBUMIN 3.8   No results for input(s): LIPASE, AMYLASE in the last 168 hours. No results for input(s): AMMONIA in the last 168 hours. Coagulation profile Recent Labs  Lab 03/11/20 1307  INR 1.1    CBC: Recent Labs  Lab 03/11/20 1307 03/11/20 1307 03/12/20 0642 03/12/20 1419 03/13/20 0353 03/14/20 0259 03/15/20 0156  WBC 7.3  --  6.2  --  8.3 8.2 7.8  NEUTROABS 5.6  --   --   --   --   --   --   HGB 9.7*   < > 7.5* 9.3* 8.0* 7.3* 9.7*  HCT 30.5*   < > 23.4* 29.0* 24.7* 23.0* 28.9*  MCV 99.0  --  98.3  --  92.9 93.1 88.7  PLT 206  --  179  --  152 155 175   < > = values in this interval not displayed.   Cardiac Enzymes: No results for input(s): CKTOTAL, CKMB, CKMBINDEX, TROPONINI in the last 168 hours. BNP: Invalid input(s): POCBNP CBG: No results for input(s): GLUCAP in the last 168 hours. D-Dimer No results for input(s): DDIMER in the last 72 hours. Hgb A1c No results for input(s): HGBA1C in the last 72 hours. Lipid Profile No results for input(s): CHOL, HDL, LDLCALC, TRIG, CHOLHDL, LDLDIRECT in the  last 72 hours. Thyroid function studies No results for input(s): TSH, T4TOTAL, T3FREE, THYROIDAB in the last 72 hours.  Invalid input(s): FREET3 Anemia work up No results for input(s): VITAMINB12, FOLATE, FERRITIN, TIBC, IRON, RETICCTPCT in the last 72 hours. Microbiology Recent Results (from the past 240 hour(s))  SARS Coronavirus 2 by RT PCR (hospital order, performed in [facility] hospital lab) Nasopharyngeal Nasopharyngeal Swab     Status: None  Collection Time: 03/11/20  1:07 PM   Specimen: Nasopharyngeal Swab  Result Value Ref Range Status   SARS Coronavirus 2 NEGATIVE NEGATIVE Final    Comment: (NOTE) SARS-CoV-2 target nucleic acids are NOT DETECTED. The SARS-CoV-2 RNA is generally detectable in upper and lower respiratory specimens during the acute phase of infection. The lowest concentration of SARS-CoV-2 viral copies this assay can detect is 250 copies / mL. A negative result does not preclude SARS-CoV-2 infection and should not be used as the sole basis for treatment or other patient management decisions.  A negative result may occur with improper specimen collection / handling, submission of specimen other than nasopharyngeal swab, presence of viral mutation(s) within the areas targeted by this assay, and inadequate number of viral copies (<250 copies / mL). A negative result must be combined with clinical observations, patient history, and epidemiological information. Fact Sheet for Patients:   StrictlyIdeas.no Fact Sheet for Healthcare Providers: BankingDealers.co.za This test is not yet approved or cleared  by the [place] FDA and has been authorized for detection and/or diagnosis of SARS-CoV-2 by FDA under an Emergency Use Authorization (EUA).  This EUA will remain in effect (meaning this test can be used) for the duration of the COVID-19 declaration under Section 564(b)(1) of the Act, 21 U.S.C. section  360bbb-3(b)(1), unless the authorization is terminated or revoked sooner. Performed at [facility], [place] [address], [place], [place] [address]   Surgical pcr screen     Status: None   Collection Time: 03/11/20 11:59 PM   Specimen: Nasal Mucosa; Nasal Swab  Result Value Ref Range Status   MRSA, PCR NEGATIVE NEGATIVE Final   Staphylococcus aureus NEGATIVE NEGATIVE Final    Comment: (NOTE) The Xpert SA Assay (FDA approved for NASAL specimens in patients 36 years of age and older), is one component of a comprehensive surveillance program. It is not intended to diagnose infection nor to guide or monitor treatment. Performed at [facility], [place] [address], [place], [place] [address]      Discharge Instructions:   Discharge Instructions    Increase activity slowly   Complete by: As directed      Allergies as of 03/15/2020      Reactions   Clarithromycin Anaphylaxis, Swelling   Throat swelling   Prednisone Anaphylaxis, Swelling   Throat swelling and tightness from throat to stomach (05/22/15)      Medication List    STOP taking these medications   amLODipine 5 MG tablet Commonly known as: NORVASC   diclofenac sodium 1 % Gel Commonly known as: VOLTAREN     TAKE these medications   acetaminophen 500 MG tablet Commonly known as: TYLENOL Take 500 mg by mouth every 6 (six) hours as needed (pain).   allopurinol 100 MG tablet Commonly known as: ZYLOPRIM Take 200 mg by mouth daily.   aspirin EC 81 MG tablet Take 81 mg by mouth daily.   atorvastatin 10 MG tablet Commonly known as: LIPITOR Take 10 mg by mouth daily. Reported on 4/48/1856   Bystolic 20 MG Tabs Generic drug: Nebivolol HCl Take 1 tablet by mouth daily.   enoxaparin 30 MG/0.3ML injection Commonly known as: LOVENOX Inject 0.3 mLs (30 mg total) into the skin daily.   furosemide 20 MG tablet Commonly known as: LASIX Take 20 mg by mouth every Monday, Wednesday, and Friday.   hydrALAZINE 25  MG tablet Commonly known as: APRESOLINE Take 1 tablet (25 mg total) by mouth 3 (three) times  daily. What changed:   medication strength  how much to take   HYDROcodone-acetaminophen 5-325 MG tablet Commonly known as: NORCO/VICODIN Take 1 tablet by mouth every 6 (six) hours as needed for severe pain.   lansoprazole 15 MG capsule Commonly known as: PREVACID Take 15 mg by mouth daily at 12 noon.   spironolactone 25 MG tablet Commonly known as: ALDACTONE Take 12.5 mg by mouth daily.   Synthroid 75 MCG tablet Generic drug: levothyroxine Take 1 tablet by mouth daily.   SYSTANE OP Place 1 drop into both eyes 2 (two) times daily as needed (dry eyes).   VITAMIN B-12 IJ Inject as directed every 30 (thirty) days. Next injection due 05/24/15 at Dr. Eugenio Hoes office      Follow-up Information    Haddix, Thomasene Lot, MD. Schedule an appointment as soon as possible for a visit in 2 weeks.   Specialty: Orthopedic Surgery Why: For wound re-check, for repeat x-rays Contact information: [place] [address] [phone]        Anda Kraft, MD Follow up in 1 week(s).   Specialty: Endocrinology Contact information: [address] [place] [address] [phone]            Time coordinating discharge: 35 min  Signed:  Geradine Girt DO  [facility] 03/15/2020, 9:34 AM

## 2020-03-15 NOTE — TOC Progression Note (Addendum)
Transition of Care [facility]) - Progression Note    Patient Details  Name: Jennifer Patel MRN: [id number] Date of Birth: [birth year]  Transition of Care [facility]) CM/SW Contact  Sharin Mons, RN Phone Number: [phone] 03/15/2020, 1:31 PM  Clinical Narrative:     NCM received SNF authorization, approved for 4 days, # [id number]. Ambulance transport authorization, # [id number]. Pt and daughter Hassan Rowan made aware. COVID pending....   Joycelyn Man (Daughter)      [phone]      5/24 @ 1430 Pt with COVID vaccination card.  [facility] admission liaison / Caryl Pina stated ok to send pt to facility without resulted COVID.  Expected Discharge Plan: [place] Barriers to Discharge: SNF Pending bed offer, Insurance Authorization  Expected Discharge Plan and Services Expected Discharge Plan: [place] arrangements for the past 2 months: Apartment Expected Discharge Date: 03/15/20                                     Social Determinants of Health (SDOH) Interventions    Readmission Risk Interventions No flowsheet data found.

## 2020-03-15 NOTE — Progress Notes (Signed)
Physical Therapy Treatment Patient Details Name: LINNIE DELGRANDE MRN: [id number] DOB: 1931-12-07 Today's Date: 03/15/2020    History of Present Illness Pt is a 86 y.o. F with significant PMH of hypertension, TIA who was admitted May 20 after a mechanical fall with right femur fracture. Underwent ORIF on 03/12/2020.     PT Comments    Patient received in bed, very pleasant and cooperative. Reports she is feeling better today, not having pain like she was. Require min guard for bed mobility and sit to stand. She attempted to walk, but had difficulty weight shifting due to pain/weakness in R LE. Required mod assist and RW to take 2 steps from bed to recliner. She will continue to benefit from skilled PT while here to improve functional independence, strength and safety with mobility.      Follow Up Recommendations  SNF;Supervision/Assistance - 24 hour     Equipment Recommendations  None recommended by PT;Other (comment)(TBD)    Recommendations for Other Services       Precautions / Restrictions Precautions Precautions: Fall Restrictions Weight Bearing Restrictions: No RLE Weight Bearing: Weight bearing as tolerated    Mobility  Bed Mobility Overal bed mobility: Needs Assistance Bed Mobility: Supine to Sit     Supine to sit: Min guard        Transfers Overall transfer level: Needs assistance Equipment used: Rolling walker (2 wheeled) Transfers: Sit to/from Stand Sit to Stand: Min assist            Ambulation/Gait Ambulation/Gait assistance: Min Web designer (Feet): 3 Feet Assistive device: Rolling walker (2 wheeled) Gait Pattern/deviations: Step-to pattern;Decreased stance time - right;Decreased step length - left Gait velocity: decr   General Gait Details: moving well, however has difficulty weight bearing on right to take steps. Required min/mod assist and RW to assist with weight shifting.   Stairs             Wheelchair Mobility     Modified Rankin (Stroke Patients Only)       Balance Overall balance assessment: Needs assistance Sitting-balance support: Feet supported Sitting balance-Leahy Scale: Good Sitting balance - Comments: close supervision   Standing balance support: Bilateral upper extremity supported;During functional activity Standing balance-Leahy Scale: Fair Standing balance comment: reliant on RW and additional external A                            Cognition Arousal/Alertness: Awake/alert Behavior During Therapy: WFL for tasks assessed/performed Overall Cognitive Status: Within Functional Limits for tasks assessed                                 General Comments: patient not confused this day. Participated well and making good progress      Exercises Total Joint Exercises Ankle Circles/Pumps: AROM;10 reps;Both Heel Slides: AAROM;Right;10 reps Hip ABduction/ADduction: AAROM;Right;10 reps Long Arc Quad: AAROM;Right;10 reps    General Comments        Pertinent Vitals/Pain Pain Assessment: Faces Faces Pain Scale: Hurts a little bit Pain Location: RLE with WB'ing Pain Descriptors / Indicators: Sore Pain Intervention(s): Monitored during session;Repositioned    Home Living                      Prior Function            PT Goals (current goals can now be found in the care  plan section) Acute Rehab PT Goals Patient Stated Goal: to get back to walking PT Goal Formulation: With patient Time For Goal Achievement: 03/26/20 Potential to Achieve Goals: Fair Progress towards PT goals: Progressing toward goals    Frequency    Min 3X/week      PT Plan Current plan remains appropriate    Co-evaluation              AM-PAC PT "6 Clicks" Mobility   Outcome Measure  Help needed turning from your back to your side while in a flat bed without using bedrails?: A Little Help needed moving from lying on your back to sitting on the side of a  flat bed without using bedrails?: A Little Help needed moving to and from a bed to a chair (including a wheelchair)?: A Lot Help needed standing up from a chair using your arms (e.g., wheelchair or bedside chair)?: A Little Help needed to walk in hospital room?: Total Help needed climbing 3-5 steps with a railing? : Total 6 Click Score: 13    End of Session Equipment Utilized During Treatment: Gait belt Activity Tolerance: Patient tolerated treatment well Patient left: in chair;with call bell/phone within reach;with chair alarm set Nurse Communication: Mobility status PT Visit Diagnosis: Other abnormalities of gait and mobility (R26.89);Muscle weakness (generalized) (M62.81);Pain;Difficulty in walking, not elsewhere classified (R26.2);History of falling (Z91.81) Pain - Right/Left: Right Pain - part of body: Leg     Time: 1000-1015 PT Time Calculation (min) (ACUTE ONLY): 15 min  Charges:  $Therapeutic Exercise: 8-22 mins                     Shanique Aslinger, PT, GCS 03/15/20,11:36 AM

## 2020-03-15 NOTE — TOC Transition Note (Addendum)
Transition of Care [facility]) - CM/SW Discharge Note   Patient Details  Name: Jennifer Patel MRN: [id number] Date of Birth: 10/01/1932  Transition of Care [facility]) CM/SW Contact:  Sharin Mons, RN Phone Number: [phone] 03/15/2020, 2:19 PM   Clinical Narrative:    Patient will DC to: [facility] Anticipated DC date: 03/15/2020 Family notified: yes, daughter Yvonna Alanis by: Corey Harold   Per MD patient ready for DC today . RN, patient, patient's family, and facility notified of DC. Discharge Summary and FL2 sent to facility. RN to call report prior to discharge [phone]). DC packet on chart. Ambulance transport requested for patient.   RNCM will sign off for now as intervention is no longer needed. Please consult [place] again if new needs arise.    Final next level of care: Skilled Nursing Facility Barriers to Discharge: No Barriers Identified   Patient Goals and CMS Choice Patient states their goals for this hospitalization and ongoing recovery are:: To get betrter CMS Medicare.gov Compare Post Acute Care list provided to:: Patient Choice offered to / list presented to : Patient  Discharge Placement                       Discharge Plan and Services                                     Social Determinants of Health (SDOH) Interventions     Readmission Risk Interventions No flowsheet data found.

## 2020-03-16 ENCOUNTER — Encounter: Payer: Self-pay | Admitting: Internal Medicine

## 2020-03-16 ENCOUNTER — Non-Acute Institutional Stay (SKILLED_NURSING_FACILITY): Payer: PPO | Admitting: Internal Medicine

## 2020-03-16 DIAGNOSIS — S72421A Displaced fracture of lateral condyle of right femur, initial encounter for closed fracture: Secondary | ICD-10-CM

## 2020-03-16 DIAGNOSIS — I1A0 Resistant hypertension: Secondary | ICD-10-CM

## 2020-03-16 DIAGNOSIS — D62 Acute posthemorrhagic anemia: Secondary | ICD-10-CM | POA: Diagnosis not present

## 2020-03-16 DIAGNOSIS — E7800 Pure hypercholesterolemia, unspecified: Secondary | ICD-10-CM

## 2020-03-16 DIAGNOSIS — E039 Hypothyroidism, unspecified: Secondary | ICD-10-CM | POA: Diagnosis not present

## 2020-03-16 DIAGNOSIS — I10 Essential (primary) hypertension: Secondary | ICD-10-CM

## 2020-03-16 DIAGNOSIS — N179 Acute kidney failure, unspecified: Secondary | ICD-10-CM | POA: Diagnosis not present

## 2020-03-16 DIAGNOSIS — S72431A Displaced fracture of medial condyle of right femur, initial encounter for closed fracture: Secondary | ICD-10-CM

## 2020-03-16 NOTE — Progress Notes (Signed)
Provider:  Rexene Edison. Mariea Clonts, D.O., C.M.D. Location:  [place] Room Number: [place]:   SNF  PCP: Gayland Curry, DO Patient Care Team: Gayland Curry, DO as PCP - General (Geriatric Medicine)  Extended Emergency Contact Information Primary Emergency Contact: Joycelyn Man Mobile Phone: [phone] Relation: Daughter Interpreter needed? No Secondary Emergency Contact: Johnice, Riebe Mobile Phone: [phone] Relation: Grandson Interpreter needed? No  Code Status: FULL CODE Goals of Care: Advanced Directive information Advanced Directives 03/16/2020  Does Patient Have a Medical Advance Directive? No  Would patient like information on creating a medical advance directive? No - Patient declined      Chief Complaint  Patient presents with  . New Admit To SNF    New admit     HPI: Patient is a 85 y.o. female with h/o refractory htn, remote TIA in '90s, hypothyroidism, gout, CKD3, hyperlipidemia seen today for admission to [facility] for rehab s/p admission from 5/20-5/24 with mechanical fall that resulted in severe right knee pain to where she could not bear weight.  At baseline, she lives alone, walks her dog 15-20 mins each day.  Stays on the first floor of her 2 story home.  Xrays in the ED revealed right supracondylar distal femur fracture.  She underwent ORIF 03/12/20 by Dr. Lennette Bihari Haddix.  She was put on lovenox DVT prophylaxis and PT evaluated her and recommended SNF rehab.  CBC was recommended at one week.  She is WBAT here.  She's on a low sodium, heart healthy, carb modified diet wit regular consistently.  Her hgb preop was 7.5 (normocytic) and she was given 2 units PRBCs--discharge hgb was 9.7 5/24.  She had some postop delirium (I do note some chronic ischemic changes on her CT brain and age related atrophy).  She also had hypokalemia that was repleted.  covid was negative 5/20.    She is to f/u with Dr. Doreatha Martin in  2 wks for wound check and repeat xrays.  Dr. Wilson Singer gives her her B12 injections.  Hospital record indicates one week f/u needed for this--she says she had it the day she fell.  Past Medical History:  Diagnosis Date  . High cholesterol   . Hypertension   . Renal insufficiency   . Thyroid disease    Past Surgical History:  Procedure Laterality Date  . ABDOMINAL HYSTERECTOMY    . CHOLECYSTECTOMY    . ORIF FEMUR FRACTURE Right 03/12/2020   Procedure: OPEN REDUCTION INTERNAL FIXATION (ORIF) DISTAL FEMUR FRACTURE;  Surgeon: Shona Needles, MD;  Location: [place] [facility];  Service: Orthopedics;  Laterality: Right;  . PERIPHERAL VASCULAR CATHETERIZATION N/A 11/16/2015   Procedure: Renal Angiography;  Surgeon: Adrian Prows, MD;  Location: [facility];  Service: Cardiovascular;  Laterality: N/A;    Social History   Socioeconomic History  . Marital status: Widowed    Spouse name: Not on file  . Number of children: 2  . Years of education: 8  . Highest education level: Not on file  Occupational History  . Occupation: retired  Tobacco Use  . Smoking status: Former Research scientist (life sciences)  . Smokeless tobacco: Never Used  Substance and Sexual Activity  . Alcohol use: No    Alcohol/week: 0.0 standard drinks  . Drug use: No  . Sexual activity: Not Currently  Other Topics Concern  . Not on file  Social History Narrative  . Not on file   Social Determinants of Health  Financial Resource Strain:   . Difficulty of Paying Living Expenses:   Food Insecurity:   . Worried About Charity fundraiser in the Last Year:   . Arboriculturist in the Last Year:   Transportation Needs:   . Film/video editor (Medical):   Marland Kitchen Lack of Transportation (Non-Medical):   Physical Activity:   . Days of Exercise per Week:   . Minutes of Exercise per Session:   Stress:   . Feeling of Stress :   Social Connections:   . Frequency of Communication with Friends and Family:   . Frequency of Social Gatherings with Friends and  Family:   . Attends Religious Services:   . Active Member of Clubs or Organizations:   . Attends Archivist Meetings:   Marland Kitchen Marital Status:     reports that she has quit smoking. She has never used smokeless tobacco. She reports that she does not drink alcohol or use drugs.  Functional Status Survey:    History reviewed. No pertinent family history.  Health Maintenance  Topic Date Due  . COVID-19 Vaccine (1) Never done  . TETANUS/TDAP  Never done  . DEXA SCAN  Never done  . INFLUENZA VACCINE  05/23/2020  . PNA vac Low Risk Adult (2 of 2 - PCV13) 03/15/2021    Allergies  Allergen Reactions  . Clarithromycin Anaphylaxis and Swelling    Throat swelling  . Prednisone Anaphylaxis and Swelling    Throat swelling and tightness from throat to stomach (05/22/15)    Outpatient Encounter Medications as of 03/16/2020  Medication Sig  . acetaminophen (TYLENOL) 500 MG tablet Take 500 mg by mouth every 6 (six) hours as needed (pain).  Marland Kitchen allopurinol (ZYLOPRIM) 100 MG tablet Take 200 mg by mouth daily.   Marland Kitchen aspirin EC 81 MG tablet Take 81 mg by mouth daily.  Marland Kitchen atorvastatin (LIPITOR) 10 MG tablet Take 10 mg by mouth daily. Reported on 11/11/2015  . Cyanocobalamin (VITAMIN B-12 IJ) Inject as directed every 30 (thirty) days. Next injection due 05/24/15 at Dr. Eugenio Hoes office  . enoxaparin (LOVENOX) 30 MG/0.3ML injection Inject 0.3 mLs (30 mg total) into the skin daily.  . furosemide (LASIX) 20 MG tablet Take 20 mg by mouth every Monday, Wednesday, and Friday.   . hydrALAZINE (APRESOLINE) 25 MG tablet Take 1 tablet (25 mg total) by mouth 3 (three) times daily.  Marland Kitchen HYDROcodone-acetaminophen (NORCO/VICODIN) 5-325 MG tablet Take 1 tablet by mouth every 6 (six) hours as needed for severe pain.  Marland Kitchen lansoprazole (PREVACID) 15 MG capsule Take 15 mg by mouth daily at 12 noon.  Marland Kitchen levothyroxine (SYNTHROID) 75 MCG tablet Take 1 tablet by mouth daily.  . Nebivolol HCl (BYSTOLIC) 20 MG TABS Take 1 tablet by  mouth daily.  Vladimir Faster Glycol-Propyl Glycol (SYSTANE OP) Place 1 drop into both eyes 2 (two) times daily as needed (dry eyes).  Marland Kitchen spironolactone (ALDACTONE) 25 MG tablet Take 12.5 mg by mouth daily.    No facility-administered encounter medications on file as of 03/16/2020.    Review of Systems  Constitutional: Negative for chills, fever and malaise/fatigue.  HENT: Negative for congestion and sore throat.   Eyes: Negative for blurred vision.       Glasses  Respiratory: Negative for cough and shortness of breath.   Cardiovascular: Negative for chest pain, palpitations and leg swelling.  Gastrointestinal: Negative for abdominal pain and constipation.  Genitourinary: Negative for dysuria.  Musculoskeletal: Positive for falls and joint pain.  Skin: Negative for itching and rash.  Neurological: Negative for dizziness and loss of consciousness.  Psychiatric/Behavioral: Negative for depression. The patient is not nervous/anxious and does not have insomnia.     Vitals:   03/16/20 1653  BP: 124/62  Pulse: 78  Temp: (!) 97 F (36.1 C)  Weight: 102 lb 1.6 oz (46.3 kg)  Height: 3\' 11"  (1.194 m)   Body mass index is 32.5 kg/m. Physical Exam Vitals reviewed.  Constitutional:      General: She is not in acute distress.    Appearance: Normal appearance. She is not toxic-appearing.     Comments: Thin petite female seated in recliner  HENT:     Head: Normocephalic and atraumatic.     Right Ear: External ear normal.     Left Ear: External ear normal.     Nose: Nose normal.     Mouth/Throat:     Pharynx: Oropharynx is clear.  Eyes:     Extraocular Movements: Extraocular movements intact.     Conjunctiva/sclera: Conjunctivae normal.     Pupils: Pupils are equal, round, and reactive to light.     Comments: glasses  Cardiovascular:     Rate and Rhythm: Normal rate and regular rhythm.     Pulses: Normal pulses.     Heart sounds: Normal heart sounds.  Pulmonary:     Effort: Pulmonary  effort is normal.     Breath sounds: Normal breath sounds. No wheezing, rhonchi or rales.  Abdominal:     General: Bowel sounds are normal.     Palpations: Abdomen is soft.  Musculoskeletal:        General: Normal range of motion.     Cervical back: Neck supple.     Right lower leg: No edema.     Left lower leg: No edema.     Comments: Right hip incision with dressing intact, no significant visible drainage or erythema  Lymphadenopathy:     Cervical: No cervical adenopathy.  Skin:    General: Skin is warm and dry.     Coloration: Skin is pale.  Neurological:     General: No focal deficit present.     Mental Status: She is alert and oriented to person, place, and time.     Cranial Nerves: No cranial nerve deficit.     Sensory: No sensory deficit.     Motor: No weakness.     Coordination: Coordination normal.     Gait: Gait normal.     Deep Tendon Reflexes: Reflexes normal.     Comments: Has walker now  Psychiatric:        Mood and Affect: Mood normal.        Behavior: Behavior normal.        Thought Content: Thought content normal.        Judgment: Judgment normal.     Comments: Very pleasant and sociable     Labs reviewed: Basic Metabolic Panel: Recent Labs    03/11/20 1933 03/12/20 0642 03/13/20 0353 03/14/20 0259 03/15/20 0156  NA  --    < > 135 137 137  K  --    < > 3.9 4.8 4.5  CL  --    < > 105 106 102  CO2  --    < > 20* 24 22  GLUCOSE  --    < > 126* 107* 92  BUN  --    < > 27* 46* 46*  CREATININE  --    < >  1.36* 1.32* 1.25*  CALCIUM  --    < > 8.0* 8.2* 8.9  MG 1.0*  --   --  1.9  --    < > = values in this interval not displayed.   Liver Function Tests: Recent Labs    03/11/20 1307  AST 25  ALT 15  ALKPHOS 75  BILITOT 0.8  PROT 7.0  ALBUMIN 3.8   No results for input(s): LIPASE, AMYLASE in the last 8760 hours. No results for input(s): AMMONIA in the last 8760 hours. CBC: Recent Labs    05/08/19 1126 05/08/19 1126 03/11/20 1307  03/12/20 0642 03/13/20 0353 03/14/20 0259 03/15/20 0156  WBC 8.4   < > 7.3   < > 8.3 8.2 7.8  NEUTROABS 7.3  --  5.6  --   --   --   --   HGB 9.2*   < > 9.7*   < > 8.0* 7.3* 9.7*  HCT 28.4*   < > 30.5*   < > 24.7* 23.0* 28.9*  MCV 98.3   < > 99.0   < > 92.9 93.1 88.7  PLT 221   < > 206   < > 152 155 175   < > = values in this interval not displayed.   Cardiac Enzymes: No results for input(s): CKTOTAL, CKMB, CKMBINDEX, TROPONINI in the last 8760 hours. BNP: Invalid input(s): POCBNP No results found for: HGBA1C No results found for: TSH No results found for: VITAMINB12 No results found for: FOLATE Lab Results  Component Value Date   IRON 21 (L) 03/11/2020   TIBC 318 03/11/2020   FERRITIN 34 03/11/2020    Imaging and Procedures obtained prior to SNF admission: DG Knee 1-2 Views Right  Result Date: 03/11/2020 CLINICAL DATA:  Fall with pain. EXAM: RIGHT KNEE - 1-2 VIEW COMPARISON:  None. FINDINGS: Comminuted displaced fracture of the distal femoral metaphyseal region. Anterior displacement of the proximal fragment by the with of the bone. Relationship between the femur and the tibia remain intact. No fracture line seen extending to the articular surface. No patellar fracture. IMPRESSION: Comminuted fracture of the femoral metaphysis with posterior displacement of the distal fracture fragments. Electronically Signed   By: Nelson Chimes M.D.   On: 03/11/2020 12:38   CT Head Wo Contrast  Result Date: 03/11/2020 CLINICAL DATA:  Fall earlier today. EXAM: CT HEAD WITHOUT CONTRAST CT CERVICAL SPINE WITHOUT CONTRAST TECHNIQUE: Multidetector CT imaging of the head and cervical spine was performed following the standard protocol without intravenous contrast. Multiplanar CT image reconstructions of the cervical spine were also generated. COMPARISON:  06/23/2008 FINDINGS: CT HEAD FINDINGS Brain: Ventricles and cisterns are within normal. There is mild age related atrophic change. There is chronic  ischemic microvascular disease. No mass, mass effect, shift of midline structures or acute hemorrhage. No evidence of acute infarction. Vascular: No hyperdense vessel or unexpected calcification. Skull: Normal. Negative for fracture or focal lesion. Sinuses/Orbits: No acute finding. Other: None. CT CERVICAL SPINE FINDINGS Alignment: No evidence of posttraumatic subluxation. Patient is kyphotic. Skull base and vertebrae: Mild spondylosis throughout the cervical spine. Atlantoaxial articulation is unremarkable. There is moderate facet arthropathy. No acute fracture or subluxation. Soft tissues and spinal canal: No prevertebral fluid or swelling. No visible canal hematoma. Disc levels:  Minimal disc space narrowing at the C5-6 level. Upper chest: No acute findings. Other: None. IMPRESSION: 1.  No acute brain injury. 2.  Age related atrophy and chronic ischemic microvascular disease. 3.  No acute cervical  spine injury. 4. Mild spondylosis of the cervical spine with minimal disc disease at the C5-6 level. Electronically Signed   By: Marin Olp M.D.   On: 03/11/2020 15:03   CT Cervical Spine Wo Contrast  Result Date: 03/11/2020 CLINICAL DATA:  Fall earlier today. EXAM: CT HEAD WITHOUT CONTRAST CT CERVICAL SPINE WITHOUT CONTRAST TECHNIQUE: Multidetector CT imaging of the head and cervical spine was performed following the standard protocol without intravenous contrast. Multiplanar CT image reconstructions of the cervical spine were also generated. COMPARISON:  06/23/2008 FINDINGS: CT HEAD FINDINGS Brain: Ventricles and cisterns are within normal. There is mild age related atrophic change. There is chronic ischemic microvascular disease. No mass, mass effect, shift of midline structures or acute hemorrhage. No evidence of acute infarction. Vascular: No hyperdense vessel or unexpected calcification. Skull: Normal. Negative for fracture or focal lesion. Sinuses/Orbits: No acute finding. Other: None. CT CERVICAL SPINE  FINDINGS Alignment: No evidence of posttraumatic subluxation. Patient is kyphotic. Skull base and vertebrae: Mild spondylosis throughout the cervical spine. Atlantoaxial articulation is unremarkable. There is moderate facet arthropathy. No acute fracture or subluxation. Soft tissues and spinal canal: No prevertebral fluid or swelling. No visible canal hematoma. Disc levels:  Minimal disc space narrowing at the C5-6 level. Upper chest: No acute findings. Other: None. IMPRESSION: 1.  No acute brain injury. 2.  Age related atrophy and chronic ischemic microvascular disease. 3.  No acute cervical spine injury. 4. Mild spondylosis of the cervical spine with minimal disc disease at the C5-6 level. Electronically Signed   By: Marin Olp M.D.   On: 03/11/2020 15:03   CT KNEE RIGHT WO CONTRAST  Result Date: 03/11/2020 CLINICAL DATA:  Evaluate distal femur fracture EXAM: CT OF THE RIGHT KNEE WITHOUT CONTRAST TECHNIQUE: Multidetector CT imaging of the right knee was performed according to the standard protocol. Multiplanar CT image reconstructions were also generated. COMPARISON:  X-ray 03/11/2020 FINDINGS: Bones/Joint/Cartilage Redemonstration of an acute comminuted fracture of the distal femoral metaphysis with approximately 1 shaft width of posterior displacement. Fracture is also slightly medially displaced. The proximal fracture component abuts the superior pole of the patella (series 20, image 38). No evidence of patellar fracture. Tricompartmental osteoarthritis of the right knee is most pronounced within the lateral tibiofemoral compartment with moderate-severe joint space narrowing, subchondral sclerosis, subchondral cystic changes and prominent marginal osteophytosis. The proximal tibia and fibula appear intact without fracture. There is a large lipohemarthrosis. Ligaments Suboptimally assessed by CT. Muscles and Tendons No definite acute tendinous injury. The quadriceps and patellar tendons appear grossly  intact. Grossly preserved muscle bulk. Soft tissues There is soft tissue hemorrhage adjacent to the fracture site. No soft tissue air to suggest open fracture. IMPRESSION: 1. Acute comminuted fracture of the distal femoral metaphysis with approximately 1 shaft width of posterior displacement. The proximal fracture component abuts the superior pole of the patella without evidence of patellar fracture hila are. 2. Large lipohemarthrosis. 3. Tricompartmental osteoarthritis of the right knee is most pronounced within the lateral tibiofemoral compartment. Electronically Signed   By: Davina Poke D.O.   On: 03/11/2020 15:15   DG Chest Port 1 View  Result Date: 03/11/2020 CLINICAL DATA:  Fall. EXAM: PORTABLE CHEST 1 VIEW COMPARISON:  May 08, 2019. FINDINGS: Stable cardiomegaly. Atherosclerosis of thoracic aorta is noted. No pneumothorax or pleural effusion is noted. Lungs are clear. Large hiatal hernia is noted. Bony thorax is unremarkable. IMPRESSION: Aortic atherosclerosis. Large hiatal hernia. No acute cardiopulmonary abnormality seen. Aortic Atherosclerosis (ICD10-I70.0). Electronically Signed  By: Marijo Conception M.D.   On: 03/11/2020 12:39   DG Knee Right Port  Result Date: 03/12/2020 CLINICAL DATA:  ORIF femur fracture EXAM: PORTABLE RIGHT KNEE - 1-2 VIEW COMPARISON:  03/11/2020 FINDINGS: Fracture of the distal femoral metaphysis has been reduced and interval placement of a lateral plate and multiple screws across the fracture. Fracture alignment is satisfactory. Hardware positioning is satisfactory. Advanced degenerative change in the lateral joint compartment. IMPRESSION: Satisfactory reduction and fixation of distal femur fracture. Electronically Signed   By: Franchot Gallo M.D.   On: 03/12/2020 11:47   DG C-Arm 1-60 Min  Result Date: 03/12/2020 CLINICAL DATA:  Open reduction internal fixation for fracture EXAM: DG C-ARM 1-60 MIN; RIGHT FEMUR 2 VIEWS FLUOROSCOPY TIME:  Fluoroscopy Time: 1  minutes 0 seconds Number of Acquired Spot Images: 8 COMPARISON:  Preoperative examination right femur Mar 11, 2020 FINDINGS: Initial images again demonstrate comminuted fracture of the distal femoral metaphysis with impaction. Subsequent frontal and lateral images show screw and plate fixation through the fracture of the distal femur with alignment overall anatomic after fixation. No new fractures evident. No evident knee dislocation. Degenerative change noted in the patellofemoral joint. IMPRESSION: Open reduction internal fixation for comminuted fracture distal femoral metaphysis with alignment near anatomic after fixation. No dislocation. No new fracture evident. Electronically Signed   By: Lowella Grip III M.D.   On: 03/12/2020 11:10   DG FEMUR, MIN 2 VIEWS RIGHT  Result Date: 03/12/2020 CLINICAL DATA:  Open reduction internal fixation for fracture EXAM: DG C-ARM 1-60 MIN; RIGHT FEMUR 2 VIEWS FLUOROSCOPY TIME:  Fluoroscopy Time: 1 minutes 0 seconds Number of Acquired Spot Images: 8 COMPARISON:  Preoperative examination right femur Mar 11, 2020 FINDINGS: Initial images again demonstrate comminuted fracture of the distal femoral metaphysis with impaction. Subsequent frontal and lateral images show screw and plate fixation through the fracture of the distal femur with alignment overall anatomic after fixation. No new fractures evident. No evident knee dislocation. Degenerative change noted in the patellofemoral joint. IMPRESSION: Open reduction internal fixation for comminuted fracture distal femoral metaphysis with alignment near anatomic after fixation. No dislocation. No new fracture evident. Electronically Signed   By: Lowella Grip III M.D.   On: 03/12/2020 11:10    Assessment/Plan 1. Closed bicondylar fracture of right femur, initial encounter [facility]) -her for rehab s/p R ORIF with Dr. Memory Dance lovenox dvt prophylaxis thru 6/23 and f/u with him as planned for xrays and wound check -using  tylenol for pain and doing very well  2. Hypothyroidism, unspecified type -cont levothyroxine  3. Resistant hypertension -bp excellent at present -cont bystolic, aldactone, lasix, hydralazine  4. AKI (acute kidney injury) ([place]) -resolved, f/u bmp again here, Avoid nephrotoxic agents like nsaids, dose adjust renally excreted meds, hydrate. -PCP records not in [facility] for last lipid  5. Pure hypercholesterolemia -cont her home lipitor; PCP records not in [facility] for last lipid  6. Acute blood loss anemia -f/u cbc Lab Results  Component Value Date   HGB 9.7 (L) 03/15/2020  -cont monthly b12 1043mcg IM--next due 6/18  Family/ staff Communication: discussed with snf nurse  Labs/tests ordered:  Cbc, bmp as per facility protocol  Davius Goudeau L. Jayd Forrey, D.O. [facility] [address], [place] [address] Cell Phone (Mon-Fri 8am-5pm):  [phone] On Call:  [phone] & follow prompts after 5pm & weekends Office Phone:  [phone] Office Fax:  [phone]

## 2020-03-29 ENCOUNTER — Non-Acute Institutional Stay (SKILLED_NURSING_FACILITY): Payer: PPO | Admitting: Internal Medicine

## 2020-03-29 DIAGNOSIS — I10 Essential (primary) hypertension: Secondary | ICD-10-CM

## 2020-03-29 DIAGNOSIS — S72431A Displaced fracture of medial condyle of right femur, initial encounter for closed fracture: Secondary | ICD-10-CM

## 2020-03-29 DIAGNOSIS — S72421A Displaced fracture of lateral condyle of right femur, initial encounter for closed fracture: Secondary | ICD-10-CM

## 2020-03-29 DIAGNOSIS — N179 Acute kidney failure, unspecified: Secondary | ICD-10-CM | POA: Diagnosis not present

## 2020-03-29 DIAGNOSIS — D649 Anemia, unspecified: Secondary | ICD-10-CM | POA: Diagnosis not present

## 2020-03-29 DIAGNOSIS — E039 Hypothyroidism, unspecified: Secondary | ICD-10-CM

## 2020-03-30 ENCOUNTER — Other Ambulatory Visit: Payer: Self-pay | Admitting: Internal Medicine

## 2020-03-30 ENCOUNTER — Encounter: Payer: Self-pay | Admitting: Internal Medicine

## 2020-03-30 DIAGNOSIS — S72451D Displaced supracondylar fracture without intracondylar extension of lower end of right femur, subsequent encounter for closed fracture with routine healing: Secondary | ICD-10-CM | POA: Diagnosis not present

## 2020-03-30 NOTE — Progress Notes (Signed)
This is a discharge note.  Level of care skilled.  [facility]ility [facility].  Chief complaint discharge note.  History of present illness.  Patient is a very pleasant 86 year old female who will be going home on Thursday April 01, 2020.  She was here for short-term rehab after a fall at home which resulted in a right supracondylar distal femur fracture and she underwent an ORIF and did well with this.  She did require 2 units of packed red blood cells prior to her operation secondary to a hemoglobin of 7.5.  Discharge hemoglobin was 9.7 on May 24.  Updated CBC showed a hemoglobin of 8.7 on lab done June 4  She was put on Lovenox for DVT prophylaxis --and continues on this-.  Apparently she will see orthopedics tomorrow and will have them address whether she needs to continue on this when she goes home.    She apparently has done well with therapy and will be going home apparently she is pretty independent she is pretty confident about this.  At this point she is not complaining of any pain.  And is really looking forward to going home.  Her other diagnoses include history of remote TIA years ago-as well as hypothyroidism she is on Synthroid as well as gout and continues on allopurinol.  She also has a history of hypertension is on Bystolic Aldactone and Lasix she is on Lasix 3 days a week she is also on hydralazine.  Blood pressures recently have been around 025 systolically considering her fall risk and advanced age have not been aggressive pursuing additional meds.  She does have a history of acute kidney injury but this is been stable creatinine was 1.04 on lab done on June 6.  Currently she is sitting in her chair she is bright alert appears to be doing quite well   Past Medical History:  Diagnosis Date  . High cholesterol   . Hypertension   . Renal insufficiency   . Thyroid disease         Past Surgical History:  Procedure Laterality Date  . ABDOMINAL  HYSTERECTOMY    . CHOLECYSTECTOMY    . ORIF FEMUR FRACTURE Right 03/12/2020   Procedure: OPEN REDUCTION INTERNAL FIXATION (ORIF) DISTAL FEMUR FRACTURE;  Surgeon: Shona Needles, MD;  Location: [facility];  Service: Orthopedics;  Laterality: Right;  . PERIPHERAL VASCULAR CATHETERIZATION N/A 11/16/2015   Procedure: Renal Angiography;  Surgeon: Adrian Prows, MD;  Location: [facility];  Service: Cardiovascular;  Laterality: N/A;    Social History        Socioeconomic History  . Marital status: Widowed    Spouse name: Not on file  . Number of children: 2  . Years of education: 8  . Highest education level: Not on file  Occupational History  . Occupation: retired  Tobacco Use  . Smoking status: Former Research scientist (life sciences)  . Smokeless tobacco: Never Used  Substance and Sexual Activity  . Alcohol use: No    Alcohol/week: 0.0 standard drinks  . Drug use: No  . Sexual activity: Not Currently  Other Topics Concern  . Not on file  Social History Narrative  . Not on file   Social Determinants of Health      Financial Resource Strain:   . Difficulty of Paying Living Expenses:   Food Insecurity:   . Worried About Charity fundraiser in the Last Year:   . [place] in the Last Year:  Transportation Needs:   . Film/video editor (Medical):   Marland Kitchen Lack of Transportation (Non-Medical):   Physical Activity:   . Days of Exercise per Week:   . Minutes of Exercise per Session:   Stress:   . Feeling of Stress :   Social Connections:   . Frequency of Communication with Friends and Family:   . Frequency of Social Gatherings with Friends and Family:   . Attends Religious Services:   . Active Member of Clubs or Organizations:   . Attends Archivist Meetings:   Marland Kitchen Marital Status:     reports that she has quit smoking. She has never used smokeless tobacco. She reports that she does not drink alcohol or use drugs.  Functional Status Survey:  History reviewed. No  pertinent family history.      Health Maintenance  Topic Date Due  . COVID-19 Vaccine (1) Never done  . TETANUS/TDAP  Never done  . DEXA SCAN  Never done  . INFLUENZA VACCINE  05/23/2020  . PNA vac Low Risk Adult (2 of 2 - PCV13) 03/15/2021         Allergies  Allergen Reactions  . Clarithromycin Anaphylaxis and Swelling    Throat swelling  . Prednisone Anaphylaxis and Swelling    Throat swelling and tightness from throat to stomach (05/22/15)        MEDICATIONS   Medication Sig  . acetaminophen (TYLENOL) 500 MG tablet Take 500 mg by mouth every 6 (six) hours as needed (pain).  Marland Kitchen allopurinol (ZYLOPRIM) 100 MG tablet Take 200 mg by mouth daily.   Marland Kitchen aspirin EC 81 MG tablet Take 81 mg by mouth daily.  Marland Kitchen atorvastatin (LIPITOR) 10 MG tablet Take 10 mg by mouth daily. Reported on 11/11/2015  . Cyanocobalamin (VITAMIN B-12 IJ) Inject as directed every 30 (thirty) days. Next injection due 05/24/15 at Dr. Eugenio Hoes office  . enoxaparin (LOVENOX) 30 MG/0.3ML injection Inject 0.3 mLs (30 mg total) into the skin daily.  . furosemide (LASIX) 20 MG tablet Take 20 mg by mouth every Monday, Wednesday, and Friday.   . hydrALAZINE (APRESOLINE) 25 MG tablet Take 1 tablet (25 mg total) by mouth 3 (three) times daily.  .     . lansoprazole (PREVACID) 15 MG capsule Take 15 mg by mouth daily at 12 noon.  Marland Kitchen levothyroxine (SYNTHROID) 75 MCG tablet Take 1 tablet by mouth daily.  . Nebivolol HCl (BYSTOLIC) 20 MG TABS Take 1 tablet by mouth daily.  Vladimir Faster Glycol-Propyl Glycol (SYSTANE OP) Place 1 drop into both eyes 2 (two) times daily as needed (dry eyes).  Marland Kitchen spironolactone (ALDACTONE) 25 MG tablet Take 12.5 mg by mouth daily.    Review of systems.  General she is not complaining of any fever chills says she feels well.  Skin does not complain of rashes or itching.  Surgical site does have some bruising postop.  Head ears eyes nose mouth and throat is not complain of visual changes or  sore throat.  Respiratory is not complaining of being short of breath or having a cough.  Cardiac no complaints of chest pain or concerning edema.  GI is not complaining of abdominal pain nausea vomiting diarrhea constipation.  GU does not complain of dysuria.  Musculoskeletal at this point pain appears to be controlled status post right hip repair.  Neurologic does not complain of dizziness headache numbness.  Psych appears to be in good spirits is not complaining of being depressed or anxious.  Physical exam.  Temperature is 98.0 pulse 81 respirations 18 blood pressure 140/60.  In general this is a very pleasant elderly female in no distress she is somewhat thin but quite vigorous.  Her skin is warm and dry there is some typical postop bruising around her right hip surgical site there is well-healed crusting.  Eyes visual acuity appears to be intact sclera and conjunctive are clear she has prescription lenses.  Oropharynx is clear mucous membranes moist.  Chest is clear to auscultation there is no labored breathing.  Heart is regular rate and rhythm without murmur gallop or rub she does not really have significant lower extremity edema  Abdomen is soft nontender with positive bowel sounds.  Musculoskeletal is able to use her walker moves all extremities x4 strength appears to be intact-she is kyphotic.  Neurologic appears grossly intact cannot appreciate lateralizing findings her speech is clear.  Psych she is quite pleasant and appropriate   Labs.  March 26, 2020.  Sodium 140 potassium 4.5 BUN 33.2 creatinine 1.04.  WBC 7.2 hemoglobin 8.7 platelets 271.   Recent Labs    03/11/20 1933 03/12/20 0642 03/13/20 0353 03/14/20 0259 03/15/20 0156  NA  --    < > 135 137 137  K  --    < > 3.9 4.8 4.5  CL  --    < > 105 106 102  CO2  --    < > 20* 24 22  GLUCOSE  --    < > 126* 107* 92  BUN  --    < > 27* 46* 46*  CREATININE  --    < > 1.36* 1.32* 1.25*   CALCIUM  --    < > 8.0* 8.2* 8.9  MG 1.0*  --   --  1.9  --    < > = values in this interval not displayed.     Liver Function Tests: Recent Labs (within last 365 days)     Recent Labs    03/11/20 1307  AST 25  ALT 15  ALKPHOS 75  BILITOT 0.8  PROT 7.0  ALBUMIN 3.8       Assessment and plan. 1- History of closed bicondylar fracture right femur-she appears to have done quite well with rehab-she will have followed by orthopedics apparently tomorrow-will await orthopedic input on continuing Lovenox for DVT prophylaxis and originally scheduled to run through June 23.--- Please see addendum below dated March 31, 2020-she will be anticoagulated with aspirin 325 mg twice daily for 14 days when she goes home-Lovenox will be discontinued  She does have Tylenol for pain and this appears to be effective  #2 history of hypothyroidism not stated as uncontrolled she is on Synthroid 75 mcg a day since her stay here has been quite short will defer to primary care provider.  3.  History of hypertension she is on numerous medications including Bystolic 20 mg a day-spironolactone 12.5 mg a day-Lasix 20 mg on Mondays Wednesdays and Fridays.  She is also on hydralazine 25 mg 3 times daily-as noted recent systolics appear to be around 140-at this point will defer to primary care provider but this appears to be pretty acceptable.  4.-History of acute kidney injury this resolved-her last creatinine done just 3 days ago was 1.04 which actually shows improvement this will need follow-up by primary care provider.  5.-History of acute blood loss anemia-she did receive a transfusion secondary to a preop hemoglobin of 7.5-with the transfusion it did rise up to  9.7.  Updated hemoglobin was 8.7 which may be more relatively her baseline-I do note back in July her hemoglobin was 9.2.  This will need follow-up by primary care provider as well clinically she appears to be doing quite well however.  6.   History of hyperlipidemia she is on atorvastatin 10 mg a day-not stated as uncontrolled will defer to primary care provider.  7.  History of B12 deficiency she does have orders for B12 injection q. monthly this is scheduled for June 18-this will be handled I assume by her primary care provider.  8.  History of GERD she continues on Prevacid 15 mg a day this appears to be stable.  9.  History of gout this is been stable during her stay here she is on allopurinol 200 mg a day.  When she goes home she will need continued PT and OT she also will need DME in the form of a Junior rolling walker to help with ambulation with her history of recent hip surgery and weakness.  She also will need expedient follow-up by primary care provider for follow-up of her anemia-renal function-and other medical issues.  [id number] note greater than 30 minutes spent on this discharge summary-greater than 50% of time spent coordinating and formulating a plan of care for numerous diagnoses   ADDENDUM  -03/31/2020-patient has seen orthopedics and they have given orders to discontinue Lovenox on discharge and do aspirin 325 mg twice daily for 14 days for DVT prophylaxis

## 2020-03-31 ENCOUNTER — Other Ambulatory Visit: Payer: Self-pay | Admitting: Internal Medicine

## 2020-03-31 MED ORDER — FUROSEMIDE 20 MG PO TABS: 20.0000 mg | ORAL_TABLET | ORAL | 0 refills | Status: DC

## 2020-03-31 MED ORDER — SPIRONOLACTONE 25 MG PO TABS: 12.5000 mg | ORAL_TABLET | Freq: Every day | ORAL | 0 refills | Status: DC

## 2020-03-31 MED ORDER — ASPIRIN 325 MG PO TABS: 325.0000 mg | ORAL_TABLET | Freq: Two times a day (BID) | ORAL | 0 refills | Status: AC

## 2020-03-31 MED ORDER — BYSTOLIC 20 MG PO TABS: 1.0000 | ORAL_TABLET | Freq: Every day | ORAL | 0 refills | Status: DC

## 2020-03-31 MED ORDER — ACETAMINOPHEN 500 MG PO TABS: 500.0000 mg | ORAL_TABLET | Freq: Four times a day (QID) | ORAL | 0 refills | Status: AC | PRN

## 2020-03-31 MED ORDER — ALLOPURINOL 100 MG PO TABS: 200.0000 mg | ORAL_TABLET | Freq: Every day | ORAL | 0 refills | Status: DC

## 2020-03-31 MED ORDER — HYDRALAZINE HCL 25 MG PO TABS: 25.0000 mg | ORAL_TABLET | Freq: Three times a day (TID) | ORAL | 0 refills | Status: AC

## 2020-03-31 MED ORDER — LEVOTHYROXINE SODIUM 75 MCG PO TABS: 75.0000 ug | ORAL_TABLET | Freq: Every day | ORAL | 0 refills | Status: AC

## 2020-03-31 MED ORDER — ATORVASTATIN CALCIUM 10 MG PO TABS: 10.0000 mg | ORAL_TABLET | Freq: Every day | ORAL | 0 refills | Status: AC

## 2020-03-31 MED ORDER — LANSOPRAZOLE 15 MG PO CPDR: 15.0000 mg | DELAYED_RELEASE_CAPSULE | Freq: Every day | ORAL | 0 refills | Status: AC

## 2020-04-02 DIAGNOSIS — M80051S Age-related osteoporosis with current pathological fracture, right femur, sequela: Secondary | ICD-10-CM | POA: Diagnosis not present

## 2020-04-02 DIAGNOSIS — Z87891 Personal history of nicotine dependence: Secondary | ICD-10-CM | POA: Diagnosis not present

## 2020-04-02 DIAGNOSIS — N1831 Chronic kidney disease, stage 3a: Secondary | ICD-10-CM | POA: Diagnosis not present

## 2020-04-02 DIAGNOSIS — M109 Gout, unspecified: Secondary | ICD-10-CM | POA: Diagnosis not present

## 2020-04-02 DIAGNOSIS — E039 Hypothyroidism, unspecified: Secondary | ICD-10-CM | POA: Diagnosis not present

## 2020-04-02 DIAGNOSIS — Z79899 Other long term (current) drug therapy: Secondary | ICD-10-CM | POA: Diagnosis not present

## 2020-04-02 DIAGNOSIS — K449 Diaphragmatic hernia without obstruction or gangrene: Secondary | ICD-10-CM | POA: Diagnosis not present

## 2020-04-02 DIAGNOSIS — E7800 Pure hypercholesterolemia, unspecified: Secondary | ICD-10-CM | POA: Diagnosis not present

## 2020-04-02 DIAGNOSIS — S72451D Displaced supracondylar fracture without intracondylar extension of lower end of right femur, subsequent encounter for closed fracture with routine healing: Secondary | ICD-10-CM | POA: Diagnosis not present

## 2020-04-02 DIAGNOSIS — Z7982 Long term (current) use of aspirin: Secondary | ICD-10-CM | POA: Diagnosis not present

## 2020-04-02 DIAGNOSIS — M47812 Spondylosis without myelopathy or radiculopathy, cervical region: Secondary | ICD-10-CM | POA: Diagnosis not present

## 2020-04-02 DIAGNOSIS — Z9181 History of falling: Secondary | ICD-10-CM | POA: Diagnosis not present

## 2020-04-02 DIAGNOSIS — Z8673 Personal history of transient ischemic attack (TIA), and cerebral infarction without residual deficits: Secondary | ICD-10-CM | POA: Diagnosis not present

## 2020-04-02 DIAGNOSIS — M1711 Unilateral primary osteoarthritis, right knee: Secondary | ICD-10-CM | POA: Diagnosis not present

## 2020-04-02 DIAGNOSIS — I700 Atherosclerosis of aorta: Secondary | ICD-10-CM | POA: Diagnosis not present

## 2020-04-02 DIAGNOSIS — M50322 Other cervical disc degeneration at C5-C6 level: Secondary | ICD-10-CM | POA: Diagnosis not present

## 2020-04-02 DIAGNOSIS — I129 Hypertensive chronic kidney disease with stage 1 through stage 4 chronic kidney disease, or unspecified chronic kidney disease: Secondary | ICD-10-CM | POA: Diagnosis not present

## 2020-04-05 ENCOUNTER — Other Ambulatory Visit: Payer: Self-pay | Admitting: Internal Medicine

## 2020-04-06 DIAGNOSIS — Z9181 History of falling: Secondary | ICD-10-CM | POA: Diagnosis not present

## 2020-04-06 DIAGNOSIS — Z87891 Personal history of nicotine dependence: Secondary | ICD-10-CM | POA: Diagnosis not present

## 2020-04-06 DIAGNOSIS — Z79899 Other long term (current) drug therapy: Secondary | ICD-10-CM | POA: Diagnosis not present

## 2020-04-06 DIAGNOSIS — E7800 Pure hypercholesterolemia, unspecified: Secondary | ICD-10-CM | POA: Diagnosis not present

## 2020-04-06 DIAGNOSIS — M109 Gout, unspecified: Secondary | ICD-10-CM | POA: Diagnosis not present

## 2020-04-06 DIAGNOSIS — K449 Diaphragmatic hernia without obstruction or gangrene: Secondary | ICD-10-CM | POA: Diagnosis not present

## 2020-04-06 DIAGNOSIS — Z7982 Long term (current) use of aspirin: Secondary | ICD-10-CM | POA: Diagnosis not present

## 2020-04-06 DIAGNOSIS — E039 Hypothyroidism, unspecified: Secondary | ICD-10-CM | POA: Diagnosis not present

## 2020-04-06 DIAGNOSIS — M50322 Other cervical disc degeneration at C5-C6 level: Secondary | ICD-10-CM | POA: Diagnosis not present

## 2020-04-06 DIAGNOSIS — M80051S Age-related osteoporosis with current pathological fracture, right femur, sequela: Secondary | ICD-10-CM | POA: Diagnosis not present

## 2020-04-06 DIAGNOSIS — S72451D Displaced supracondylar fracture without intracondylar extension of lower end of right femur, subsequent encounter for closed fracture with routine healing: Secondary | ICD-10-CM | POA: Diagnosis not present

## 2020-04-06 DIAGNOSIS — M1711 Unilateral primary osteoarthritis, right knee: Secondary | ICD-10-CM | POA: Diagnosis not present

## 2020-04-06 DIAGNOSIS — M47812 Spondylosis without myelopathy or radiculopathy, cervical region: Secondary | ICD-10-CM | POA: Diagnosis not present

## 2020-04-06 DIAGNOSIS — Z8673 Personal history of transient ischemic attack (TIA), and cerebral infarction without residual deficits: Secondary | ICD-10-CM | POA: Diagnosis not present

## 2020-04-06 DIAGNOSIS — I129 Hypertensive chronic kidney disease with stage 1 through stage 4 chronic kidney disease, or unspecified chronic kidney disease: Secondary | ICD-10-CM | POA: Diagnosis not present

## 2020-04-06 DIAGNOSIS — N1831 Chronic kidney disease, stage 3a: Secondary | ICD-10-CM | POA: Diagnosis not present

## 2020-04-06 DIAGNOSIS — I700 Atherosclerosis of aorta: Secondary | ICD-10-CM | POA: Diagnosis not present

## 2020-04-07 DIAGNOSIS — K449 Diaphragmatic hernia without obstruction or gangrene: Secondary | ICD-10-CM | POA: Diagnosis not present

## 2020-04-07 DIAGNOSIS — M47812 Spondylosis without myelopathy or radiculopathy, cervical region: Secondary | ICD-10-CM | POA: Diagnosis not present

## 2020-04-07 DIAGNOSIS — M109 Gout, unspecified: Secondary | ICD-10-CM | POA: Diagnosis not present

## 2020-04-07 DIAGNOSIS — Z9181 History of falling: Secondary | ICD-10-CM | POA: Diagnosis not present

## 2020-04-07 DIAGNOSIS — Z7982 Long term (current) use of aspirin: Secondary | ICD-10-CM | POA: Diagnosis not present

## 2020-04-07 DIAGNOSIS — M80051S Age-related osteoporosis with current pathological fracture, right femur, sequela: Secondary | ICD-10-CM | POA: Diagnosis not present

## 2020-04-07 DIAGNOSIS — Z79899 Other long term (current) drug therapy: Secondary | ICD-10-CM | POA: Diagnosis not present

## 2020-04-07 DIAGNOSIS — E7800 Pure hypercholesterolemia, unspecified: Secondary | ICD-10-CM | POA: Diagnosis not present

## 2020-04-07 DIAGNOSIS — N1831 Chronic kidney disease, stage 3a: Secondary | ICD-10-CM | POA: Diagnosis not present

## 2020-04-07 DIAGNOSIS — M50322 Other cervical disc degeneration at C5-C6 level: Secondary | ICD-10-CM | POA: Diagnosis not present

## 2020-04-07 DIAGNOSIS — Z8673 Personal history of transient ischemic attack (TIA), and cerebral infarction without residual deficits: Secondary | ICD-10-CM | POA: Diagnosis not present

## 2020-04-07 DIAGNOSIS — M1711 Unilateral primary osteoarthritis, right knee: Secondary | ICD-10-CM | POA: Diagnosis not present

## 2020-04-07 DIAGNOSIS — I129 Hypertensive chronic kidney disease with stage 1 through stage 4 chronic kidney disease, or unspecified chronic kidney disease: Secondary | ICD-10-CM | POA: Diagnosis not present

## 2020-04-07 DIAGNOSIS — S72451D Displaced supracondylar fracture without intracondylar extension of lower end of right femur, subsequent encounter for closed fracture with routine healing: Secondary | ICD-10-CM | POA: Diagnosis not present

## 2020-04-07 DIAGNOSIS — I700 Atherosclerosis of aorta: Secondary | ICD-10-CM | POA: Diagnosis not present

## 2020-04-07 DIAGNOSIS — Z87891 Personal history of nicotine dependence: Secondary | ICD-10-CM | POA: Diagnosis not present

## 2020-04-07 DIAGNOSIS — E039 Hypothyroidism, unspecified: Secondary | ICD-10-CM | POA: Diagnosis not present

## 2020-04-08 DIAGNOSIS — M109 Gout, unspecified: Secondary | ICD-10-CM | POA: Diagnosis not present

## 2020-04-08 DIAGNOSIS — S72451D Displaced supracondylar fracture without intracondylar extension of lower end of right femur, subsequent encounter for closed fracture with routine healing: Secondary | ICD-10-CM | POA: Diagnosis not present

## 2020-04-08 DIAGNOSIS — I129 Hypertensive chronic kidney disease with stage 1 through stage 4 chronic kidney disease, or unspecified chronic kidney disease: Secondary | ICD-10-CM | POA: Diagnosis not present

## 2020-04-08 DIAGNOSIS — M1711 Unilateral primary osteoarthritis, right knee: Secondary | ICD-10-CM | POA: Diagnosis not present

## 2020-04-13 DIAGNOSIS — Z79899 Other long term (current) drug therapy: Secondary | ICD-10-CM | POA: Diagnosis not present

## 2020-04-13 DIAGNOSIS — M80051S Age-related osteoporosis with current pathological fracture, right femur, sequela: Secondary | ICD-10-CM | POA: Diagnosis not present

## 2020-04-13 DIAGNOSIS — I129 Hypertensive chronic kidney disease with stage 1 through stage 4 chronic kidney disease, or unspecified chronic kidney disease: Secondary | ICD-10-CM | POA: Diagnosis not present

## 2020-04-13 DIAGNOSIS — M1711 Unilateral primary osteoarthritis, right knee: Secondary | ICD-10-CM | POA: Diagnosis not present

## 2020-04-13 DIAGNOSIS — N1831 Chronic kidney disease, stage 3a: Secondary | ICD-10-CM | POA: Diagnosis not present

## 2020-04-13 DIAGNOSIS — M50322 Other cervical disc degeneration at C5-C6 level: Secondary | ICD-10-CM | POA: Diagnosis not present

## 2020-04-13 DIAGNOSIS — S72451D Displaced supracondylar fracture without intracondylar extension of lower end of right femur, subsequent encounter for closed fracture with routine healing: Secondary | ICD-10-CM | POA: Diagnosis not present

## 2020-04-13 DIAGNOSIS — Z8673 Personal history of transient ischemic attack (TIA), and cerebral infarction without residual deficits: Secondary | ICD-10-CM | POA: Diagnosis not present

## 2020-04-13 DIAGNOSIS — E7800 Pure hypercholesterolemia, unspecified: Secondary | ICD-10-CM | POA: Diagnosis not present

## 2020-04-13 DIAGNOSIS — Z87891 Personal history of nicotine dependence: Secondary | ICD-10-CM | POA: Diagnosis not present

## 2020-04-13 DIAGNOSIS — E039 Hypothyroidism, unspecified: Secondary | ICD-10-CM | POA: Diagnosis not present

## 2020-04-13 DIAGNOSIS — I700 Atherosclerosis of aorta: Secondary | ICD-10-CM | POA: Diagnosis not present

## 2020-04-13 DIAGNOSIS — Z7982 Long term (current) use of aspirin: Secondary | ICD-10-CM | POA: Diagnosis not present

## 2020-04-13 DIAGNOSIS — M47812 Spondylosis without myelopathy or radiculopathy, cervical region: Secondary | ICD-10-CM | POA: Diagnosis not present

## 2020-04-13 DIAGNOSIS — K449 Diaphragmatic hernia without obstruction or gangrene: Secondary | ICD-10-CM | POA: Diagnosis not present

## 2020-04-13 DIAGNOSIS — Z9181 History of falling: Secondary | ICD-10-CM | POA: Diagnosis not present

## 2020-04-13 DIAGNOSIS — M109 Gout, unspecified: Secondary | ICD-10-CM | POA: Diagnosis not present

## 2020-04-20 DIAGNOSIS — K449 Diaphragmatic hernia without obstruction or gangrene: Secondary | ICD-10-CM | POA: Diagnosis not present

## 2020-04-20 DIAGNOSIS — M109 Gout, unspecified: Secondary | ICD-10-CM | POA: Diagnosis not present

## 2020-04-20 DIAGNOSIS — I129 Hypertensive chronic kidney disease with stage 1 through stage 4 chronic kidney disease, or unspecified chronic kidney disease: Secondary | ICD-10-CM | POA: Diagnosis not present

## 2020-04-20 DIAGNOSIS — Z79899 Other long term (current) drug therapy: Secondary | ICD-10-CM | POA: Diagnosis not present

## 2020-04-20 DIAGNOSIS — N1831 Chronic kidney disease, stage 3a: Secondary | ICD-10-CM | POA: Diagnosis not present

## 2020-04-20 DIAGNOSIS — Z87891 Personal history of nicotine dependence: Secondary | ICD-10-CM | POA: Diagnosis not present

## 2020-04-20 DIAGNOSIS — S72451D Displaced supracondylar fracture without intracondylar extension of lower end of right femur, subsequent encounter for closed fracture with routine healing: Secondary | ICD-10-CM | POA: Diagnosis not present

## 2020-04-20 DIAGNOSIS — E039 Hypothyroidism, unspecified: Secondary | ICD-10-CM | POA: Diagnosis not present

## 2020-04-20 DIAGNOSIS — I700 Atherosclerosis of aorta: Secondary | ICD-10-CM | POA: Diagnosis not present

## 2020-04-20 DIAGNOSIS — M50322 Other cervical disc degeneration at C5-C6 level: Secondary | ICD-10-CM | POA: Diagnosis not present

## 2020-04-20 DIAGNOSIS — Z9181 History of falling: Secondary | ICD-10-CM | POA: Diagnosis not present

## 2020-04-20 DIAGNOSIS — Z8673 Personal history of transient ischemic attack (TIA), and cerebral infarction without residual deficits: Secondary | ICD-10-CM | POA: Diagnosis not present

## 2020-04-20 DIAGNOSIS — M1711 Unilateral primary osteoarthritis, right knee: Secondary | ICD-10-CM | POA: Diagnosis not present

## 2020-04-20 DIAGNOSIS — E7800 Pure hypercholesterolemia, unspecified: Secondary | ICD-10-CM | POA: Diagnosis not present

## 2020-04-20 DIAGNOSIS — M80051S Age-related osteoporosis with current pathological fracture, right femur, sequela: Secondary | ICD-10-CM | POA: Diagnosis not present

## 2020-04-20 DIAGNOSIS — Z7982 Long term (current) use of aspirin: Secondary | ICD-10-CM | POA: Diagnosis not present

## 2020-04-20 DIAGNOSIS — M47812 Spondylosis without myelopathy or radiculopathy, cervical region: Secondary | ICD-10-CM | POA: Diagnosis not present

## 2020-04-21 DIAGNOSIS — M50322 Other cervical disc degeneration at C5-C6 level: Secondary | ICD-10-CM | POA: Diagnosis not present

## 2020-04-21 DIAGNOSIS — M47812 Spondylosis without myelopathy or radiculopathy, cervical region: Secondary | ICD-10-CM | POA: Diagnosis not present

## 2020-04-21 DIAGNOSIS — S72451D Displaced supracondylar fracture without intracondylar extension of lower end of right femur, subsequent encounter for closed fracture with routine healing: Secondary | ICD-10-CM | POA: Diagnosis not present

## 2020-04-21 DIAGNOSIS — Z87891 Personal history of nicotine dependence: Secondary | ICD-10-CM | POA: Diagnosis not present

## 2020-04-21 DIAGNOSIS — E039 Hypothyroidism, unspecified: Secondary | ICD-10-CM | POA: Diagnosis not present

## 2020-04-21 DIAGNOSIS — I700 Atherosclerosis of aorta: Secondary | ICD-10-CM | POA: Diagnosis not present

## 2020-04-21 DIAGNOSIS — M1711 Unilateral primary osteoarthritis, right knee: Secondary | ICD-10-CM | POA: Diagnosis not present

## 2020-04-21 DIAGNOSIS — Z9181 History of falling: Secondary | ICD-10-CM | POA: Diagnosis not present

## 2020-04-21 DIAGNOSIS — M109 Gout, unspecified: Secondary | ICD-10-CM | POA: Diagnosis not present

## 2020-04-21 DIAGNOSIS — Z8673 Personal history of transient ischemic attack (TIA), and cerebral infarction without residual deficits: Secondary | ICD-10-CM | POA: Diagnosis not present

## 2020-04-21 DIAGNOSIS — Z79899 Other long term (current) drug therapy: Secondary | ICD-10-CM | POA: Diagnosis not present

## 2020-04-21 DIAGNOSIS — M80051S Age-related osteoporosis with current pathological fracture, right femur, sequela: Secondary | ICD-10-CM | POA: Diagnosis not present

## 2020-04-21 DIAGNOSIS — K449 Diaphragmatic hernia without obstruction or gangrene: Secondary | ICD-10-CM | POA: Diagnosis not present

## 2020-04-21 DIAGNOSIS — Z7982 Long term (current) use of aspirin: Secondary | ICD-10-CM | POA: Diagnosis not present

## 2020-04-21 DIAGNOSIS — I129 Hypertensive chronic kidney disease with stage 1 through stage 4 chronic kidney disease, or unspecified chronic kidney disease: Secondary | ICD-10-CM | POA: Diagnosis not present

## 2020-04-21 DIAGNOSIS — E7800 Pure hypercholesterolemia, unspecified: Secondary | ICD-10-CM | POA: Diagnosis not present

## 2020-04-21 DIAGNOSIS — N1831 Chronic kidney disease, stage 3a: Secondary | ICD-10-CM | POA: Diagnosis not present

## 2020-04-25 ENCOUNTER — Other Ambulatory Visit: Payer: Self-pay | Admitting: Internal Medicine

## 2020-04-26 DIAGNOSIS — Z9181 History of falling: Secondary | ICD-10-CM | POA: Diagnosis not present

## 2020-04-26 DIAGNOSIS — Z8673 Personal history of transient ischemic attack (TIA), and cerebral infarction without residual deficits: Secondary | ICD-10-CM | POA: Diagnosis not present

## 2020-04-26 DIAGNOSIS — Z79899 Other long term (current) drug therapy: Secondary | ICD-10-CM | POA: Diagnosis not present

## 2020-04-26 DIAGNOSIS — Z7982 Long term (current) use of aspirin: Secondary | ICD-10-CM | POA: Diagnosis not present

## 2020-04-26 DIAGNOSIS — E7800 Pure hypercholesterolemia, unspecified: Secondary | ICD-10-CM | POA: Diagnosis not present

## 2020-04-26 DIAGNOSIS — N1831 Chronic kidney disease, stage 3a: Secondary | ICD-10-CM | POA: Diagnosis not present

## 2020-04-26 DIAGNOSIS — M1711 Unilateral primary osteoarthritis, right knee: Secondary | ICD-10-CM | POA: Diagnosis not present

## 2020-04-26 DIAGNOSIS — S72451D Displaced supracondylar fracture without intracondylar extension of lower end of right femur, subsequent encounter for closed fracture with routine healing: Secondary | ICD-10-CM | POA: Diagnosis not present

## 2020-04-26 DIAGNOSIS — M80051S Age-related osteoporosis with current pathological fracture, right femur, sequela: Secondary | ICD-10-CM | POA: Diagnosis not present

## 2020-04-26 DIAGNOSIS — M47812 Spondylosis without myelopathy or radiculopathy, cervical region: Secondary | ICD-10-CM | POA: Diagnosis not present

## 2020-04-26 DIAGNOSIS — Z87891 Personal history of nicotine dependence: Secondary | ICD-10-CM | POA: Diagnosis not present

## 2020-04-26 DIAGNOSIS — I700 Atherosclerosis of aorta: Secondary | ICD-10-CM | POA: Diagnosis not present

## 2020-04-26 DIAGNOSIS — I129 Hypertensive chronic kidney disease with stage 1 through stage 4 chronic kidney disease, or unspecified chronic kidney disease: Secondary | ICD-10-CM | POA: Diagnosis not present

## 2020-04-26 DIAGNOSIS — E039 Hypothyroidism, unspecified: Secondary | ICD-10-CM | POA: Diagnosis not present

## 2020-04-26 DIAGNOSIS — M50322 Other cervical disc degeneration at C5-C6 level: Secondary | ICD-10-CM | POA: Diagnosis not present

## 2020-04-26 DIAGNOSIS — K449 Diaphragmatic hernia without obstruction or gangrene: Secondary | ICD-10-CM | POA: Diagnosis not present

## 2020-04-26 DIAGNOSIS — M109 Gout, unspecified: Secondary | ICD-10-CM | POA: Diagnosis not present

## 2020-04-27 DIAGNOSIS — S72451D Displaced supracondylar fracture without intracondylar extension of lower end of right femur, subsequent encounter for closed fracture with routine healing: Secondary | ICD-10-CM | POA: Diagnosis not present

## 2020-04-27 DIAGNOSIS — S72451 Displaced supracondylar fracture without intracondylar extension of lower end of right femur: Secondary | ICD-10-CM | POA: Diagnosis not present

## 2020-04-29 DIAGNOSIS — Z79899 Other long term (current) drug therapy: Secondary | ICD-10-CM | POA: Diagnosis not present

## 2020-04-29 DIAGNOSIS — I700 Atherosclerosis of aorta: Secondary | ICD-10-CM | POA: Diagnosis not present

## 2020-04-29 DIAGNOSIS — Z8673 Personal history of transient ischemic attack (TIA), and cerebral infarction without residual deficits: Secondary | ICD-10-CM | POA: Diagnosis not present

## 2020-04-29 DIAGNOSIS — S72451D Displaced supracondylar fracture without intracondylar extension of lower end of right femur, subsequent encounter for closed fracture with routine healing: Secondary | ICD-10-CM | POA: Diagnosis not present

## 2020-04-29 DIAGNOSIS — K449 Diaphragmatic hernia without obstruction or gangrene: Secondary | ICD-10-CM | POA: Diagnosis not present

## 2020-04-29 DIAGNOSIS — M50322 Other cervical disc degeneration at C5-C6 level: Secondary | ICD-10-CM | POA: Diagnosis not present

## 2020-04-29 DIAGNOSIS — M80051S Age-related osteoporosis with current pathological fracture, right femur, sequela: Secondary | ICD-10-CM | POA: Diagnosis not present

## 2020-04-29 DIAGNOSIS — E7800 Pure hypercholesterolemia, unspecified: Secondary | ICD-10-CM | POA: Diagnosis not present

## 2020-04-29 DIAGNOSIS — Z87891 Personal history of nicotine dependence: Secondary | ICD-10-CM | POA: Diagnosis not present

## 2020-04-29 DIAGNOSIS — M1711 Unilateral primary osteoarthritis, right knee: Secondary | ICD-10-CM | POA: Diagnosis not present

## 2020-04-29 DIAGNOSIS — M47812 Spondylosis without myelopathy or radiculopathy, cervical region: Secondary | ICD-10-CM | POA: Diagnosis not present

## 2020-04-29 DIAGNOSIS — Z9181 History of falling: Secondary | ICD-10-CM | POA: Diagnosis not present

## 2020-04-29 DIAGNOSIS — M109 Gout, unspecified: Secondary | ICD-10-CM | POA: Diagnosis not present

## 2020-04-29 DIAGNOSIS — I129 Hypertensive chronic kidney disease with stage 1 through stage 4 chronic kidney disease, or unspecified chronic kidney disease: Secondary | ICD-10-CM | POA: Diagnosis not present

## 2020-04-29 DIAGNOSIS — Z7982 Long term (current) use of aspirin: Secondary | ICD-10-CM | POA: Diagnosis not present

## 2020-04-29 DIAGNOSIS — E039 Hypothyroidism, unspecified: Secondary | ICD-10-CM | POA: Diagnosis not present

## 2020-04-29 DIAGNOSIS — N1831 Chronic kidney disease, stage 3a: Secondary | ICD-10-CM | POA: Diagnosis not present

## 2020-05-05 DIAGNOSIS — S72451D Displaced supracondylar fracture without intracondylar extension of lower end of right femur, subsequent encounter for closed fracture with routine healing: Secondary | ICD-10-CM | POA: Diagnosis not present

## 2020-05-05 DIAGNOSIS — E7800 Pure hypercholesterolemia, unspecified: Secondary | ICD-10-CM | POA: Diagnosis not present

## 2020-05-05 DIAGNOSIS — M1711 Unilateral primary osteoarthritis, right knee: Secondary | ICD-10-CM | POA: Diagnosis not present

## 2020-05-05 DIAGNOSIS — M109 Gout, unspecified: Secondary | ICD-10-CM | POA: Diagnosis not present

## 2020-05-05 DIAGNOSIS — Z8673 Personal history of transient ischemic attack (TIA), and cerebral infarction without residual deficits: Secondary | ICD-10-CM | POA: Diagnosis not present

## 2020-05-05 DIAGNOSIS — M47812 Spondylosis without myelopathy or radiculopathy, cervical region: Secondary | ICD-10-CM | POA: Diagnosis not present

## 2020-05-05 DIAGNOSIS — K449 Diaphragmatic hernia without obstruction or gangrene: Secondary | ICD-10-CM | POA: Diagnosis not present

## 2020-05-05 DIAGNOSIS — Z79899 Other long term (current) drug therapy: Secondary | ICD-10-CM | POA: Diagnosis not present

## 2020-05-05 DIAGNOSIS — I700 Atherosclerosis of aorta: Secondary | ICD-10-CM | POA: Diagnosis not present

## 2020-05-05 DIAGNOSIS — Z9181 History of falling: Secondary | ICD-10-CM | POA: Diagnosis not present

## 2020-05-05 DIAGNOSIS — M50322 Other cervical disc degeneration at C5-C6 level: Secondary | ICD-10-CM | POA: Diagnosis not present

## 2020-05-05 DIAGNOSIS — Z87891 Personal history of nicotine dependence: Secondary | ICD-10-CM | POA: Diagnosis not present

## 2020-05-05 DIAGNOSIS — N1831 Chronic kidney disease, stage 3a: Secondary | ICD-10-CM | POA: Diagnosis not present

## 2020-05-05 DIAGNOSIS — I129 Hypertensive chronic kidney disease with stage 1 through stage 4 chronic kidney disease, or unspecified chronic kidney disease: Secondary | ICD-10-CM | POA: Diagnosis not present

## 2020-05-05 DIAGNOSIS — E039 Hypothyroidism, unspecified: Secondary | ICD-10-CM | POA: Diagnosis not present

## 2020-05-05 DIAGNOSIS — Z7982 Long term (current) use of aspirin: Secondary | ICD-10-CM | POA: Diagnosis not present

## 2020-05-05 DIAGNOSIS — M80051S Age-related osteoporosis with current pathological fracture, right femur, sequela: Secondary | ICD-10-CM | POA: Diagnosis not present

## 2020-05-12 DIAGNOSIS — I129 Hypertensive chronic kidney disease with stage 1 through stage 4 chronic kidney disease, or unspecified chronic kidney disease: Secondary | ICD-10-CM | POA: Diagnosis not present

## 2020-05-12 DIAGNOSIS — E7800 Pure hypercholesterolemia, unspecified: Secondary | ICD-10-CM | POA: Diagnosis not present

## 2020-05-12 DIAGNOSIS — Z7982 Long term (current) use of aspirin: Secondary | ICD-10-CM | POA: Diagnosis not present

## 2020-05-12 DIAGNOSIS — M80051S Age-related osteoporosis with current pathological fracture, right femur, sequela: Secondary | ICD-10-CM | POA: Diagnosis not present

## 2020-05-12 DIAGNOSIS — M47812 Spondylosis without myelopathy or radiculopathy, cervical region: Secondary | ICD-10-CM | POA: Diagnosis not present

## 2020-05-12 DIAGNOSIS — K449 Diaphragmatic hernia without obstruction or gangrene: Secondary | ICD-10-CM | POA: Diagnosis not present

## 2020-05-12 DIAGNOSIS — M50322 Other cervical disc degeneration at C5-C6 level: Secondary | ICD-10-CM | POA: Diagnosis not present

## 2020-05-12 DIAGNOSIS — I700 Atherosclerosis of aorta: Secondary | ICD-10-CM | POA: Diagnosis not present

## 2020-05-12 DIAGNOSIS — M1711 Unilateral primary osteoarthritis, right knee: Secondary | ICD-10-CM | POA: Diagnosis not present

## 2020-05-12 DIAGNOSIS — Z87891 Personal history of nicotine dependence: Secondary | ICD-10-CM | POA: Diagnosis not present

## 2020-05-12 DIAGNOSIS — M109 Gout, unspecified: Secondary | ICD-10-CM | POA: Diagnosis not present

## 2020-05-12 DIAGNOSIS — N1831 Chronic kidney disease, stage 3a: Secondary | ICD-10-CM | POA: Diagnosis not present

## 2020-05-12 DIAGNOSIS — S72451D Displaced supracondylar fracture without intracondylar extension of lower end of right femur, subsequent encounter for closed fracture with routine healing: Secondary | ICD-10-CM | POA: Diagnosis not present

## 2020-05-12 DIAGNOSIS — Z8673 Personal history of transient ischemic attack (TIA), and cerebral infarction without residual deficits: Secondary | ICD-10-CM | POA: Diagnosis not present

## 2020-05-12 DIAGNOSIS — E039 Hypothyroidism, unspecified: Secondary | ICD-10-CM | POA: Diagnosis not present

## 2020-05-12 DIAGNOSIS — Z9181 History of falling: Secondary | ICD-10-CM | POA: Diagnosis not present

## 2020-05-12 DIAGNOSIS — Z79899 Other long term (current) drug therapy: Secondary | ICD-10-CM | POA: Diagnosis not present

## 2020-05-25 DIAGNOSIS — S72451D Displaced supracondylar fracture without intracondylar extension of lower end of right femur, subsequent encounter for closed fracture with routine healing: Secondary | ICD-10-CM | POA: Diagnosis not present

## 2020-06-04 DIAGNOSIS — N390 Urinary tract infection, site not specified: Secondary | ICD-10-CM | POA: Diagnosis not present

## 2020-06-04 DIAGNOSIS — R300 Dysuria: Secondary | ICD-10-CM | POA: Diagnosis not present

## 2020-06-04 DIAGNOSIS — S72354A Nondisplaced comminuted fracture of shaft of right femur, initial encounter for closed fracture: Secondary | ICD-10-CM | POA: Diagnosis not present

## 2020-06-04 DIAGNOSIS — E789 Disorder of lipoprotein metabolism, unspecified: Secondary | ICD-10-CM | POA: Diagnosis not present

## 2020-06-04 DIAGNOSIS — E538 Deficiency of other specified B group vitamins: Secondary | ICD-10-CM | POA: Diagnosis not present

## 2020-06-04 DIAGNOSIS — I10 Essential (primary) hypertension: Secondary | ICD-10-CM | POA: Diagnosis not present

## 2020-06-07 DIAGNOSIS — N390 Urinary tract infection, site not specified: Secondary | ICD-10-CM | POA: Diagnosis not present

## 2020-07-20 DIAGNOSIS — S72451D Displaced supracondylar fracture without intracondylar extension of lower end of right femur, subsequent encounter for closed fracture with routine healing: Secondary | ICD-10-CM | POA: Diagnosis not present

## 2020-07-26 DIAGNOSIS — Z23 Encounter for immunization: Secondary | ICD-10-CM | POA: Diagnosis not present

## 2020-08-16 DIAGNOSIS — H8110 Benign paroxysmal vertigo, unspecified ear: Secondary | ICD-10-CM | POA: Diagnosis not present

## 2020-08-23 DIAGNOSIS — R300 Dysuria: Secondary | ICD-10-CM | POA: Diagnosis not present

## 2020-08-24 DIAGNOSIS — M810 Age-related osteoporosis without current pathological fracture: Secondary | ICD-10-CM | POA: Diagnosis not present

## 2020-08-30 DIAGNOSIS — I10 Essential (primary) hypertension: Secondary | ICD-10-CM | POA: Diagnosis not present

## 2020-08-30 DIAGNOSIS — E790 Hyperuricemia without signs of inflammatory arthritis and tophaceous disease: Secondary | ICD-10-CM | POA: Diagnosis not present

## 2020-08-30 DIAGNOSIS — E538 Deficiency of other specified B group vitamins: Secondary | ICD-10-CM | POA: Diagnosis not present

## 2020-08-30 DIAGNOSIS — E039 Hypothyroidism, unspecified: Secondary | ICD-10-CM | POA: Diagnosis not present

## 2020-08-30 DIAGNOSIS — E789 Disorder of lipoprotein metabolism, unspecified: Secondary | ICD-10-CM | POA: Diagnosis not present

## 2020-08-30 DIAGNOSIS — M810 Age-related osteoporosis without current pathological fracture: Secondary | ICD-10-CM | POA: Diagnosis not present

## 2020-08-30 DIAGNOSIS — N1832 Chronic kidney disease, stage 3b: Secondary | ICD-10-CM | POA: Diagnosis not present

## 2020-09-14 DIAGNOSIS — E538 Deficiency of other specified B group vitamins: Secondary | ICD-10-CM | POA: Diagnosis not present

## 2020-09-14 DIAGNOSIS — E7800 Pure hypercholesterolemia, unspecified: Secondary | ICD-10-CM | POA: Diagnosis not present

## 2020-09-14 DIAGNOSIS — I10 Essential (primary) hypertension: Secondary | ICD-10-CM | POA: Diagnosis not present

## 2020-09-14 DIAGNOSIS — I951 Orthostatic hypotension: Secondary | ICD-10-CM | POA: Diagnosis not present

## 2020-09-14 DIAGNOSIS — R42 Dizziness and giddiness: Secondary | ICD-10-CM | POA: Diagnosis not present

## 2020-09-14 DIAGNOSIS — M810 Age-related osteoporosis without current pathological fracture: Secondary | ICD-10-CM | POA: Diagnosis not present

## 2020-09-14 DIAGNOSIS — E039 Hypothyroidism, unspecified: Secondary | ICD-10-CM | POA: Diagnosis not present

## 2020-10-12 DIAGNOSIS — R293 Abnormal posture: Secondary | ICD-10-CM | POA: Diagnosis not present

## 2020-10-12 DIAGNOSIS — H8113 Benign paroxysmal vertigo, bilateral: Secondary | ICD-10-CM | POA: Diagnosis not present

## 2020-10-12 DIAGNOSIS — S72451D Displaced supracondylar fracture without intracondylar extension of lower end of right femur, subsequent encounter for closed fracture with routine healing: Secondary | ICD-10-CM | POA: Diagnosis not present

## 2020-10-12 DIAGNOSIS — M4003 Postural kyphosis, cervicothoracic region: Secondary | ICD-10-CM | POA: Diagnosis not present

## 2020-10-12 DIAGNOSIS — R262 Difficulty in walking, not elsewhere classified: Secondary | ICD-10-CM | POA: Diagnosis not present

## 2020-10-13 ENCOUNTER — Other Ambulatory Visit: Payer: Self-pay | Admitting: Student

## 2020-10-13 DIAGNOSIS — S72451D Displaced supracondylar fracture without intracondylar extension of lower end of right femur, subsequent encounter for closed fracture with routine healing: Secondary | ICD-10-CM

## 2020-10-18 ENCOUNTER — Ambulatory Visit
Admission: RE | Admit: 2020-10-18 | Discharge: 2020-10-18 | Disposition: A | Discharge status: Home / Self Care | Payer: PPO | Source: Ambulatory Visit | Attending: Student | Admitting: Student

## 2020-10-18 DIAGNOSIS — M1711 Unilateral primary osteoarthritis, right knee: Secondary | ICD-10-CM | POA: Diagnosis not present

## 2020-10-18 DIAGNOSIS — M25461 Effusion, right knee: Secondary | ICD-10-CM | POA: Diagnosis not present

## 2020-10-18 DIAGNOSIS — S72401A Unspecified fracture of lower end of right femur, initial encounter for closed fracture: Secondary | ICD-10-CM | POA: Diagnosis not present

## 2020-10-18 DIAGNOSIS — S72451D Displaced supracondylar fracture without intracondylar extension of lower end of right femur, subsequent encounter for closed fracture with routine healing: Secondary | ICD-10-CM

## 2020-10-19 DIAGNOSIS — M4003 Postural kyphosis, cervicothoracic region: Secondary | ICD-10-CM | POA: Diagnosis not present

## 2020-10-19 DIAGNOSIS — H8113 Benign paroxysmal vertigo, bilateral: Secondary | ICD-10-CM | POA: Diagnosis not present

## 2020-10-19 DIAGNOSIS — R293 Abnormal posture: Secondary | ICD-10-CM | POA: Diagnosis not present

## 2020-10-19 DIAGNOSIS — R262 Difficulty in walking, not elsewhere classified: Secondary | ICD-10-CM | POA: Diagnosis not present

## 2020-10-26 DIAGNOSIS — E538 Deficiency of other specified B group vitamins: Secondary | ICD-10-CM | POA: Diagnosis not present

## 2020-10-26 DIAGNOSIS — R42 Dizziness and giddiness: Secondary | ICD-10-CM | POA: Diagnosis not present

## 2020-10-26 DIAGNOSIS — I951 Orthostatic hypotension: Secondary | ICD-10-CM | POA: Diagnosis not present

## 2020-10-26 DIAGNOSIS — E7800 Pure hypercholesterolemia, unspecified: Secondary | ICD-10-CM | POA: Diagnosis not present

## 2020-10-26 DIAGNOSIS — I10 Essential (primary) hypertension: Secondary | ICD-10-CM | POA: Diagnosis not present

## 2020-10-26 DIAGNOSIS — E039 Hypothyroidism, unspecified: Secondary | ICD-10-CM | POA: Diagnosis not present

## 2020-11-25 DIAGNOSIS — E039 Hypothyroidism, unspecified: Secondary | ICD-10-CM | POA: Diagnosis not present

## 2020-11-25 DIAGNOSIS — E790 Hyperuricemia without signs of inflammatory arthritis and tophaceous disease: Secondary | ICD-10-CM | POA: Diagnosis not present

## 2020-11-25 DIAGNOSIS — R739 Hyperglycemia, unspecified: Secondary | ICD-10-CM | POA: Diagnosis not present

## 2020-11-25 DIAGNOSIS — I10 Essential (primary) hypertension: Secondary | ICD-10-CM | POA: Diagnosis not present

## 2020-11-25 DIAGNOSIS — E538 Deficiency of other specified B group vitamins: Secondary | ICD-10-CM | POA: Diagnosis not present

## 2020-11-25 DIAGNOSIS — E789 Disorder of lipoprotein metabolism, unspecified: Secondary | ICD-10-CM | POA: Diagnosis not present

## 2020-11-25 DIAGNOSIS — N1832 Chronic kidney disease, stage 3b: Secondary | ICD-10-CM | POA: Diagnosis not present

## 2020-12-13 DIAGNOSIS — H524 Presbyopia: Secondary | ICD-10-CM | POA: Diagnosis not present

## 2020-12-13 DIAGNOSIS — H5213 Myopia, bilateral: Secondary | ICD-10-CM | POA: Diagnosis not present

## 2020-12-13 DIAGNOSIS — H18513 Endothelial corneal dystrophy, bilateral: Secondary | ICD-10-CM | POA: Diagnosis not present

## 2020-12-13 DIAGNOSIS — H52203 Unspecified astigmatism, bilateral: Secondary | ICD-10-CM | POA: Diagnosis not present

## 2020-12-13 DIAGNOSIS — Z961 Presence of intraocular lens: Secondary | ICD-10-CM | POA: Diagnosis not present

## 2020-12-21 DIAGNOSIS — S72451D Displaced supracondylar fracture without intracondylar extension of lower end of right femur, subsequent encounter for closed fracture with routine healing: Secondary | ICD-10-CM | POA: Diagnosis not present

## 2020-12-25 ENCOUNTER — Encounter (HOSPITAL_COMMUNITY): Payer: Self-pay

## 2020-12-25 ENCOUNTER — Emergency Department (HOSPITAL_COMMUNITY)
Admission: EM | Admit: 2020-12-25 | Discharge: 2020-12-25 | Disposition: A | Discharge status: Home / Self Care | Payer: PPO | Attending: Emergency Medicine | Admitting: Emergency Medicine

## 2020-12-25 ENCOUNTER — Other Ambulatory Visit: Payer: Self-pay

## 2020-12-25 DIAGNOSIS — Z87891 Personal history of nicotine dependence: Secondary | ICD-10-CM | POA: Diagnosis not present

## 2020-12-25 DIAGNOSIS — Z79899 Other long term (current) drug therapy: Secondary | ICD-10-CM | POA: Diagnosis not present

## 2020-12-25 DIAGNOSIS — R443 Hallucinations, unspecified: Secondary | ICD-10-CM | POA: Insufficient documentation

## 2020-12-25 DIAGNOSIS — I10 Essential (primary) hypertension: Secondary | ICD-10-CM | POA: Insufficient documentation

## 2020-12-25 DIAGNOSIS — E039 Hypothyroidism, unspecified: Secondary | ICD-10-CM | POA: Insufficient documentation

## 2020-12-25 NOTE — ED Provider Notes (Signed)
[facility] DEPT Provider Note   CSN: [id number] Arrival date & time: 12/25/20  1045     History Chief Complaint  Patient presents with  . Hallucinations    Jennifer Patel is a 87 y.o. female.  87 year old female who presents with chronic hallucinations.  Patient seen by a home health physician yesterday for a routine checkup.  Mention to the provider that she did have hallucinations.  She did not mention how long she had been having them for.  Patient states that this has been going on for quite some time and her daughter confirms this.  States that it is been unchanged.  Denies any recent medication changes.  No urinary symptoms.  No fever or chills.  No recent illnesses.  Physician who came to her house requested that she come to the ED.  Patient has no complaints at this time        Past Medical History:  Diagnosis Date  . High cholesterol   . Hypertension   . Renal insufficiency   . Thyroid disease     Patient Active Problem List   Diagnosis Date Noted  . Anemia 03/12/2020  . Femur fracture, right ([facility]) 03/11/2020  . Closed bicondylar fracture of distal end of right femur ([facility])   . Resistant hypertension 11/13/2015  . AKI (acute kidney injury) ([facility]) 05/22/2015  . Abnormal EKG 05/22/2015  . Hypertension 05/22/2015  . Hypothyroidism 05/22/2015  . Hyperlipidemia 05/22/2015  . Neck pain 05/22/2015    Past Surgical History:  Procedure Laterality Date  . ABDOMINAL HYSTERECTOMY    . CHOLECYSTECTOMY    . ORIF FEMUR FRACTURE Right 03/12/2020   Procedure: OPEN REDUCTION INTERNAL FIXATION (ORIF) DISTAL FEMUR FRACTURE;  Surgeon: Shona Needles, MD;  Location: [facility];  Service: Orthopedics;  Laterality: Right;  . PERIPHERAL VASCULAR CATHETERIZATION N/A 11/16/2015   Procedure: Renal Angiography;  Surgeon: Adrian Prows, MD;  Location: [facility];  Service: Cardiovascular;  Laterality: N/A;     OB History   No obstetric history on file.      History reviewed. No pertinent family history.  Social History   Tobacco Use  . Smoking status: Former Research scientist (life sciences)  . Smokeless tobacco: Never Used  Substance Use Topics  . Alcohol use: No    Alcohol/week: 0.0 standard drinks  . Drug use: No    Home Medications Prior to Admission medications   Medication Sig Start Date End Date Taking? Authorizing Provider  acetaminophen (TYLENOL) 500 MG tablet Take 1 tablet (500 mg total) by mouth every 6 (six) hours as needed (pain). 03/31/20   Granville Lewis C, PA-C  allopurinol (ZYLOPRIM) 100 MG tablet Take 2 tablets (200 mg total) by mouth daily. 03/31/20   Granville Lewis C, PA-C  atorvastatin (LIPITOR) 10 MG tablet Take 1 tablet (10 mg total) by mouth daily. Reported on 11/11/2015 03/31/20   Wille Celeste, PA-C  Cyanocobalamin (VITAMIN B-12 IJ) Inject as directed every 30 (thirty) days. Next injection due 05/24/15 at Dr. Eugenio Hoes office    [provider]  enoxaparin (LOVENOX) 30 MG/0.3ML injection Inject 0.3 mLs (30 mg total) into the skin daily. 03/15/20 04/14/20  Delray Alt, PA-C  furosemide (LASIX) 20 MG tablet Take 1 tablet (20 mg total) by mouth every Monday, Wednesday, and Friday. 03/31/20   Granville Lewis C, PA-C  hydrALAZINE (APRESOLINE) 25 MG tablet Take 1 tablet (25 mg total) by mouth 3 (three) times daily. 03/31/20   Wille Celeste, PA-C  HYDROcodone-acetaminophen (NORCO/VICODIN) 5-325 MG tablet Take 1 tablet by mouth every 6 (six) hours as needed for severe pain. 03/14/20   Delray Alt, PA-C  lansoprazole (PREVACID) 15 MG capsule Take 1 capsule (15 mg total) by mouth daily at 12 noon. 03/31/20   Wille Celeste, PA-C  levothyroxine (SYNTHROID) 75 MCG tablet Take 1 tablet (75 mcg total) by mouth daily. 03/31/20   Granville Lewis C, PA-C  Nebivolol HCl (BYSTOLIC) 20 MG TABS Take 1 tablet (20 mg total) by mouth daily. 03/31/20   Granville Lewis C, PA-C  Polyethyl Glycol-Propyl Glycol (SYSTANE OP) Place 1 drop into both eyes 2 (two) times daily as needed  (dry eyes).    [provider]  spironolactone (ALDACTONE) 25 MG tablet Take 0.5 tablets (12.5 mg total) by mouth daily. 03/31/20   Granville Lewis C, PA-C    Allergies    Clarithromycin and Prednisone  Review of Systems   Review of Systems  All other systems reviewed and are negative.   Physical Exam Updated Vital Signs BP (!) 127/93 (BP Location: Left Arm)   Pulse 60   Temp 98.2 F (36.8 C) (Oral)   Resp (!) 30   SpO2 98%   Physical Exam Vitals and nursing note reviewed.  Constitutional:      General: She is not in acute distress.    Appearance: Normal appearance. She is well-developed and well-nourished. She is not toxic-appearing.  HENT:     Head: Normocephalic and atraumatic.  Eyes:     General: Lids are normal.     Extraocular Movements: EOM normal.     Conjunctiva/sclera: Conjunctivae normal.     Pupils: Pupils are equal, round, and reactive to light.  Neck:     Thyroid: No thyroid mass.     Trachea: No tracheal deviation.  Cardiovascular:     Rate and Rhythm: Normal rate and regular rhythm.     Heart sounds: Normal heart sounds. No murmur heard. No gallop.   Pulmonary:     Effort: Pulmonary effort is normal. No respiratory distress.     Breath sounds: Normal breath sounds. No stridor. No decreased breath sounds, wheezing, rhonchi or rales.  Abdominal:     General: Bowel sounds are normal. There is no distension.     Palpations: Abdomen is soft.     Tenderness: There is no abdominal tenderness. There is no CVA tenderness or rebound.  Musculoskeletal:        General: No tenderness or edema. Normal range of motion.     Cervical back: Normal range of motion and neck supple.  Skin:    General: Skin is warm and dry.     Findings: No abrasion or rash.  Neurological:     General: No focal deficit present.     Mental Status: She is alert and oriented to person, place, and time.     GCS: GCS eye subscore is 4. GCS verbal subscore is 5. GCS motor subscore is  6.     Cranial Nerves: No cranial nerve deficit.     Sensory: No sensory deficit.     Deep Tendon Reflexes: Strength normal.  Psychiatric:        Attention and Perception: Attention normal.        Mood and Affect: Mood and affect and mood normal.        Speech: Speech normal.        Behavior: Behavior normal.     ED Results / Procedures / Treatments  Labs (all labs ordered are listed, but only abnormal results are displayed) Labs Reviewed - No data to display  EKG None  Radiology No results found.  Procedures Procedures   Medications Ordered in ED Medications - No data to display  ED Course  I have reviewed the triage vital signs and the nursing notes.  Pertinent labs & imaging results that were available during my care of the patient were reviewed by me and considered in my medical decision making (see chart for details).    MDM Rules/Calculators/A&P                          Patient is at her neurological baseline at this time.  She is not having any hallucinations.  Patient is scheduled to see a new physician next week and she was instructed to keep this appointment.  No indication for labs or imaging at this time Final Clinical Impression(s) / ED Diagnoses Final diagnoses:  None    Rx / DC Orders ED Discharge Orders    None       Lacretia Leigh, MD 12/25/20 1158

## 2020-12-25 NOTE — ED Notes (Signed)
Bed alarm on Yellow safety socks Yellow fall risk armband  Fall risk sign outside door

## 2020-12-25 NOTE — Discharge Instructions (Addendum)
Follow-up with your primary care doctor as we discussed °

## 2020-12-25 NOTE — ED Triage Notes (Signed)
Pt reports having a fall back in May 2021 and hitting her head. Pt states her PCP sent her here due to new onset confusion and hallucinations. Pt states that she has been having visual hallucinations of family members that have passed away. Pt is A&O at this time.

## 2020-12-28 DIAGNOSIS — D519 Vitamin B12 deficiency anemia, unspecified: Secondary | ICD-10-CM | POA: Diagnosis not present

## 2020-12-28 DIAGNOSIS — M810 Age-related osteoporosis without current pathological fracture: Secondary | ICD-10-CM | POA: Diagnosis not present

## 2020-12-28 DIAGNOSIS — E7800 Pure hypercholesterolemia, unspecified: Secondary | ICD-10-CM | POA: Diagnosis not present

## 2020-12-28 DIAGNOSIS — I129 Hypertensive chronic kidney disease with stage 1 through stage 4 chronic kidney disease, or unspecified chronic kidney disease: Secondary | ICD-10-CM | POA: Diagnosis not present

## 2020-12-28 DIAGNOSIS — R443 Hallucinations, unspecified: Secondary | ICD-10-CM | POA: Diagnosis not present

## 2020-12-28 DIAGNOSIS — E039 Hypothyroidism, unspecified: Secondary | ICD-10-CM | POA: Diagnosis not present

## 2021-01-25 ENCOUNTER — Ambulatory Visit: Payer: PPO | Admitting: Podiatry

## 2021-01-25 ENCOUNTER — Encounter: Payer: Self-pay | Admitting: Podiatry

## 2021-01-25 ENCOUNTER — Other Ambulatory Visit: Payer: Self-pay | Admitting: Family Medicine

## 2021-01-25 ENCOUNTER — Other Ambulatory Visit: Payer: Self-pay

## 2021-01-25 DIAGNOSIS — B351 Tinea unguium: Secondary | ICD-10-CM | POA: Diagnosis not present

## 2021-01-25 DIAGNOSIS — N179 Acute kidney failure, unspecified: Secondary | ICD-10-CM

## 2021-01-25 DIAGNOSIS — L84 Corns and callosities: Secondary | ICD-10-CM

## 2021-01-25 DIAGNOSIS — M79675 Pain in left toe(s): Secondary | ICD-10-CM

## 2021-01-25 DIAGNOSIS — M79674 Pain in right toe(s): Secondary | ICD-10-CM | POA: Diagnosis not present

## 2021-01-25 DIAGNOSIS — R443 Hallucinations, unspecified: Secondary | ICD-10-CM

## 2021-01-25 NOTE — Progress Notes (Signed)
This patient returns to my office for at risk foot care.  This patient requires this care by a professional since this patient will be at risk due to having AKI.    This patient is unable to trim callus  herself since the patient cannot reach her feet.These callus  are painful walking and wearing shoes.  This patient presents for at risk foot care today.  General Appearance  Alert, conversant and in no acute stress.  Vascular  Dorsalis pedis and posterior tibial  pulses are palpable  bilaterally.  Capillary return is within normal limits  bilaterally. Temperature is within normal limits  bilaterally.  Neurologic  Senn-Weinstein monofilament wire test within normal limits  bilaterally. Muscle power within normal limits bilaterally.  Nails Thick disfigured discolored nails with subungual debris  from hallux to fifth toes bilaterally. No evidence of bacterial infection or drainage bilaterally.  Orthopedic  No limitations of motion  feet .  No crepitus or effusions noted.  No bony pathology or digital deformities noted.  Plantar flexed fifth met  B/L  Skin  normotropic skin with  noted bilaterally.  No signs of infections or ulcers noted.  Callus noted sub 1,5  Left foot and sub 5 right foot  Callus  B/L.  Consent was obtained for treatment procedures..Debridement of callus with # 15 blade and then  filed with dremel without incident.    Return office visit   4 months                  Told patient to return for periodic foot care and evaluation due to potential at risk complications.   Gardiner Barefoot DPM

## 2021-01-31 DIAGNOSIS — D519 Vitamin B12 deficiency anemia, unspecified: Secondary | ICD-10-CM | POA: Diagnosis not present

## 2021-02-11 ENCOUNTER — Ambulatory Visit
Admission: RE | Admit: 2021-02-11 | Discharge: 2021-02-11 | Disposition: A | Discharge status: Home / Self Care | Payer: PPO | Source: Ambulatory Visit | Attending: Family Medicine | Admitting: Family Medicine

## 2021-02-11 DIAGNOSIS — R262 Difficulty in walking, not elsewhere classified: Secondary | ICD-10-CM | POA: Diagnosis not present

## 2021-02-11 DIAGNOSIS — R443 Hallucinations, unspecified: Secondary | ICD-10-CM

## 2021-02-11 MED ORDER — GADOBENATE DIMEGLUMINE 529 MG/ML IV SOLN
8.0000 mL | Freq: Once | INTRAVENOUS | Status: AC | PRN
  Administered 2021-02-11: 8 mL via INTRAVENOUS

## 2021-03-07 DIAGNOSIS — E538 Deficiency of other specified B group vitamins: Secondary | ICD-10-CM | POA: Diagnosis not present

## 2021-03-16 DIAGNOSIS — M810 Age-related osteoporosis without current pathological fracture: Secondary | ICD-10-CM | POA: Diagnosis not present

## 2021-04-01 DIAGNOSIS — E039 Hypothyroidism, unspecified: Secondary | ICD-10-CM | POA: Diagnosis not present

## 2021-04-01 DIAGNOSIS — I10 Essential (primary) hypertension: Secondary | ICD-10-CM | POA: Diagnosis not present

## 2021-04-01 DIAGNOSIS — E538 Deficiency of other specified B group vitamins: Secondary | ICD-10-CM | POA: Diagnosis not present

## 2021-04-01 DIAGNOSIS — E789 Disorder of lipoprotein metabolism, unspecified: Secondary | ICD-10-CM | POA: Diagnosis not present

## 2021-04-01 DIAGNOSIS — E559 Vitamin D deficiency, unspecified: Secondary | ICD-10-CM | POA: Diagnosis not present

## 2021-04-07 DIAGNOSIS — E039 Hypothyroidism, unspecified: Secondary | ICD-10-CM | POA: Diagnosis not present

## 2021-04-07 DIAGNOSIS — Z0000 Encounter for general adult medical examination without abnormal findings: Secondary | ICD-10-CM | POA: Diagnosis not present

## 2021-04-07 DIAGNOSIS — I129 Hypertensive chronic kidney disease with stage 1 through stage 4 chronic kidney disease, or unspecified chronic kidney disease: Secondary | ICD-10-CM | POA: Diagnosis not present

## 2021-04-07 DIAGNOSIS — E538 Deficiency of other specified B group vitamins: Secondary | ICD-10-CM | POA: Diagnosis not present

## 2021-04-07 DIAGNOSIS — E211 Secondary hyperparathyroidism, not elsewhere classified: Secondary | ICD-10-CM | POA: Diagnosis not present

## 2021-04-07 DIAGNOSIS — R443 Hallucinations, unspecified: Secondary | ICD-10-CM | POA: Diagnosis not present

## 2021-04-07 DIAGNOSIS — Z8719 Personal history of other diseases of the digestive system: Secondary | ICD-10-CM | POA: Diagnosis not present

## 2021-05-04 ENCOUNTER — Ambulatory Visit: Payer: PPO | Admitting: Podiatry

## 2021-05-04 ENCOUNTER — Other Ambulatory Visit: Payer: Self-pay

## 2021-05-04 ENCOUNTER — Encounter: Payer: Self-pay | Admitting: Podiatry

## 2021-05-04 DIAGNOSIS — M79674 Pain in right toe(s): Secondary | ICD-10-CM

## 2021-05-04 DIAGNOSIS — L84 Corns and callosities: Secondary | ICD-10-CM | POA: Diagnosis not present

## 2021-05-04 DIAGNOSIS — B351 Tinea unguium: Secondary | ICD-10-CM

## 2021-05-04 DIAGNOSIS — N179 Acute kidney failure, unspecified: Secondary | ICD-10-CM

## 2021-05-04 DIAGNOSIS — M79675 Pain in left toe(s): Secondary | ICD-10-CM

## 2021-05-04 NOTE — Progress Notes (Signed)
This patient returns to my office for at risk foot care.  This patient requires this care by a professional since this patient will be at risk due to having AKI.    This patient is unable to trim callus  herself since the patient cannot reach her feet.These callus  are painful walking and wearing shoes.  This patient presents for at risk foot care today.  General Appearance  Alert, conversant and in no acute stress.  Vascular  Dorsalis pedis and posterior tibial  pulses are palpable  bilaterally.  Capillary return is within normal limits  bilaterally. Temperature is within normal limits  bilaterally.  Neurologic  Senn-Weinstein monofilament wire test within normal limits  bilaterally. Muscle power within normal limits bilaterally.  Nails Thick disfigured discolored nails with subungual debris  from hallux to fifth toes bilaterally. No evidence of bacterial infection or drainage bilaterally.  Orthopedic  No limitations of motion  feet .  No crepitus or effusions noted.  No bony pathology or digital deformities noted.  Plantar flexed fifth met  B/L  Skin  normotropic skin with  noted bilaterally.  No signs of infections or ulcers noted.  Callus noted sub 1,5  Left foot and sub 5 right foot  Callus  B/L.  Consent was obtained for treatment procedures..Debridement of callus with # 15 blade and then  filed with dremel without incident.    Return office visit   3  months                  Told patient to return for periodic foot care and evaluation due to potential at risk complications.   Gardiner Barefoot DPM

## 2021-05-09 DIAGNOSIS — E538 Deficiency of other specified B group vitamins: Secondary | ICD-10-CM | POA: Diagnosis not present

## 2021-05-09 DIAGNOSIS — Z1231 Encounter for screening mammogram for malignant neoplasm of breast: Secondary | ICD-10-CM | POA: Diagnosis not present

## 2021-05-26 DIAGNOSIS — D0439 Carcinoma in situ of skin of other parts of face: Secondary | ICD-10-CM | POA: Diagnosis not present

## 2021-05-26 DIAGNOSIS — L821 Other seborrheic keratosis: Secondary | ICD-10-CM | POA: Diagnosis not present

## 2021-06-09 DIAGNOSIS — E538 Deficiency of other specified B group vitamins: Secondary | ICD-10-CM | POA: Diagnosis not present

## 2021-07-12 DIAGNOSIS — E538 Deficiency of other specified B group vitamins: Secondary | ICD-10-CM | POA: Diagnosis not present

## 2021-07-22 DIAGNOSIS — E039 Hypothyroidism, unspecified: Secondary | ICD-10-CM | POA: Diagnosis not present

## 2021-07-22 DIAGNOSIS — M810 Age-related osteoporosis without current pathological fracture: Secondary | ICD-10-CM | POA: Diagnosis not present

## 2021-07-22 DIAGNOSIS — I10 Essential (primary) hypertension: Secondary | ICD-10-CM | POA: Diagnosis not present

## 2021-07-22 DIAGNOSIS — E7800 Pure hypercholesterolemia, unspecified: Secondary | ICD-10-CM | POA: Diagnosis not present

## 2021-07-29 DIAGNOSIS — Z23 Encounter for immunization: Secondary | ICD-10-CM | POA: Diagnosis not present

## 2021-08-10 ENCOUNTER — Ambulatory Visit: Payer: PPO | Admitting: Podiatry

## 2021-08-10 ENCOUNTER — Encounter: Payer: Self-pay | Admitting: Podiatry

## 2021-08-10 ENCOUNTER — Other Ambulatory Visit: Payer: Self-pay

## 2021-08-10 DIAGNOSIS — M79674 Pain in right toe(s): Secondary | ICD-10-CM | POA: Diagnosis not present

## 2021-08-10 DIAGNOSIS — M79675 Pain in left toe(s): Secondary | ICD-10-CM | POA: Diagnosis not present

## 2021-08-10 DIAGNOSIS — L84 Corns and callosities: Secondary | ICD-10-CM

## 2021-08-10 DIAGNOSIS — N179 Acute kidney failure, unspecified: Secondary | ICD-10-CM

## 2021-08-10 DIAGNOSIS — B351 Tinea unguium: Secondary | ICD-10-CM

## 2021-08-10 NOTE — Progress Notes (Signed)
This patient returns to my office for at risk foot care.  This patient requires this care by a professional since this patient will be at risk due to having AKI.    This patient is unable to trim callus  herself since the patient cannot reach her feet.These callus  are painful walking and wearing shoes.  This patient presents for at risk foot care today.  General Appearance  Alert, conversant and in no acute stress.  Vascular  Dorsalis pedis and posterior tibial  pulses are palpable  bilaterally.  Capillary return is within normal limits  bilaterally. Temperature is within normal limits  bilaterally.  Neurologic  Senn-Weinstein monofilament wire test within normal limits  bilaterally. Muscle power within normal limits bilaterally.  Nails Thick disfigured discolored nails with subungual debris  from hallux to fifth toes bilaterally. No evidence of bacterial infection or drainage bilaterally.  Orthopedic  No limitations of motion  feet .  No crepitus or effusions noted.  No bony pathology or digital deformities noted.  Plantar flexed fifth met  B/L  Skin  normotropic skin with  noted bilaterally.  No signs of infections or ulcers noted.  Callus noted sub 1,5  Left foot .  Callus  B/L.  Onychomycosis    Consent was obtained for treatment procedures..Debridement of callus with # 15 blade and then  filed with dremel without incident.  Debride nails with nail nipper and dremel tool.   Return office visit   3  months                  Told patient to return for periodic foot care and evaluation due to potential at risk complications.   Gardiner Barefoot DPM

## 2021-08-15 DIAGNOSIS — E538 Deficiency of other specified B group vitamins: Secondary | ICD-10-CM | POA: Diagnosis not present

## 2021-08-22 DIAGNOSIS — E039 Hypothyroidism, unspecified: Secondary | ICD-10-CM | POA: Diagnosis not present

## 2021-08-22 DIAGNOSIS — M810 Age-related osteoporosis without current pathological fracture: Secondary | ICD-10-CM | POA: Diagnosis not present

## 2021-08-22 DIAGNOSIS — E7800 Pure hypercholesterolemia, unspecified: Secondary | ICD-10-CM | POA: Diagnosis not present

## 2021-08-22 DIAGNOSIS — I10 Essential (primary) hypertension: Secondary | ICD-10-CM | POA: Diagnosis not present

## 2021-09-19 DIAGNOSIS — E538 Deficiency of other specified B group vitamins: Secondary | ICD-10-CM | POA: Diagnosis not present

## 2021-09-19 DIAGNOSIS — M810 Age-related osteoporosis without current pathological fracture: Secondary | ICD-10-CM | POA: Diagnosis not present

## 2021-09-21 DIAGNOSIS — I10 Essential (primary) hypertension: Secondary | ICD-10-CM | POA: Diagnosis not present

## 2021-09-21 DIAGNOSIS — M810 Age-related osteoporosis without current pathological fracture: Secondary | ICD-10-CM | POA: Diagnosis not present

## 2021-09-21 DIAGNOSIS — E039 Hypothyroidism, unspecified: Secondary | ICD-10-CM | POA: Diagnosis not present

## 2021-09-21 DIAGNOSIS — E7800 Pure hypercholesterolemia, unspecified: Secondary | ICD-10-CM | POA: Diagnosis not present

## 2021-09-22 DIAGNOSIS — R262 Difficulty in walking, not elsewhere classified: Secondary | ICD-10-CM | POA: Diagnosis not present

## 2021-09-22 DIAGNOSIS — H8113 Benign paroxysmal vertigo, bilateral: Secondary | ICD-10-CM | POA: Diagnosis not present

## 2021-09-22 DIAGNOSIS — M4003 Postural kyphosis, cervicothoracic region: Secondary | ICD-10-CM | POA: Diagnosis not present

## 2021-09-22 DIAGNOSIS — R293 Abnormal posture: Secondary | ICD-10-CM | POA: Diagnosis not present

## 2021-10-04 DIAGNOSIS — E039 Hypothyroidism, unspecified: Secondary | ICD-10-CM | POA: Diagnosis not present

## 2021-10-04 DIAGNOSIS — R197 Diarrhea, unspecified: Secondary | ICD-10-CM | POA: Diagnosis not present

## 2021-10-04 DIAGNOSIS — I129 Hypertensive chronic kidney disease with stage 1 through stage 4 chronic kidney disease, or unspecified chronic kidney disease: Secondary | ICD-10-CM | POA: Diagnosis not present

## 2021-10-04 DIAGNOSIS — R059 Cough, unspecified: Secondary | ICD-10-CM | POA: Diagnosis not present

## 2021-10-04 DIAGNOSIS — E538 Deficiency of other specified B group vitamins: Secondary | ICD-10-CM | POA: Diagnosis not present

## 2021-10-04 DIAGNOSIS — E211 Secondary hyperparathyroidism, not elsewhere classified: Secondary | ICD-10-CM | POA: Diagnosis not present

## 2021-10-20 DIAGNOSIS — E211 Secondary hyperparathyroidism, not elsewhere classified: Secondary | ICD-10-CM | POA: Diagnosis not present

## 2021-10-20 DIAGNOSIS — N184 Chronic kidney disease, stage 4 (severe): Secondary | ICD-10-CM | POA: Diagnosis not present

## 2021-10-20 DIAGNOSIS — E538 Deficiency of other specified B group vitamins: Secondary | ICD-10-CM | POA: Diagnosis not present

## 2021-10-20 DIAGNOSIS — M810 Age-related osteoporosis without current pathological fracture: Secondary | ICD-10-CM | POA: Diagnosis not present

## 2021-10-20 DIAGNOSIS — I129 Hypertensive chronic kidney disease with stage 1 through stage 4 chronic kidney disease, or unspecified chronic kidney disease: Secondary | ICD-10-CM | POA: Diagnosis not present

## 2021-10-20 DIAGNOSIS — R197 Diarrhea, unspecified: Secondary | ICD-10-CM | POA: Diagnosis not present

## 2021-10-20 DIAGNOSIS — E039 Hypothyroidism, unspecified: Secondary | ICD-10-CM | POA: Diagnosis not present

## 2021-10-20 DIAGNOSIS — E7800 Pure hypercholesterolemia, unspecified: Secondary | ICD-10-CM | POA: Diagnosis not present

## 2021-10-21 DIAGNOSIS — E039 Hypothyroidism, unspecified: Secondary | ICD-10-CM | POA: Diagnosis not present

## 2021-10-21 DIAGNOSIS — M810 Age-related osteoporosis without current pathological fracture: Secondary | ICD-10-CM | POA: Diagnosis not present

## 2021-10-21 DIAGNOSIS — I10 Essential (primary) hypertension: Secondary | ICD-10-CM | POA: Diagnosis not present

## 2021-10-21 DIAGNOSIS — E7800 Pure hypercholesterolemia, unspecified: Secondary | ICD-10-CM | POA: Diagnosis not present

## 2021-11-15 ENCOUNTER — Ambulatory Visit: Payer: PPO | Admitting: Podiatry

## 2021-11-23 ENCOUNTER — Ambulatory Visit: Payer: PPO | Admitting: Podiatry

## 2021-11-23 ENCOUNTER — Encounter: Payer: Self-pay | Admitting: Podiatry

## 2021-11-23 ENCOUNTER — Other Ambulatory Visit: Payer: Self-pay

## 2021-11-23 DIAGNOSIS — B351 Tinea unguium: Secondary | ICD-10-CM

## 2021-11-23 DIAGNOSIS — M79675 Pain in left toe(s): Secondary | ICD-10-CM

## 2021-11-23 DIAGNOSIS — L84 Corns and callosities: Secondary | ICD-10-CM

## 2021-11-23 DIAGNOSIS — M79674 Pain in right toe(s): Secondary | ICD-10-CM

## 2021-11-23 DIAGNOSIS — D649 Anemia, unspecified: Secondary | ICD-10-CM

## 2021-11-23 DIAGNOSIS — N179 Acute kidney failure, unspecified: Secondary | ICD-10-CM

## 2021-11-23 NOTE — Progress Notes (Signed)
This patient returns to my office for at risk foot care.  This patient requires this care by a professional since this patient will be at risk due to having AKI.    This patient is unable to trim callus  herself since the patient cannot reach her feet.These callus  are painful walking and wearing shoes.  This patient presents for at risk foot care today.  General Appearance  Alert, conversant and in no acute stress.  Vascular  Dorsalis pedis and posterior tibial  pulses are palpable  bilaterally.  Capillary return is within normal limits  bilaterally. Temperature is within normal limits  bilaterally.  Neurologic  Senn-Weinstein monofilament wire test within normal limits  bilaterally. Muscle power within normal limits bilaterally.  Nails Thick disfigured discolored nails with subungual debris  from hallux to fifth toes bilaterally. No evidence of bacterial infection or drainage bilaterally.  Orthopedic  No limitations of motion  feet .  No crepitus or effusions noted.  No bony pathology or digital deformities noted.  Plantar flexed fifth met  B/L  Skin  normotropic skin with  noted bilaterally.  No signs of infections or ulcers noted.  Callus noted sub 1,5  Left foot .  Callus  B/L.  Onychomycosis    Consent was obtained for treatment procedures..Debridement of callus with # 15 blade and then  filed with dremel without incident.  Debride nails with nail nipper and dremel tool.   Return office visit   3  months                  Told patient to return for periodic foot care and evaluation due to potential at risk complications.   Meilyn Heindl DPM  

## 2021-12-06 ENCOUNTER — Emergency Department (HOSPITAL_COMMUNITY): Payer: PPO

## 2021-12-06 ENCOUNTER — Emergency Department (HOSPITAL_COMMUNITY)
Admission: EM | Admit: 2021-12-06 | Discharge: 2021-12-06 | Disposition: A | Discharge status: Home / Self Care | Payer: PPO | Attending: Emergency Medicine | Admitting: Emergency Medicine

## 2021-12-06 DIAGNOSIS — R110 Nausea: Secondary | ICD-10-CM | POA: Diagnosis not present

## 2021-12-06 DIAGNOSIS — Z79899 Other long term (current) drug therapy: Secondary | ICD-10-CM | POA: Insufficient documentation

## 2021-12-06 DIAGNOSIS — I700 Atherosclerosis of aorta: Secondary | ICD-10-CM | POA: Diagnosis not present

## 2021-12-06 DIAGNOSIS — I10 Essential (primary) hypertension: Secondary | ICD-10-CM | POA: Diagnosis not present

## 2021-12-06 DIAGNOSIS — R399 Unspecified symptoms and signs involving the genitourinary system: Secondary | ICD-10-CM | POA: Diagnosis not present

## 2021-12-06 DIAGNOSIS — N390 Urinary tract infection, site not specified: Secondary | ICD-10-CM

## 2021-12-06 DIAGNOSIS — R109 Unspecified abdominal pain: Secondary | ICD-10-CM | POA: Diagnosis not present

## 2021-12-06 DIAGNOSIS — R1084 Generalized abdominal pain: Secondary | ICD-10-CM | POA: Diagnosis not present

## 2021-12-06 DIAGNOSIS — R197 Diarrhea, unspecified: Secondary | ICD-10-CM | POA: Diagnosis not present

## 2021-12-06 LAB — COMPREHENSIVE METABOLIC PANEL
ALT: 11 U/L (ref 0–44)
AST: 22 U/L (ref 15–41)
Albumin: 3.6 g/dL (ref 3.5–5.0)
Alkaline Phosphatase: 74 U/L (ref 38–126)
Anion gap: 11 (ref 5–15)
BUN: 42 mg/dL — ABNORMAL HIGH (ref 8–23)
CO2: 18 mmol/L — ABNORMAL LOW (ref 22–32)
Calcium: 9.4 mg/dL (ref 8.9–10.3)
Chloride: 110 mmol/L (ref 98–111)
Creatinine, Ser: 1.66 mg/dL — ABNORMAL HIGH (ref 0.44–1.00)
GFR, Estimated: 30 mL/min — ABNORMAL LOW (ref >60)
Glucose, Bld: 108 mg/dL — ABNORMAL HIGH (ref 70–99)
Potassium: 4 mmol/L (ref 3.5–5.1)
Sodium: 139 mmol/L (ref 135–145)
Total Bilirubin: 0.3 mg/dL (ref 0.3–1.2)
Total Protein: 7.1 g/dL (ref 6.5–8.1)

## 2021-12-06 LAB — URINALYSIS, ROUTINE W REFLEX MICROSCOPIC
Bilirubin Urine: NEGATIVE
Glucose, UA: NEGATIVE mg/dL
Hgb urine dipstick: NEGATIVE
Ketones, ur: NEGATIVE mg/dL
Nitrite: NEGATIVE
Protein, ur: NEGATIVE mg/dL
Specific Gravity, Urine: 1.017 (ref 1.005–1.030)
pH: 5 (ref 5.0–8.0)

## 2021-12-06 LAB — CBC WITH DIFFERENTIAL/PLATELET
Abs Immature Granulocytes: 0.03 10*3/uL (ref 0.00–0.07)
Basophils Absolute: 0.1 10*3/uL (ref 0.0–0.1)
Basophils Relative: 1 %
Eosinophils Absolute: 0.1 10*3/uL (ref 0.0–0.5)
Eosinophils Relative: 2 %
HCT: 34.9 % — ABNORMAL LOW (ref 36.0–46.0)
Hemoglobin: 10.9 g/dL — ABNORMAL LOW (ref 12.0–15.0)
Immature Granulocytes: 1 %
Lymphocytes Relative: 20 %
Lymphs Abs: 1.3 10*3/uL (ref 0.7–4.0)
MCH: 30.3 pg (ref 26.0–34.0)
MCHC: 31.2 g/dL (ref 30.0–36.0)
MCV: 96.9 fL (ref 80.0–100.0)
Monocytes Absolute: 0.6 10*3/uL (ref 0.1–1.0)
Monocytes Relative: 9 %
Neutro Abs: 4.5 10*3/uL (ref 1.7–7.7)
Neutrophils Relative %: 67 %
Platelets: 235 10*3/uL (ref 150–400)
RBC: 3.6 MIL/uL — ABNORMAL LOW (ref 3.87–5.11)
RDW: 13.4 % (ref 11.5–15.5)
WBC: 6.6 10*3/uL (ref 4.0–10.5)
nRBC: 0 % (ref 0.0–0.2)

## 2021-12-06 LAB — LIPASE, BLOOD: Lipase: 40 U/L (ref 11–51)

## 2021-12-06 MED ORDER — ALUM & MAG HYDROXIDE-SIMETH 200-200-20 MG/5ML PO SUSP
30.0000 mL | Freq: Once | ORAL | Status: AC
  Administered 2021-12-06: 30 mL via ORAL
  Filled 2021-12-06: qty 30

## 2021-12-06 MED ORDER — SUCRALFATE 1 G PO TABS
1.0000 g | ORAL_TABLET | Freq: Once | ORAL | Status: AC
  Administered 2021-12-06: 1 g via ORAL
  Filled 2021-12-06: qty 1

## 2021-12-06 MED ORDER — CEFDINIR 300 MG PO CAPS: 300.0000 mg | ORAL_CAPSULE | Freq: Two times a day (BID) | ORAL | 0 refills | Status: DC

## 2021-12-06 MED ORDER — LIDOCAINE VISCOUS HCL 2 % MT SOLN
15.0000 mL | Freq: Once | OROMUCOSAL | Status: AC
  Administered 2021-12-06: 15 mL via ORAL
  Filled 2021-12-06: qty 15

## 2021-12-06 NOTE — ED Provider Notes (Signed)
[facility] EMERGENCY DEPARTMENT Provider Note   CSN: [id number] Arrival date & time: 12/06/21  1415     History  Chief Complaint  Patient presents with   Abdominal Pain   Nausea    Jennifer Patel is a 88 y.o. female.  88 year old female presents with several weeks of abdominal discomfort.  Pain characterizes crampy and diffuse.  It is associated with food.  She has had nausea but no vomiting.  No fever or chills.  No urinary symptoms.  Has not been taking any medications for this.  No prior history of same      Home Medications Prior to Admission medications   Medication Sig Start Date End Date Taking? Authorizing Provider  acetaminophen (TYLENOL) 500 MG tablet Take 1 tablet (500 mg total) by mouth every 6 (six) hours as needed (pain). 03/31/20   Granville Lewis C, PA-C  allopurinol (ZYLOPRIM) 100 MG tablet Take 2 tablets (200 mg total) by mouth daily. 03/31/20   Granville Lewis C, PA-C  atorvastatin (LIPITOR) 10 MG tablet Take 1 tablet (10 mg total) by mouth daily. Reported on 11/11/2015 03/31/20   Wille Celeste, PA-C  Cyanocobalamin (VITAMIN B-12 IJ) Inject as directed every 30 (thirty) days. Next injection due 05/24/15 at Dr. Eugenio Hoes office    [provider]  enoxaparin (LOVENOX) 30 MG/0.3ML injection Inject 0.3 mLs (30 mg total) into the skin daily. 03/15/20 04/14/20  Corinne Ports, PA-C  furosemide (LASIX) 20 MG tablet Take 1 tablet (20 mg total) by mouth every Monday, Wednesday, and Friday. 03/31/20   Granville Lewis C, PA-C  hydrALAZINE (APRESOLINE) 25 MG tablet Take 1 tablet (25 mg total) by mouth 3 (three) times daily. 03/31/20   Wille Celeste, PA-C  HYDROcodone-acetaminophen (NORCO/VICODIN) 5-325 MG tablet Take 1 tablet by mouth every 6 (six) hours as needed for severe pain. 03/14/20   Corinne Ports, PA-C  lansoprazole (PREVACID) 15 MG capsule Take 1 capsule (15 mg total) by mouth daily at 12 noon. 03/31/20   Wille Celeste, PA-C  levothyroxine (SYNTHROID) 75  MCG tablet Take 1 tablet (75 mcg total) by mouth daily. 03/31/20   Granville Lewis C, PA-C  Nebivolol HCl (BYSTOLIC) 20 MG TABS Take 1 tablet (20 mg total) by mouth daily. 03/31/20   Granville Lewis C, PA-C  Polyethyl Glycol-Propyl Glycol (SYSTANE OP) Place 1 drop into both eyes 2 (two) times daily as needed (dry eyes).    [provider]  spironolactone (ALDACTONE) 25 MG tablet Take 0.5 tablets (12.5 mg total) by mouth daily. 03/31/20   Granville Lewis C, PA-C      Allergies    Clarithromycin and Prednisone    Review of Systems   Review of Systems  All other systems reviewed and are negative.  Physical Exam Updated Vital Signs BP (!) 150/64    Pulse 80    Temp 98.4 F (36.9 C) (Oral)    Resp 16    SpO2 96%  Physical Exam Vitals and nursing note reviewed.  Constitutional:      General: She is not in acute distress.    Appearance: Normal appearance. She is well-developed. She is not toxic-appearing.  HENT:     Head: Normocephalic and atraumatic.  Eyes:     General: Lids are normal.     Conjunctiva/sclera: Conjunctivae normal.     Pupils: Pupils are equal, round, and reactive to light.  Neck:     Thyroid: No thyroid mass.     Trachea:  No tracheal deviation.  Cardiovascular:     Rate and Rhythm: Normal rate and regular rhythm.     Heart sounds: Normal heart sounds. No murmur heard.   No gallop.  Pulmonary:     Effort: Pulmonary effort is normal. No respiratory distress.     Breath sounds: Normal breath sounds. No stridor. No decreased breath sounds, wheezing, rhonchi or rales.  Abdominal:     General: There is no distension.     Palpations: Abdomen is soft.     Tenderness: There is no abdominal tenderness. There is no guarding or rebound.    Musculoskeletal:        General: No tenderness. Normal range of motion.     Cervical back: Normal range of motion and neck supple.  Skin:    General: Skin is warm and dry.     Findings: No abrasion or rash.  Neurological:     Mental  Status: She is alert and oriented to person, place, and time. Mental status is at baseline.     GCS: GCS eye subscore is 4. GCS verbal subscore is 5. GCS motor subscore is 6.     Cranial Nerves: No cranial nerve deficit.     Sensory: No sensory deficit.     Motor: Motor function is intact.  Psychiatric:        Attention and Perception: Attention normal.        Speech: Speech normal.        Behavior: Behavior normal.    ED Results / Procedures / Treatments   Labs (all labs ordered are listed, but only abnormal results are displayed) Labs Reviewed  CBC WITH DIFFERENTIAL/PLATELET - Abnormal; Notable for the following components:      Result Value   RBC 3.60 (*)    Hemoglobin 10.9 (*)    HCT 34.9 (*)    All other components within normal limits  COMPREHENSIVE METABOLIC PANEL - Abnormal; Notable for the following components:   CO2 18 (*)    Glucose, Bld 108 (*)    BUN 42 (*)    Creatinine, Ser 1.66 (*)    GFR, Estimated 30 (*)    All other components within normal limits  LIPASE, BLOOD  URINALYSIS, ROUTINE W REFLEX MICROSCOPIC    EKG None  Radiology No results found.  Procedures Procedures    Medications Ordered in ED Medications  alum & mag hydroxide-simeth (MAALOX/MYLANTA) 200-200-20 MG/5ML suspension 30 mL (30 mLs Oral Given 12/06/21 1714)    And  lidocaine (XYLOCAINE) 2 % viscous mouth solution 15 mL (15 mLs Oral Given 12/06/21 1714)  sucralfate (CARAFATE) tablet 1 g (1 g Oral Given 12/06/21 1714)    ED Course/ Medical Decision Making/ A&P                           Medical Decision Making Amount and/or Complexity of Data Reviewed Radiology: ordered.  Risk OTC drugs. Prescription drug management.   Patient presented with abdominal pain lower in nature.  Concern for possible bowel obstruction versus other acute abdominal processes.  CT did not show any new active disease.  Patient is urinalysis positive for infection.  She has no leukocytosis on her CBC  therefore doubt patient has serious bacterial infection.  She is renal function noted and this is around her baseline.  Patient is alert and oriented at this time.  Will be treated with cefdinir and discharged home  Final Clinical Impression(s) / ED Diagnoses Final diagnoses:  None    Rx / DC Orders ED Discharge Orders     None         Lacretia Leigh, MD 12/06/21 2005

## 2021-12-06 NOTE — ED Triage Notes (Signed)
EMS stated, Had abdominal pain for a week, denies any other symptoms.

## 2021-12-06 NOTE — ED Provider Triage Note (Signed)
Emergency Medicine Provider Triage Evaluation Note  Jennifer Patel , a 88 y.o. female  was evaluated in triage.  Pt complains of abdominal pain and nausea x1 week.  Worsened today.  She usually feels it after meals, can happen at times.  No vomiting, she states she is having regular bowel movements   Status post cholecystectomy and abdominal hysterectomy.  Review of Systems  Positive: Abdominal pain, nausea Negative: VOMITING  Physical Exam  There were no vitals taken for this visit. Gen:   Awake, no distress   Resp:  Normal effort  MSK:   Moves extremities without difficulty  Other:  Diffusely tender abdomen, it is soft.  Slightly distended.  Medical Decision Making  Medically screening exam initiated at 2:28 PM.  Appropriate orders placed.  LEEAN AMEZCUA was informed that the remainder of the evaluation will be completed by another provider, this initial triage assessment does not replace that evaluation, and the importance of remaining in the ED until their evaluation is complete.     Sherrill Raring, PA-C 12/06/21 1430

## 2021-12-20 DIAGNOSIS — E7800 Pure hypercholesterolemia, unspecified: Secondary | ICD-10-CM | POA: Diagnosis not present

## 2021-12-20 DIAGNOSIS — E039 Hypothyroidism, unspecified: Secondary | ICD-10-CM | POA: Diagnosis not present

## 2021-12-20 DIAGNOSIS — I10 Essential (primary) hypertension: Secondary | ICD-10-CM | POA: Diagnosis not present

## 2021-12-20 DIAGNOSIS — M810 Age-related osteoporosis without current pathological fracture: Secondary | ICD-10-CM | POA: Diagnosis not present

## 2022-01-10 DIAGNOSIS — M1711 Unilateral primary osteoarthritis, right knee: Secondary | ICD-10-CM | POA: Diagnosis not present

## 2022-01-19 DIAGNOSIS — D692 Other nonthrombocytopenic purpura: Secondary | ICD-10-CM | POA: Diagnosis not present

## 2022-01-20 DIAGNOSIS — E039 Hypothyroidism, unspecified: Secondary | ICD-10-CM | POA: Diagnosis not present

## 2022-01-20 DIAGNOSIS — I10 Essential (primary) hypertension: Secondary | ICD-10-CM | POA: Diagnosis not present

## 2022-01-20 DIAGNOSIS — E7800 Pure hypercholesterolemia, unspecified: Secondary | ICD-10-CM | POA: Diagnosis not present

## 2022-01-20 DIAGNOSIS — M810 Age-related osteoporosis without current pathological fracture: Secondary | ICD-10-CM | POA: Diagnosis not present

## 2022-02-08 DIAGNOSIS — E538 Deficiency of other specified B group vitamins: Secondary | ICD-10-CM | POA: Diagnosis not present

## 2022-02-14 DIAGNOSIS — F29 Unspecified psychosis not due to a substance or known physiological condition: Secondary | ICD-10-CM | POA: Diagnosis not present

## 2022-02-19 DIAGNOSIS — E7800 Pure hypercholesterolemia, unspecified: Secondary | ICD-10-CM | POA: Diagnosis not present

## 2022-02-19 DIAGNOSIS — E039 Hypothyroidism, unspecified: Secondary | ICD-10-CM | POA: Diagnosis not present

## 2022-02-19 DIAGNOSIS — I10 Essential (primary) hypertension: Secondary | ICD-10-CM | POA: Diagnosis not present

## 2022-02-19 DIAGNOSIS — M810 Age-related osteoporosis without current pathological fracture: Secondary | ICD-10-CM | POA: Diagnosis not present

## 2022-02-21 ENCOUNTER — Ambulatory Visit: Payer: PPO | Admitting: Podiatry

## 2022-02-21 ENCOUNTER — Encounter: Payer: Self-pay | Admitting: Podiatry

## 2022-02-21 DIAGNOSIS — L84 Corns and callosities: Secondary | ICD-10-CM

## 2022-02-21 DIAGNOSIS — M79674 Pain in right toe(s): Secondary | ICD-10-CM

## 2022-02-21 DIAGNOSIS — B351 Tinea unguium: Secondary | ICD-10-CM

## 2022-02-21 DIAGNOSIS — M79675 Pain in left toe(s): Secondary | ICD-10-CM | POA: Diagnosis not present

## 2022-02-21 DIAGNOSIS — N179 Acute kidney failure, unspecified: Secondary | ICD-10-CM

## 2022-02-21 NOTE — Progress Notes (Signed)
This patient returns to my office for at risk foot care.  This patient requires this care by a professional since this patient will be at risk due to having AKI.    This patient is unable to trim callus  herself since the patient cannot reach her feet.These callus  are painful walking and wearing shoes.  This patient presents for at risk foot care today.  General Appearance  Alert, conversant and in no acute stress.  Vascular  Dorsalis pedis and posterior tibial  pulses are palpable  bilaterally.  Capillary return is within normal limits  bilaterally. Temperature is within normal limits  bilaterally.  Neurologic  Senn-Weinstein monofilament wire test within normal limits  bilaterally. Muscle power within normal limits bilaterally.  Nails Thick disfigured discolored nails with subungual debris  from hallux to fifth toes bilaterally. No evidence of bacterial infection or drainage bilaterally.  Orthopedic  No limitations of motion  feet .  No crepitus or effusions noted.  No bony pathology or digital deformities noted.  Plantar flexed fifth met  B/L  Skin  normotropic skin with  noted bilaterally.  No signs of infections or ulcers noted.  Callus noted sub 1,5  Left foot .  Callus  B/L.  Onychomycosis    Consent was obtained for treatment procedures..Debridement of callus with # 15 blade and then  filed with dremel without incident.  Debride nails with nail nipper and dremel tool.   Return office visit   3  months                  Told patient to return for periodic foot care and evaluation due to potential at risk complications.   Arneshia Ade DPM  

## 2022-02-27 DIAGNOSIS — Z961 Presence of intraocular lens: Secondary | ICD-10-CM | POA: Diagnosis not present

## 2022-02-27 DIAGNOSIS — H52203 Unspecified astigmatism, bilateral: Secondary | ICD-10-CM | POA: Diagnosis not present

## 2022-02-27 DIAGNOSIS — H5213 Myopia, bilateral: Secondary | ICD-10-CM | POA: Diagnosis not present

## 2022-02-27 DIAGNOSIS — H524 Presbyopia: Secondary | ICD-10-CM | POA: Diagnosis not present

## 2022-02-27 DIAGNOSIS — H18513 Endothelial corneal dystrophy, bilateral: Secondary | ICD-10-CM | POA: Diagnosis not present

## 2022-03-09 DIAGNOSIS — E538 Deficiency of other specified B group vitamins: Secondary | ICD-10-CM | POA: Diagnosis not present

## 2022-03-14 DIAGNOSIS — F29 Unspecified psychosis not due to a substance or known physiological condition: Secondary | ICD-10-CM | POA: Diagnosis not present

## 2022-03-15 DIAGNOSIS — E039 Hypothyroidism, unspecified: Secondary | ICD-10-CM | POA: Diagnosis not present

## 2022-03-15 DIAGNOSIS — E211 Secondary hyperparathyroidism, not elsewhere classified: Secondary | ICD-10-CM | POA: Diagnosis not present

## 2022-03-21 DIAGNOSIS — D649 Anemia, unspecified: Secondary | ICD-10-CM | POA: Diagnosis not present

## 2022-03-21 DIAGNOSIS — E58 Dietary calcium deficiency: Secondary | ICD-10-CM | POA: Diagnosis not present

## 2022-03-21 DIAGNOSIS — B351 Tinea unguium: Secondary | ICD-10-CM | POA: Diagnosis not present

## 2022-03-22 DIAGNOSIS — I10 Essential (primary) hypertension: Secondary | ICD-10-CM | POA: Diagnosis not present

## 2022-03-22 DIAGNOSIS — M810 Age-related osteoporosis without current pathological fracture: Secondary | ICD-10-CM | POA: Diagnosis not present

## 2022-03-22 DIAGNOSIS — E7800 Pure hypercholesterolemia, unspecified: Secondary | ICD-10-CM | POA: Diagnosis not present

## 2022-03-22 DIAGNOSIS — E039 Hypothyroidism, unspecified: Secondary | ICD-10-CM | POA: Diagnosis not present

## 2022-04-03 DIAGNOSIS — M810 Age-related osteoporosis without current pathological fracture: Secondary | ICD-10-CM | POA: Diagnosis not present

## 2022-04-03 DIAGNOSIS — E538 Deficiency of other specified B group vitamins: Secondary | ICD-10-CM | POA: Diagnosis not present

## 2022-04-03 DIAGNOSIS — E7800 Pure hypercholesterolemia, unspecified: Secondary | ICD-10-CM | POA: Diagnosis not present

## 2022-04-03 DIAGNOSIS — Z8719 Personal history of other diseases of the digestive system: Secondary | ICD-10-CM | POA: Diagnosis not present

## 2022-04-03 DIAGNOSIS — E039 Hypothyroidism, unspecified: Secondary | ICD-10-CM | POA: Diagnosis not present

## 2022-04-03 DIAGNOSIS — I129 Hypertensive chronic kidney disease with stage 1 through stage 4 chronic kidney disease, or unspecified chronic kidney disease: Secondary | ICD-10-CM | POA: Diagnosis not present

## 2022-04-03 DIAGNOSIS — R7989 Other specified abnormal findings of blood chemistry: Secondary | ICD-10-CM | POA: Diagnosis not present

## 2022-04-03 DIAGNOSIS — E559 Vitamin D deficiency, unspecified: Secondary | ICD-10-CM | POA: Diagnosis not present

## 2022-04-11 DIAGNOSIS — F29 Unspecified psychosis not due to a substance or known physiological condition: Secondary | ICD-10-CM | POA: Diagnosis not present

## 2022-04-18 DIAGNOSIS — E538 Deficiency of other specified B group vitamins: Secondary | ICD-10-CM | POA: Diagnosis not present

## 2022-05-03 DIAGNOSIS — R262 Difficulty in walking, not elsewhere classified: Secondary | ICD-10-CM | POA: Diagnosis not present

## 2022-05-03 DIAGNOSIS — H8113 Benign paroxysmal vertigo, bilateral: Secondary | ICD-10-CM | POA: Diagnosis not present

## 2022-05-03 DIAGNOSIS — R6889 Other general symptoms and signs: Secondary | ICD-10-CM | POA: Diagnosis not present

## 2022-05-03 DIAGNOSIS — E039 Hypothyroidism, unspecified: Secondary | ICD-10-CM | POA: Diagnosis not present

## 2022-05-03 DIAGNOSIS — R293 Abnormal posture: Secondary | ICD-10-CM | POA: Diagnosis not present

## 2022-05-03 DIAGNOSIS — M4003 Postural kyphosis, cervicothoracic region: Secondary | ICD-10-CM | POA: Diagnosis not present

## 2022-05-03 DIAGNOSIS — D649 Anemia, unspecified: Secondary | ICD-10-CM | POA: Diagnosis not present

## 2022-05-03 DIAGNOSIS — I129 Hypertensive chronic kidney disease with stage 1 through stage 4 chronic kidney disease, or unspecified chronic kidney disease: Secondary | ICD-10-CM | POA: Diagnosis not present

## 2022-05-03 DIAGNOSIS — Z0000 Encounter for general adult medical examination without abnormal findings: Secondary | ICD-10-CM | POA: Diagnosis not present

## 2022-05-03 DIAGNOSIS — R443 Hallucinations, unspecified: Secondary | ICD-10-CM | POA: Diagnosis not present

## 2022-05-03 DIAGNOSIS — E211 Secondary hyperparathyroidism, not elsewhere classified: Secondary | ICD-10-CM | POA: Diagnosis not present

## 2022-05-09 DIAGNOSIS — H8113 Benign paroxysmal vertigo, bilateral: Secondary | ICD-10-CM | POA: Diagnosis not present

## 2022-05-09 DIAGNOSIS — M4003 Postural kyphosis, cervicothoracic region: Secondary | ICD-10-CM | POA: Diagnosis not present

## 2022-05-09 DIAGNOSIS — R293 Abnormal posture: Secondary | ICD-10-CM | POA: Diagnosis not present

## 2022-05-09 DIAGNOSIS — R262 Difficulty in walking, not elsewhere classified: Secondary | ICD-10-CM | POA: Diagnosis not present

## 2022-05-17 DIAGNOSIS — J069 Acute upper respiratory infection, unspecified: Secondary | ICD-10-CM | POA: Diagnosis not present

## 2022-05-17 DIAGNOSIS — D692 Other nonthrombocytopenic purpura: Secondary | ICD-10-CM | POA: Diagnosis not present

## 2022-05-17 DIAGNOSIS — D649 Anemia, unspecified: Secondary | ICD-10-CM | POA: Diagnosis not present

## 2022-05-17 DIAGNOSIS — Z681 Body mass index (BMI) 19 or less, adult: Secondary | ICD-10-CM | POA: Diagnosis not present

## 2022-05-17 DIAGNOSIS — I8390 Asymptomatic varicose veins of unspecified lower extremity: Secondary | ICD-10-CM | POA: Diagnosis not present

## 2022-05-17 DIAGNOSIS — R441 Visual hallucinations: Secondary | ICD-10-CM | POA: Diagnosis not present

## 2022-05-23 DIAGNOSIS — F29 Unspecified psychosis not due to a substance or known physiological condition: Secondary | ICD-10-CM | POA: Diagnosis not present

## 2022-05-25 DIAGNOSIS — Z09 Encounter for follow-up examination after completed treatment for conditions other than malignant neoplasm: Secondary | ICD-10-CM | POA: Diagnosis not present

## 2022-05-25 DIAGNOSIS — Z8709 Personal history of other diseases of the respiratory system: Secondary | ICD-10-CM | POA: Diagnosis not present

## 2022-05-29 ENCOUNTER — Ambulatory Visit: Payer: PPO | Admitting: Podiatry

## 2022-05-31 DIAGNOSIS — Z1231 Encounter for screening mammogram for malignant neoplasm of breast: Secondary | ICD-10-CM | POA: Diagnosis not present

## 2022-06-05 DIAGNOSIS — Z09 Encounter for follow-up examination after completed treatment for conditions other than malignant neoplasm: Secondary | ICD-10-CM | POA: Diagnosis not present

## 2022-06-05 DIAGNOSIS — E538 Deficiency of other specified B group vitamins: Secondary | ICD-10-CM | POA: Diagnosis not present

## 2022-06-05 DIAGNOSIS — Z8709 Personal history of other diseases of the respiratory system: Secondary | ICD-10-CM | POA: Diagnosis not present

## 2022-07-03 DIAGNOSIS — D692 Other nonthrombocytopenic purpura: Secondary | ICD-10-CM | POA: Diagnosis not present

## 2022-07-03 DIAGNOSIS — Z6820 Body mass index (BMI) 20.0-20.9, adult: Secondary | ICD-10-CM | POA: Diagnosis not present

## 2022-07-03 DIAGNOSIS — I10 Essential (primary) hypertension: Secondary | ICD-10-CM | POA: Diagnosis not present

## 2022-07-07 ENCOUNTER — Ambulatory Visit: Payer: PPO | Admitting: Podiatry

## 2022-07-07 ENCOUNTER — Encounter: Payer: Self-pay | Admitting: Podiatry

## 2022-07-07 DIAGNOSIS — M79674 Pain in right toe(s): Secondary | ICD-10-CM | POA: Diagnosis not present

## 2022-07-07 DIAGNOSIS — M79675 Pain in left toe(s): Secondary | ICD-10-CM

## 2022-07-07 DIAGNOSIS — L84 Corns and callosities: Secondary | ICD-10-CM | POA: Diagnosis not present

## 2022-07-07 DIAGNOSIS — B351 Tinea unguium: Secondary | ICD-10-CM | POA: Diagnosis not present

## 2022-07-07 DIAGNOSIS — N179 Acute kidney failure, unspecified: Secondary | ICD-10-CM

## 2022-07-07 NOTE — Progress Notes (Signed)
This patient returns to my office for at risk foot care.  This patient requires this care by a professional since this patient will be at risk due to having AKI.    This patient is unable to trim callus  herself since the patient cannot reach her feet.These callus  are painful walking and wearing shoes.  This patient presents for at risk foot care today.  General Appearance  Alert, conversant and in no acute stress.  Vascular  Dorsalis pedis and posterior tibial  pulses are palpable  bilaterally.  Capillary return is within normal limits  bilaterally. Temperature is within normal limits  bilaterally.  Neurologic  Senn-Weinstein monofilament wire test within normal limits  bilaterally. Muscle power within normal limits bilaterally.  Nails Thick disfigured discolored nails with subungual debris  from hallux to fifth toes bilaterally. No evidence of bacterial infection or drainage bilaterally.  Orthopedic  No limitations of motion  feet .  No crepitus or effusions noted.  No bony pathology or digital deformities noted.  Plantar flexed fifth met  B/L  Skin  normotropic skin with  noted bilaterally.  No signs of infections or ulcers noted.  Callus noted sub 1,5  Left foot .  Callus  B/L.  Onychomycosis    Consent was obtained for treatment procedures..Debridement of callus with # 15 blade and then  filed with dremel without incident.  Debride nails with nail nipper and dremel tool.   Return office visit   3  months                  Told patient to return for periodic foot care and evaluation due to potential at risk complications.   Gardiner Barefoot DPM

## 2022-07-10 DIAGNOSIS — E538 Deficiency of other specified B group vitamins: Secondary | ICD-10-CM | POA: Diagnosis not present

## 2022-07-22 DIAGNOSIS — E7800 Pure hypercholesterolemia, unspecified: Secondary | ICD-10-CM | POA: Diagnosis not present

## 2022-07-22 DIAGNOSIS — M810 Age-related osteoporosis without current pathological fracture: Secondary | ICD-10-CM | POA: Diagnosis not present

## 2022-07-22 DIAGNOSIS — I129 Hypertensive chronic kidney disease with stage 1 through stage 4 chronic kidney disease, or unspecified chronic kidney disease: Secondary | ICD-10-CM | POA: Diagnosis not present

## 2022-07-22 DIAGNOSIS — E039 Hypothyroidism, unspecified: Secondary | ICD-10-CM | POA: Diagnosis not present

## 2022-08-09 DIAGNOSIS — E559 Vitamin D deficiency, unspecified: Secondary | ICD-10-CM | POA: Diagnosis not present

## 2022-08-09 DIAGNOSIS — M62838 Other muscle spasm: Secondary | ICD-10-CM | POA: Diagnosis not present

## 2022-08-09 DIAGNOSIS — E789 Disorder of lipoprotein metabolism, unspecified: Secondary | ICD-10-CM | POA: Diagnosis not present

## 2022-08-09 DIAGNOSIS — M810 Age-related osteoporosis without current pathological fracture: Secondary | ICD-10-CM | POA: Diagnosis not present

## 2022-08-09 DIAGNOSIS — R7989 Other specified abnormal findings of blood chemistry: Secondary | ICD-10-CM | POA: Diagnosis not present

## 2022-08-09 DIAGNOSIS — E538 Deficiency of other specified B group vitamins: Secondary | ICD-10-CM | POA: Diagnosis not present

## 2022-08-09 DIAGNOSIS — E039 Hypothyroidism, unspecified: Secondary | ICD-10-CM | POA: Diagnosis not present

## 2022-08-09 DIAGNOSIS — R7309 Other abnormal glucose: Secondary | ICD-10-CM | POA: Diagnosis not present

## 2022-08-09 DIAGNOSIS — E211 Secondary hyperparathyroidism, not elsewhere classified: Secondary | ICD-10-CM | POA: Diagnosis not present

## 2022-08-09 DIAGNOSIS — I129 Hypertensive chronic kidney disease with stage 1 through stage 4 chronic kidney disease, or unspecified chronic kidney disease: Secondary | ICD-10-CM | POA: Diagnosis not present

## 2022-09-01 DIAGNOSIS — Z8659 Personal history of other mental and behavioral disorders: Secondary | ICD-10-CM | POA: Diagnosis not present

## 2022-09-01 DIAGNOSIS — Z09 Encounter for follow-up examination after completed treatment for conditions other than malignant neoplasm: Secondary | ICD-10-CM | POA: Diagnosis not present

## 2022-09-11 DIAGNOSIS — E538 Deficiency of other specified B group vitamins: Secondary | ICD-10-CM | POA: Diagnosis not present

## 2022-09-18 DIAGNOSIS — R42 Dizziness and giddiness: Secondary | ICD-10-CM | POA: Diagnosis not present

## 2022-09-21 DIAGNOSIS — E7800 Pure hypercholesterolemia, unspecified: Secondary | ICD-10-CM | POA: Diagnosis not present

## 2022-09-21 DIAGNOSIS — I129 Hypertensive chronic kidney disease with stage 1 through stage 4 chronic kidney disease, or unspecified chronic kidney disease: Secondary | ICD-10-CM | POA: Diagnosis not present

## 2022-09-21 DIAGNOSIS — E039 Hypothyroidism, unspecified: Secondary | ICD-10-CM | POA: Diagnosis not present

## 2022-09-21 DIAGNOSIS — M810 Age-related osteoporosis without current pathological fracture: Secondary | ICD-10-CM | POA: Diagnosis not present

## 2022-10-09 DIAGNOSIS — E538 Deficiency of other specified B group vitamins: Secondary | ICD-10-CM | POA: Diagnosis not present

## 2022-10-11 ENCOUNTER — Ambulatory Visit: Payer: PPO | Admitting: Podiatry

## 2022-10-11 ENCOUNTER — Encounter: Payer: Self-pay | Admitting: Podiatry

## 2022-10-11 DIAGNOSIS — N179 Acute kidney failure, unspecified: Secondary | ICD-10-CM | POA: Diagnosis not present

## 2022-10-11 DIAGNOSIS — B351 Tinea unguium: Secondary | ICD-10-CM | POA: Diagnosis not present

## 2022-10-11 DIAGNOSIS — L84 Corns and callosities: Secondary | ICD-10-CM

## 2022-10-11 DIAGNOSIS — M79675 Pain in left toe(s): Secondary | ICD-10-CM

## 2022-10-11 DIAGNOSIS — M79674 Pain in right toe(s): Secondary | ICD-10-CM | POA: Diagnosis not present

## 2022-10-11 NOTE — Progress Notes (Signed)
This patient returns to my office for at risk foot care.  This patient requires this care by a professional since this patient will be at risk due to having AKI.    This patient is unable to trim callus  herself since the patient cannot reach her feet.These callus  are painful walking and wearing shoes.  This patient presents for at risk foot care today.  General Appearance  Alert, conversant and in no acute stress.  Vascular  Dorsalis pedis and posterior tibial  pulses are palpable  bilaterally.  Capillary return is within normal limits  bilaterally. Temperature is within normal limits  bilaterally.  Neurologic  Senn-Weinstein monofilament wire test within normal limits  bilaterally. Muscle power within normal limits bilaterally.  Nails Thick disfigured discolored nails with subungual debris  from hallux to fifth toes bilaterally. No evidence of bacterial infection or drainage bilaterally.  Orthopedic  No limitations of motion  feet .  No crepitus or effusions noted.  No bony pathology or digital deformities noted.  Plantar flexed fifth met  B/L  Skin  normotropic skin with  noted bilaterally.  No signs of infections or ulcers noted.  Callus noted sub 1,5  Left foot .  Callus  B/L.  Onychomycosis    Consent was obtained for treatment procedures..Debridement of callus with # 15 blade and then  filed with dremel without incident.  Debride nails with nail nipper and dremel tool.   Return office visit   3  months                  Told patient to return for periodic foot care and evaluation due to potential at risk complications.   Gardiner Barefoot DPM

## 2022-11-08 DIAGNOSIS — E538 Deficiency of other specified B group vitamins: Secondary | ICD-10-CM | POA: Diagnosis not present

## 2022-11-20 DIAGNOSIS — E790 Hyperuricemia without signs of inflammatory arthritis and tophaceous disease: Secondary | ICD-10-CM | POA: Diagnosis not present

## 2022-11-20 DIAGNOSIS — Z8719 Personal history of other diseases of the digestive system: Secondary | ICD-10-CM | POA: Diagnosis not present

## 2022-11-20 DIAGNOSIS — E7800 Pure hypercholesterolemia, unspecified: Secondary | ICD-10-CM | POA: Diagnosis not present

## 2022-11-20 DIAGNOSIS — E039 Hypothyroidism, unspecified: Secondary | ICD-10-CM | POA: Diagnosis not present

## 2022-11-20 DIAGNOSIS — E789 Disorder of lipoprotein metabolism, unspecified: Secondary | ICD-10-CM | POA: Diagnosis not present

## 2022-11-20 DIAGNOSIS — N184 Chronic kidney disease, stage 4 (severe): Secondary | ICD-10-CM | POA: Diagnosis not present

## 2022-11-20 DIAGNOSIS — E211 Secondary hyperparathyroidism, not elsewhere classified: Secondary | ICD-10-CM | POA: Diagnosis not present

## 2022-11-20 DIAGNOSIS — R443 Hallucinations, unspecified: Secondary | ICD-10-CM | POA: Diagnosis not present

## 2022-11-20 DIAGNOSIS — I129 Hypertensive chronic kidney disease with stage 1 through stage 4 chronic kidney disease, or unspecified chronic kidney disease: Secondary | ICD-10-CM | POA: Diagnosis not present

## 2022-11-20 DIAGNOSIS — E538 Deficiency of other specified B group vitamins: Secondary | ICD-10-CM | POA: Diagnosis not present

## 2022-11-20 DIAGNOSIS — E559 Vitamin D deficiency, unspecified: Secondary | ICD-10-CM | POA: Diagnosis not present

## 2022-11-20 DIAGNOSIS — M810 Age-related osteoporosis without current pathological fracture: Secondary | ICD-10-CM | POA: Diagnosis not present

## 2022-12-05 DIAGNOSIS — F29 Unspecified psychosis not due to a substance or known physiological condition: Secondary | ICD-10-CM | POA: Diagnosis not present

## 2022-12-13 DIAGNOSIS — E538 Deficiency of other specified B group vitamins: Secondary | ICD-10-CM | POA: Diagnosis not present

## 2023-01-10 ENCOUNTER — Encounter: Payer: Self-pay | Admitting: Podiatry

## 2023-01-10 ENCOUNTER — Ambulatory Visit: Payer: PPO | Admitting: Podiatry

## 2023-01-10 DIAGNOSIS — M79675 Pain in left toe(s): Secondary | ICD-10-CM

## 2023-01-10 DIAGNOSIS — B351 Tinea unguium: Secondary | ICD-10-CM | POA: Diagnosis not present

## 2023-01-10 DIAGNOSIS — M79674 Pain in right toe(s): Secondary | ICD-10-CM

## 2023-01-10 DIAGNOSIS — L84 Corns and callosities: Secondary | ICD-10-CM

## 2023-01-10 NOTE — Progress Notes (Signed)
This patient returns to my office for at risk foot care.  This patient requires this care by a professional since this patient will be at risk due to having AKI.    This patient is unable to trim callus  herself since the patient cannot reach her feet.These callus  are painful walking and wearing shoes.  This patient presents for at risk foot care today. ? ?General Appearance  Alert, conversant and in no acute stress. ? ?Vascular  Dorsalis pedis and posterior tibial  pulses are palpable  bilaterally.  Capillary return is within normal limits  bilaterally. Temperature is within normal limits  bilaterally. ? ?Neurologic  Senn-Weinstein monofilament wire test within normal limits  bilaterally. Muscle power within normal limits bilaterally. ? ?Nails Thick disfigured discolored nails with subungual debris  from hallux to fifth toes bilaterally. No evidence of bacterial infection or drainage bilaterally. ? ?Orthopedic  No limitations of motion  feet .  No crepitus or effusions noted.  No bony pathology or digital deformities noted.  Plantar flexed fifth met  B/L ? ?Skin  normotropic skin with  noted bilaterally.  No signs of infections or ulcers noted.  Callus noted sub 1,5  Left foot . ? ?Callus  B/L.  Onychomycosis   ? ?Consent was obtained for treatment procedures..Debridement of callus with # 15 blade and then  filed with dremel without incident.  Debride nails with nail nipper and dremel tool. ? ? ?Return office visit   3  months                  Told patient to return for periodic foot care and evaluation due to potential at risk complications. ? ? ?Odeth Bry DPM  ?

## 2023-01-18 DIAGNOSIS — E538 Deficiency of other specified B group vitamins: Secondary | ICD-10-CM | POA: Diagnosis not present

## 2023-01-21 DIAGNOSIS — E7800 Pure hypercholesterolemia, unspecified: Secondary | ICD-10-CM | POA: Diagnosis not present

## 2023-01-21 DIAGNOSIS — E039 Hypothyroidism, unspecified: Secondary | ICD-10-CM | POA: Diagnosis not present

## 2023-01-21 DIAGNOSIS — M810 Age-related osteoporosis without current pathological fracture: Secondary | ICD-10-CM | POA: Diagnosis not present

## 2023-01-21 DIAGNOSIS — I129 Hypertensive chronic kidney disease with stage 1 through stage 4 chronic kidney disease, or unspecified chronic kidney disease: Secondary | ICD-10-CM | POA: Diagnosis not present

## 2023-02-08 DIAGNOSIS — E538 Deficiency of other specified B group vitamins: Secondary | ICD-10-CM | POA: Diagnosis not present

## 2023-02-26 DIAGNOSIS — H524 Presbyopia: Secondary | ICD-10-CM | POA: Diagnosis not present

## 2023-02-26 DIAGNOSIS — H52203 Unspecified astigmatism, bilateral: Secondary | ICD-10-CM | POA: Diagnosis not present

## 2023-02-26 DIAGNOSIS — H5213 Myopia, bilateral: Secondary | ICD-10-CM | POA: Diagnosis not present

## 2023-02-26 DIAGNOSIS — Z961 Presence of intraocular lens: Secondary | ICD-10-CM | POA: Diagnosis not present

## 2023-02-26 DIAGNOSIS — H18513 Endothelial corneal dystrophy, bilateral: Secondary | ICD-10-CM | POA: Diagnosis not present

## 2023-03-12 DIAGNOSIS — E538 Deficiency of other specified B group vitamins: Secondary | ICD-10-CM | POA: Diagnosis not present

## 2023-03-12 DIAGNOSIS — M542 Cervicalgia: Secondary | ICD-10-CM | POA: Diagnosis not present

## 2023-03-12 DIAGNOSIS — M549 Dorsalgia, unspecified: Secondary | ICD-10-CM | POA: Diagnosis not present

## 2023-03-17 DIAGNOSIS — Z9049 Acquired absence of other specified parts of digestive tract: Secondary | ICD-10-CM | POA: Diagnosis not present

## 2023-03-17 DIAGNOSIS — K219 Gastro-esophageal reflux disease without esophagitis: Secondary | ICD-10-CM | POA: Diagnosis not present

## 2023-03-17 DIAGNOSIS — N184 Chronic kidney disease, stage 4 (severe): Secondary | ICD-10-CM | POA: Diagnosis not present

## 2023-03-17 DIAGNOSIS — J309 Allergic rhinitis, unspecified: Secondary | ICD-10-CM | POA: Diagnosis not present

## 2023-03-17 DIAGNOSIS — I129 Hypertensive chronic kidney disease with stage 1 through stage 4 chronic kidney disease, or unspecified chronic kidney disease: Secondary | ICD-10-CM | POA: Diagnosis not present

## 2023-03-17 DIAGNOSIS — M5030 Other cervical disc degeneration, unspecified cervical region: Secondary | ICD-10-CM | POA: Diagnosis not present

## 2023-03-17 DIAGNOSIS — M47814 Spondylosis without myelopathy or radiculopathy, thoracic region: Secondary | ICD-10-CM | POA: Diagnosis not present

## 2023-03-17 DIAGNOSIS — M4186 Other forms of scoliosis, lumbar region: Secondary | ICD-10-CM | POA: Diagnosis not present

## 2023-03-17 DIAGNOSIS — M8008XD Age-related osteoporosis with current pathological fracture, vertebra(e), subsequent encounter for fracture with routine healing: Secondary | ICD-10-CM | POA: Diagnosis not present

## 2023-03-17 DIAGNOSIS — Z90710 Acquired absence of both cervix and uterus: Secondary | ICD-10-CM | POA: Diagnosis not present

## 2023-03-17 DIAGNOSIS — D519 Vitamin B12 deficiency anemia, unspecified: Secondary | ICD-10-CM | POA: Diagnosis not present

## 2023-03-17 DIAGNOSIS — R443 Hallucinations, unspecified: Secondary | ICD-10-CM | POA: Diagnosis not present

## 2023-03-17 DIAGNOSIS — M1030 Gout due to renal impairment, unspecified site: Secondary | ICD-10-CM | POA: Diagnosis not present

## 2023-03-17 DIAGNOSIS — M5136 Other intervertebral disc degeneration, lumbar region: Secondary | ICD-10-CM | POA: Diagnosis not present

## 2023-03-17 DIAGNOSIS — I700 Atherosclerosis of aorta: Secondary | ICD-10-CM | POA: Diagnosis not present

## 2023-03-17 DIAGNOSIS — Z9181 History of falling: Secondary | ICD-10-CM | POA: Diagnosis not present

## 2023-03-17 DIAGNOSIS — E785 Hyperlipidemia, unspecified: Secondary | ICD-10-CM | POA: Diagnosis not present

## 2023-03-17 DIAGNOSIS — Z7982 Long term (current) use of aspirin: Secondary | ICD-10-CM | POA: Diagnosis not present

## 2023-03-17 DIAGNOSIS — M47816 Spondylosis without myelopathy or radiculopathy, lumbar region: Secondary | ICD-10-CM | POA: Diagnosis not present

## 2023-03-17 DIAGNOSIS — D631 Anemia in chronic kidney disease: Secondary | ICD-10-CM | POA: Diagnosis not present

## 2023-03-17 DIAGNOSIS — M4184 Other forms of scoliosis, thoracic region: Secondary | ICD-10-CM | POA: Diagnosis not present

## 2023-03-23 DIAGNOSIS — M5030 Other cervical disc degeneration, unspecified cervical region: Secondary | ICD-10-CM | POA: Diagnosis not present

## 2023-03-23 DIAGNOSIS — M5136 Other intervertebral disc degeneration, lumbar region: Secondary | ICD-10-CM | POA: Diagnosis not present

## 2023-03-23 DIAGNOSIS — D519 Vitamin B12 deficiency anemia, unspecified: Secondary | ICD-10-CM | POA: Diagnosis not present

## 2023-03-23 DIAGNOSIS — M8008XD Age-related osteoporosis with current pathological fracture, vertebra(e), subsequent encounter for fracture with routine healing: Secondary | ICD-10-CM | POA: Diagnosis not present

## 2023-04-11 ENCOUNTER — Ambulatory Visit (INDEPENDENT_AMBULATORY_CARE_PROVIDER_SITE_OTHER): Payer: PPO | Admitting: Podiatry

## 2023-04-11 ENCOUNTER — Encounter: Payer: Self-pay | Admitting: Podiatry

## 2023-04-11 VITALS — BP 141/63

## 2023-04-11 DIAGNOSIS — M79674 Pain in right toe(s): Secondary | ICD-10-CM | POA: Diagnosis not present

## 2023-04-11 DIAGNOSIS — L84 Corns and callosities: Secondary | ICD-10-CM

## 2023-04-11 DIAGNOSIS — M79675 Pain in left toe(s): Secondary | ICD-10-CM | POA: Diagnosis not present

## 2023-04-11 DIAGNOSIS — B351 Tinea unguium: Secondary | ICD-10-CM | POA: Diagnosis not present

## 2023-04-11 NOTE — Progress Notes (Signed)
This patient returns to my office for at risk foot care.  This patient requires this care by a professional since this patient will be at risk due to having AKI.    This patient is unable to trim callus  herself since the patient cannot reach her feet.These callus  are painful walking and wearing shoes.  This patient presents for at risk foot care today. ? ?General Appearance  Alert, conversant and in no acute stress. ? ?Vascular  Dorsalis pedis and posterior tibial  pulses are palpable  bilaterally.  Capillary return is within normal limits  bilaterally. Temperature is within normal limits  bilaterally. ? ?Neurologic  Senn-Weinstein monofilament wire test within normal limits  bilaterally. Muscle power within normal limits bilaterally. ? ?Nails Thick disfigured discolored nails with subungual debris  from hallux to fifth toes bilaterally. No evidence of bacterial infection or drainage bilaterally. ? ?Orthopedic  No limitations of motion  feet .  No crepitus or effusions noted.  No bony pathology or digital deformities noted.  Plantar flexed fifth met  B/L ? ?Skin  normotropic skin with  noted bilaterally.  No signs of infections or ulcers noted.  Callus noted sub 1,5  Left foot . ? ?Callus  B/L.  Onychomycosis   ? ?Consent was obtained for treatment procedures..Debridement of callus with # 15 blade and then  filed with dremel without incident.  Debride nails with nail nipper and dremel tool. ? ? ?Return office visit   3  months                  Told patient to return for periodic foot care and evaluation due to potential at risk complications. ? ? ?Jennifer Patel DPM  ?

## 2023-04-12 ENCOUNTER — Ambulatory Visit: Payer: PPO | Admitting: Podiatry

## 2023-04-12 DIAGNOSIS — E538 Deficiency of other specified B group vitamins: Secondary | ICD-10-CM | POA: Diagnosis not present

## 2023-05-14 DIAGNOSIS — E538 Deficiency of other specified B group vitamins: Secondary | ICD-10-CM | POA: Diagnosis not present

## 2023-05-16 DIAGNOSIS — M1711 Unilateral primary osteoarthritis, right knee: Secondary | ICD-10-CM | POA: Diagnosis not present

## 2023-05-22 DIAGNOSIS — Z13220 Encounter for screening for lipoid disorders: Secondary | ICD-10-CM | POA: Diagnosis not present

## 2023-05-22 DIAGNOSIS — E039 Hypothyroidism, unspecified: Secondary | ICD-10-CM | POA: Diagnosis not present

## 2023-05-22 DIAGNOSIS — Z79899 Other long term (current) drug therapy: Secondary | ICD-10-CM | POA: Diagnosis not present

## 2023-05-22 DIAGNOSIS — R7309 Other abnormal glucose: Secondary | ICD-10-CM | POA: Diagnosis not present

## 2023-05-22 DIAGNOSIS — R946 Abnormal results of thyroid function studies: Secondary | ICD-10-CM | POA: Diagnosis not present

## 2023-05-22 DIAGNOSIS — Z0000 Encounter for general adult medical examination without abnormal findings: Secondary | ICD-10-CM | POA: Diagnosis not present

## 2023-05-22 DIAGNOSIS — G3184 Mild cognitive impairment, so stated: Secondary | ICD-10-CM | POA: Diagnosis not present

## 2023-05-22 DIAGNOSIS — I129 Hypertensive chronic kidney disease with stage 1 through stage 4 chronic kidney disease, or unspecified chronic kidney disease: Secondary | ICD-10-CM | POA: Diagnosis not present

## 2023-05-22 DIAGNOSIS — E538 Deficiency of other specified B group vitamins: Secondary | ICD-10-CM | POA: Diagnosis not present

## 2023-05-22 DIAGNOSIS — E559 Vitamin D deficiency, unspecified: Secondary | ICD-10-CM | POA: Diagnosis not present

## 2023-06-04 DIAGNOSIS — D692 Other nonthrombocytopenic purpura: Secondary | ICD-10-CM | POA: Diagnosis not present

## 2023-06-04 DIAGNOSIS — I10 Essential (primary) hypertension: Secondary | ICD-10-CM | POA: Diagnosis not present

## 2023-06-04 DIAGNOSIS — Z6820 Body mass index (BMI) 20.0-20.9, adult: Secondary | ICD-10-CM | POA: Diagnosis not present

## 2023-06-04 DIAGNOSIS — R443 Hallucinations, unspecified: Secondary | ICD-10-CM | POA: Diagnosis not present

## 2023-06-06 DIAGNOSIS — Z1231 Encounter for screening mammogram for malignant neoplasm of breast: Secondary | ICD-10-CM | POA: Diagnosis not present

## 2023-06-18 DIAGNOSIS — E538 Deficiency of other specified B group vitamins: Secondary | ICD-10-CM | POA: Diagnosis not present

## 2023-06-27 DIAGNOSIS — M1711 Unilateral primary osteoarthritis, right knee: Secondary | ICD-10-CM | POA: Diagnosis not present

## 2023-07-13 ENCOUNTER — Encounter: Payer: Self-pay | Admitting: Podiatry

## 2023-07-13 ENCOUNTER — Ambulatory Visit: Payer: PPO | Admitting: Podiatry

## 2023-07-13 DIAGNOSIS — M79675 Pain in left toe(s): Secondary | ICD-10-CM

## 2023-07-13 DIAGNOSIS — L84 Corns and callosities: Secondary | ICD-10-CM | POA: Diagnosis not present

## 2023-07-13 DIAGNOSIS — B351 Tinea unguium: Secondary | ICD-10-CM | POA: Diagnosis not present

## 2023-07-13 DIAGNOSIS — M79674 Pain in right toe(s): Secondary | ICD-10-CM | POA: Diagnosis not present

## 2023-07-13 DIAGNOSIS — N179 Acute kidney failure, unspecified: Secondary | ICD-10-CM

## 2023-07-13 NOTE — Progress Notes (Addendum)
This patient returns to my office for at risk foot care.  This patient requires this care by a professional since this patient will be at risk due to having AKI.    This patient is unable to trim callus  herself since the patient cannot reach her feet.These callus  are painful walking and wearing shoes.  This patient presents for at risk foot care today.  General Appearance  Alert, conversant and in no acute stress.  Vascular  Dorsalis pedis and posterior tibial  pulses are palpable  bilaterally.  Capillary return is within normal limits  bilaterally. Temperature is within normal limits  bilaterally.  Neurologic  Senn-Weinstein monofilament wire test within normal limits  bilaterally. Muscle power within normal limits bilaterally.  Nails Thick disfigured discolored nails with subungual debris  from hallux to fifth toes bilaterally. No evidence of bacterial infection or drainage bilaterally.  Orthopedic  No limitations of motion  feet .  No crepitus or effusions noted.  No bony pathology or digital deformities noted.  Plantar flexed fifth met  B/L  Hammer toes 2-4  B/L.  Skin  normotropic skin with  noted bilaterally.  No signs of infections or ulcers noted.  Callus noted sub 1  Left foot .and sub 5 right.  Callus  B/L.  Onychomycosis    Consent was obtained for treatment procedures..Debridement of callus with # 15 blade and then  filed with dremel without incident.  Debride nails with nail nipper and dremel tool.   Return office visit   4   months                  Told patient to return for periodic foot care and evaluation due to potential at risk complications.   Helane Gunther DPM

## 2023-07-24 DIAGNOSIS — F29 Unspecified psychosis not due to a substance or known physiological condition: Secondary | ICD-10-CM | POA: Diagnosis not present

## 2023-08-13 DIAGNOSIS — S72451D Displaced supracondylar fracture without intracondylar extension of lower end of right femur, subsequent encounter for closed fracture with routine healing: Secondary | ICD-10-CM | POA: Diagnosis not present

## 2023-08-13 DIAGNOSIS — M1711 Unilateral primary osteoarthritis, right knee: Secondary | ICD-10-CM | POA: Diagnosis not present

## 2023-08-24 DIAGNOSIS — E538 Deficiency of other specified B group vitamins: Secondary | ICD-10-CM | POA: Diagnosis not present

## 2023-08-24 DIAGNOSIS — E559 Vitamin D deficiency, unspecified: Secondary | ICD-10-CM | POA: Diagnosis not present

## 2023-08-24 DIAGNOSIS — E211 Secondary hyperparathyroidism, not elsewhere classified: Secondary | ICD-10-CM | POA: Diagnosis not present

## 2023-08-24 DIAGNOSIS — E039 Hypothyroidism, unspecified: Secondary | ICD-10-CM | POA: Diagnosis not present

## 2023-08-28 DIAGNOSIS — I129 Hypertensive chronic kidney disease with stage 1 through stage 4 chronic kidney disease, or unspecified chronic kidney disease: Secondary | ICD-10-CM | POA: Diagnosis not present

## 2023-08-28 DIAGNOSIS — Z862 Personal history of diseases of the blood and blood-forming organs and certain disorders involving the immune mechanism: Secondary | ICD-10-CM | POA: Diagnosis not present

## 2023-08-28 DIAGNOSIS — R443 Hallucinations, unspecified: Secondary | ICD-10-CM | POA: Diagnosis not present

## 2023-08-28 DIAGNOSIS — E039 Hypothyroidism, unspecified: Secondary | ICD-10-CM | POA: Diagnosis not present

## 2023-08-28 DIAGNOSIS — E7800 Pure hypercholesterolemia, unspecified: Secondary | ICD-10-CM | POA: Diagnosis not present

## 2023-08-28 DIAGNOSIS — M810 Age-related osteoporosis without current pathological fracture: Secondary | ICD-10-CM | POA: Diagnosis not present

## 2023-08-28 DIAGNOSIS — E538 Deficiency of other specified B group vitamins: Secondary | ICD-10-CM | POA: Diagnosis not present

## 2023-09-27 DIAGNOSIS — E538 Deficiency of other specified B group vitamins: Secondary | ICD-10-CM | POA: Diagnosis not present

## 2023-10-19 ENCOUNTER — Emergency Department (HOSPITAL_COMMUNITY): Payer: PPO

## 2023-10-19 ENCOUNTER — Emergency Department (HOSPITAL_COMMUNITY)
Admission: EM | Admit: 2023-10-19 | Discharge: 2023-10-19 | Disposition: A | Discharge status: Home / Self Care | Payer: PPO | Attending: Emergency Medicine | Admitting: Emergency Medicine

## 2023-10-19 ENCOUNTER — Encounter (HOSPITAL_COMMUNITY): Payer: Self-pay | Admitting: Emergency Medicine

## 2023-10-19 DIAGNOSIS — I10 Essential (primary) hypertension: Secondary | ICD-10-CM | POA: Diagnosis not present

## 2023-10-19 DIAGNOSIS — B9789 Other viral agents as the cause of diseases classified elsewhere: Secondary | ICD-10-CM | POA: Diagnosis not present

## 2023-10-19 DIAGNOSIS — J069 Acute upper respiratory infection, unspecified: Secondary | ICD-10-CM

## 2023-10-19 DIAGNOSIS — J3489 Other specified disorders of nose and nasal sinuses: Secondary | ICD-10-CM | POA: Diagnosis not present

## 2023-10-19 DIAGNOSIS — Z20822 Contact with and (suspected) exposure to covid-19: Secondary | ICD-10-CM | POA: Insufficient documentation

## 2023-10-19 DIAGNOSIS — R0609 Other forms of dyspnea: Secondary | ICD-10-CM | POA: Insufficient documentation

## 2023-10-19 DIAGNOSIS — R1084 Generalized abdominal pain: Secondary | ICD-10-CM | POA: Diagnosis not present

## 2023-10-19 DIAGNOSIS — R0981 Nasal congestion: Secondary | ICD-10-CM | POA: Diagnosis not present

## 2023-10-19 DIAGNOSIS — R059 Cough, unspecified: Secondary | ICD-10-CM | POA: Insufficient documentation

## 2023-10-19 DIAGNOSIS — K449 Diaphragmatic hernia without obstruction or gangrene: Secondary | ICD-10-CM | POA: Diagnosis not present

## 2023-10-19 DIAGNOSIS — R0689 Other abnormalities of breathing: Secondary | ICD-10-CM | POA: Diagnosis not present

## 2023-10-19 DIAGNOSIS — I517 Cardiomegaly: Secondary | ICD-10-CM | POA: Diagnosis not present

## 2023-10-19 DIAGNOSIS — I700 Atherosclerosis of aorta: Secondary | ICD-10-CM | POA: Diagnosis not present

## 2023-10-19 LAB — CBC
HCT: 33.8 % — ABNORMAL LOW (ref 36.0–46.0)
Hemoglobin: 11 g/dL — ABNORMAL LOW (ref 12.0–15.0)
MCH: 30.8 pg (ref 26.0–34.0)
MCHC: 32.5 g/dL (ref 30.0–36.0)
MCV: 94.7 fL (ref 80.0–100.0)
Platelets: 239 10*3/uL (ref 150–400)
RBC: 3.57 MIL/uL — ABNORMAL LOW (ref 3.87–5.11)
RDW: 13.2 % (ref 11.5–15.5)
WBC: 7.5 10*3/uL (ref 4.0–10.5)
nRBC: 0 % (ref 0.0–0.2)

## 2023-10-19 LAB — COMPREHENSIVE METABOLIC PANEL
ALT: 11 U/L (ref 0–44)
AST: 21 U/L (ref 15–41)
Albumin: 3.4 g/dL — ABNORMAL LOW (ref 3.5–5.0)
Alkaline Phosphatase: 83 U/L (ref 38–126)
Anion gap: 10 (ref 5–15)
BUN: 28 mg/dL — ABNORMAL HIGH (ref 8–23)
CO2: 20 mmol/L — ABNORMAL LOW (ref 22–32)
Calcium: 9 mg/dL (ref 8.9–10.3)
Chloride: 107 mmol/L (ref 98–111)
Creatinine, Ser: 1.41 mg/dL — ABNORMAL HIGH (ref 0.44–1.00)
GFR, Estimated: 36 mL/min — ABNORMAL LOW (ref >60)
Glucose, Bld: 104 mg/dL — ABNORMAL HIGH (ref 70–99)
Potassium: 4.7 mmol/L (ref 3.5–5.1)
Sodium: 137 mmol/L (ref 135–145)
Total Bilirubin: 0.4 mg/dL (ref <1.2)
Total Protein: 6.6 g/dL (ref 6.5–8.1)

## 2023-10-19 LAB — SARS CORONAVIRUS 2 BY RT PCR: SARS Coronavirus 2 by RT PCR: NEGATIVE

## 2023-10-19 MED ORDER — BENZONATATE 100 MG PO CAPS: 100.0000 mg | ORAL_CAPSULE | Freq: Three times a day (TID) | ORAL | 0 refills | Status: DC

## 2023-10-19 NOTE — ED Provider Notes (Signed)
[place] EMERGENCY DEPARTMENT AT [facility] Provider Note   CSN: [id number] Arrival date & time: 10/19/23  1055     History  Chief Complaint  Patient presents with   Cough    Jennifer Patel is a 89 y.o. female.  89 year old female here today for cough, runny nose, dyspnea on exertion.  Symptoms ongoing for 3 to 4 days.   Cough      Home Medications Prior to Admission medications   Medication Sig Start Date End Date Taking? Authorizing Provider  acetaminophen (TYLENOL) 500 MG tablet Take 1 tablet (500 mg total) by mouth every 6 (six) hours as needed (pain). 03/31/20   Edmon Crape C, PA-C  allopurinol (ZYLOPRIM) 100 MG tablet Take 2 tablets (200 mg total) by mouth daily. 03/31/20   Edmon Crape C, PA-C  atorvastatin (LIPITOR) 10 MG tablet Take 1 tablet (10 mg total) by mouth daily. Reported on 11/11/2015 03/31/20   Roena Malady, PA-C  cefdinir (OMNICEF) 300 MG capsule Take 1 capsule (300 mg total) by mouth 2 (two) times daily. 12/06/21   Lorre Nick, MD  Cyanocobalamin (VITAMIN B-12 IJ) Inject as directed every 30 (thirty) days. Next injection due 05/24/15 at Dr. Marylen Ponto office    [provider]  enoxaparin (LOVENOX) 30 MG/0.3ML injection Inject 0.3 mLs (30 mg total) into the skin daily. 03/15/20 04/14/20  West Bali, PA-C  furosemide (LASIX) 20 MG tablet Take 1 tablet (20 mg total) by mouth every Monday, Wednesday, and Friday. 03/31/20   Edmon Crape C, PA-C  hydrALAZINE (APRESOLINE) 25 MG tablet Take 1 tablet (25 mg total) by mouth 3 (three) times daily. 03/31/20   Roena Malady, PA-C  HYDROcodone-acetaminophen (NORCO/VICODIN) 5-325 MG tablet Take 1 tablet by mouth every 6 (six) hours as needed for severe pain. 03/14/20   West Bali, PA-C  lansoprazole (PREVACID) 15 MG capsule Take 1 capsule (15 mg total) by mouth daily at 12 noon. 03/31/20   Roena Malady, PA-C  levothyroxine (SYNTHROID) 75 MCG tablet Take 1 tablet (75 mcg total) by mouth daily. 03/31/20    Edmon Crape C, PA-C  Nebivolol HCl (BYSTOLIC) 20 MG TABS Take 1 tablet (20 mg total) by mouth daily. 03/31/20   Edmon Crape C, PA-C  Polyethyl Glycol-Propyl Glycol (SYSTANE OP) Place 1 drop into both eyes 2 (two) times daily as needed (dry eyes).    [provider]  spironolactone (ALDACTONE) 25 MG tablet Take 0.5 tablets (12.5 mg total) by mouth daily. 03/31/20   Edmon Crape C, PA-C      Allergies    Clarithromycin and Prednisone    Review of Systems   Review of Systems  Respiratory:  Positive for cough.     Physical Exam Updated Vital Signs BP (!) 153/74 (BP Location: Right Arm)   Pulse 82   Temp 98.3 F (36.8 C) (Oral)   Resp 18   SpO2 97%  Physical Exam Vitals reviewed.  HENT:     Head: Normocephalic and atraumatic.     Right Ear: External ear normal.     Left Ear: External ear normal.     Nose: Rhinorrhea present.  Eyes:     Pupils: Pupils are equal, round, and reactive to light.  Cardiovascular:     Rate and Rhythm: Normal rate.  Pulmonary:     Effort: Pulmonary effort is normal. No respiratory distress.     Breath sounds: No wheezing, rhonchi or rales.  Abdominal:     Palpations:  Abdomen is soft.  Musculoskeletal:     Cervical back: Normal range of motion.  Skin:    General: Skin is warm and dry.     Findings: No rash.  Neurological:     General: No focal deficit present.     Mental Status: She is alert.     Gait: Gait normal.     ED Results / Procedures / Treatments   Labs (all labs ordered are listed, but only abnormal results are displayed) Labs Reviewed  CBC - Abnormal; Notable for the following components:      Result Value   RBC 3.57 (*)    Hemoglobin 11.0 (*)    HCT 33.8 (*)    All other components within normal limits  COMPREHENSIVE METABOLIC PANEL - Abnormal; Notable for the following components:   CO2 20 (*)    Glucose, Bld 104 (*)    BUN 28 (*)    Creatinine, Ser 1.41 (*)    Albumin 3.4 (*)    GFR, Estimated 36 (*)    All  other components within normal limits  SARS CORONAVIRUS 2 BY RT PCR    EKG None  Radiology DG Chest 2 View Result Date: 10/19/2023 CLINICAL DATA:  Cough. EXAM: CHEST - 2 VIEW COMPARISON:  Chest radiograph dated Mar 11, 2020. FINDINGS: The heart is borderline enlarged, unchanged. Aortic atherosclerosis. A large hiatal hernia is again noted. No focal consolidation, pleural effusion, or pneumothorax. Diffuse osseous demineralization. Dextrocurvature and exaggerated kyphosis of the thoracic spine. IMPRESSION: 1. No acute cardiopulmonary findings. 2. Large hiatal hernia. Electronically Signed   By: Hart Robinsons M.D.   On: 10/19/2023 12:11    Procedures Procedures    Medications Ordered in ED Medications - No data to display  ED Course/ Medical Decision Making/ A&P                                 Medical Decision Making 89 year old female here today for runny nose and cough.  Differential diagnose include viral syndrome, less likely pneumonia, less likely pulmonary edema.  Plan-my dependent review the patient's EKG shows no ST segment pression's elevations, no T wave inversions, no evidence of acute ischemia.  My independent review the patient's chest x-ray shows no pneumonia.  I reviewed the patient's labs, no leukocytosis, no anemia, kidney function at baseline.  On exam, patient with clear lung sounds, her vital signs are normal.  This appears to be viral syndrome.  Discussed this with the patient, she is going to return home and follow-up with her PCP.  Return precautions discussed with the patient at bed side.  Amount and/or Complexity of Data Reviewed Labs: ordered. Radiology: ordered.           Final Clinical Impression(s) / ED Diagnoses Final diagnoses:  None    Rx / DC Orders ED Discharge Orders     None         Arletha Pili, DO 10/19/23 1701

## 2023-10-19 NOTE — ED Provider Triage Note (Signed)
Emergency Medicine Provider Triage Evaluation Note  Jennifer Patel , a 89 y.o. female  was evaluated in triage.  Pt complains of cough.  Review of Systems  Positive:  Negative:   Physical Exam  BP (!) 151/68   Pulse 72   Temp 98.2 F (36.8 C) (Oral)   Resp 19   SpO2 98%  Gen:   Awake, no distress   Resp:  Normal effort  MSK:   Moves extremities without difficulty  Other:    Medical Decision Making  Medically screening exam initiated at 12:13 PM.  Appropriate orders placed.  Jennifer Patel was informed that the remainder of the evaluation will be completed by another provider, this initial triage assessment does not replace that evaluation, and the importance of remaining in the ED until their evaluation is complete.  Cough x4 days. Subjective fevers - but thermometer at home did not read fever. Also with DOE.  Denies fever, chest pain, nausea, vomiting, diarrhea.   Dorthy Cooler, [place] 10/19/23 1215

## 2023-10-19 NOTE — ED Triage Notes (Signed)
Pt here from home with c/o cough times 4 days , no fevers

## 2023-10-19 NOTE — Discharge Instructions (Signed)
You likely have a viral upper respiratory infection.  The symptoms will probably last for 1 to 2 weeks.  You can take [employer], use over-the-counter decongestion as necessary.  Please follow-up with your primary care doctor within 1 week.  Return to the emergency department if you develop fever, difficulty with your breathing, or feel confused.

## 2023-10-19 NOTE — ED Notes (Signed)
Ptar called, within the hour

## 2023-11-09 ENCOUNTER — Ambulatory Visit: Payer: PPO | Admitting: Podiatry

## 2023-11-12 ENCOUNTER — Ambulatory Visit: Payer: PPO | Admitting: Podiatry

## 2023-11-12 ENCOUNTER — Encounter: Payer: Self-pay | Admitting: Podiatry

## 2023-11-12 DIAGNOSIS — B351 Tinea unguium: Secondary | ICD-10-CM

## 2023-11-12 DIAGNOSIS — M79674 Pain in right toe(s): Secondary | ICD-10-CM | POA: Diagnosis not present

## 2023-11-12 DIAGNOSIS — M79675 Pain in left toe(s): Secondary | ICD-10-CM | POA: Diagnosis not present

## 2023-11-12 DIAGNOSIS — L84 Corns and callosities: Secondary | ICD-10-CM

## 2023-11-12 NOTE — Progress Notes (Signed)
This patient returns to my office for at risk foot care.  This patient requires this care by a professional since this patient will be at risk due to having AKI.    This patient is unable to trim callus  herself since the patient cannot reach her feet.These callus  are painful walking and wearing shoes.  This patient presents for at risk foot care today.  General Appearance  Alert, conversant and in no acute stress.  Vascular  Dorsalis pedis and posterior tibial  pulses are palpable  bilaterally.  Capillary return is within normal limits  bilaterally. Temperature is within normal limits  bilaterally.  Neurologic  Senn-Weinstein monofilament wire test within normal limits  bilaterally. Muscle power within normal limits bilaterally.  Nails Thick disfigured discolored nails with subungual debris  from hallux to fifth toes bilaterally. No evidence of bacterial infection or drainage bilaterally.  Orthopedic  No limitations of motion  feet .  No crepitus or effusions noted.  No bony pathology or digital deformities noted.  Plantar flexed fifth met  B/L  Hammer toes 2-4  B/L.  Skin  normotropic skin with  noted bilaterally.  No signs of infections or ulcers noted.  Callus noted sub 1  Left foot .and sub 5 right.  Callus  B/L.  Onychomycosis    Consent was obtained for treatment procedures..Debridement of callus with # 15 blade and then  filed with dremel without incident.  Debride nails with nail nipper and dremel tool.   Return office visit   4   months                  Told patient to return for periodic foot care and evaluation due to potential at risk complications.   Helane Gunther DPM

## 2023-11-22 DIAGNOSIS — E538 Deficiency of other specified B group vitamins: Secondary | ICD-10-CM | POA: Diagnosis not present

## 2023-11-22 DIAGNOSIS — G3184 Mild cognitive impairment, so stated: Secondary | ICD-10-CM | POA: Diagnosis not present

## 2023-11-22 DIAGNOSIS — M810 Age-related osteoporosis without current pathological fracture: Secondary | ICD-10-CM | POA: Diagnosis not present

## 2023-11-22 DIAGNOSIS — R443 Hallucinations, unspecified: Secondary | ICD-10-CM | POA: Diagnosis not present

## 2023-11-22 DIAGNOSIS — E039 Hypothyroidism, unspecified: Secondary | ICD-10-CM | POA: Diagnosis not present

## 2023-11-22 DIAGNOSIS — E789 Disorder of lipoprotein metabolism, unspecified: Secondary | ICD-10-CM | POA: Diagnosis not present

## 2023-11-22 DIAGNOSIS — Z8719 Personal history of other diseases of the digestive system: Secondary | ICD-10-CM | POA: Diagnosis not present

## 2023-11-22 DIAGNOSIS — E559 Vitamin D deficiency, unspecified: Secondary | ICD-10-CM | POA: Diagnosis not present

## 2023-11-22 DIAGNOSIS — I129 Hypertensive chronic kidney disease with stage 1 through stage 4 chronic kidney disease, or unspecified chronic kidney disease: Secondary | ICD-10-CM | POA: Diagnosis not present

## 2023-12-24 DIAGNOSIS — R0602 Shortness of breath: Secondary | ICD-10-CM | POA: Diagnosis not present

## 2023-12-26 DIAGNOSIS — R0602 Shortness of breath: Secondary | ICD-10-CM | POA: Diagnosis not present

## 2023-12-27 DIAGNOSIS — H8113 Benign paroxysmal vertigo, bilateral: Secondary | ICD-10-CM | POA: Diagnosis not present

## 2023-12-27 DIAGNOSIS — R293 Abnormal posture: Secondary | ICD-10-CM | POA: Diagnosis not present

## 2023-12-27 DIAGNOSIS — R262 Difficulty in walking, not elsewhere classified: Secondary | ICD-10-CM | POA: Diagnosis not present

## 2023-12-27 DIAGNOSIS — M4003 Postural kyphosis, cervicothoracic region: Secondary | ICD-10-CM | POA: Diagnosis not present

## 2024-01-20 ENCOUNTER — Emergency Department (HOSPITAL_COMMUNITY)

## 2024-01-20 ENCOUNTER — Encounter (HOSPITAL_COMMUNITY): Payer: Self-pay

## 2024-01-20 ENCOUNTER — Other Ambulatory Visit: Payer: Self-pay

## 2024-01-20 ENCOUNTER — Emergency Department (HOSPITAL_COMMUNITY)
Admission: EM | Admit: 2024-01-20 | Discharge: 2024-01-20 | Disposition: A | Discharge status: Home / Self Care | Attending: Emergency Medicine | Admitting: Emergency Medicine

## 2024-01-20 DIAGNOSIS — I959 Hypotension, unspecified: Secondary | ICD-10-CM | POA: Diagnosis not present

## 2024-01-20 DIAGNOSIS — I10 Essential (primary) hypertension: Secondary | ICD-10-CM | POA: Insufficient documentation

## 2024-01-20 DIAGNOSIS — R1084 Generalized abdominal pain: Secondary | ICD-10-CM | POA: Diagnosis not present

## 2024-01-20 DIAGNOSIS — E039 Hypothyroidism, unspecified: Secondary | ICD-10-CM | POA: Diagnosis not present

## 2024-01-20 DIAGNOSIS — K529 Noninfective gastroenteritis and colitis, unspecified: Secondary | ICD-10-CM | POA: Diagnosis not present

## 2024-01-20 DIAGNOSIS — N3000 Acute cystitis without hematuria: Secondary | ICD-10-CM

## 2024-01-20 DIAGNOSIS — R110 Nausea: Secondary | ICD-10-CM | POA: Diagnosis not present

## 2024-01-20 DIAGNOSIS — N281 Cyst of kidney, acquired: Secondary | ICD-10-CM | POA: Diagnosis not present

## 2024-01-20 DIAGNOSIS — R1111 Vomiting without nausea: Secondary | ICD-10-CM | POA: Diagnosis not present

## 2024-01-20 DIAGNOSIS — K551 Chronic vascular disorders of intestine: Secondary | ICD-10-CM | POA: Diagnosis not present

## 2024-01-20 DIAGNOSIS — K449 Diaphragmatic hernia without obstruction or gangrene: Secondary | ICD-10-CM | POA: Diagnosis not present

## 2024-01-20 DIAGNOSIS — Z79899 Other long term (current) drug therapy: Secondary | ICD-10-CM | POA: Diagnosis not present

## 2024-01-20 DIAGNOSIS — K5730 Diverticulosis of large intestine without perforation or abscess without bleeding: Secondary | ICD-10-CM | POA: Diagnosis not present

## 2024-01-20 DIAGNOSIS — R112 Nausea with vomiting, unspecified: Secondary | ICD-10-CM | POA: Diagnosis present

## 2024-01-20 DIAGNOSIS — R197 Diarrhea, unspecified: Secondary | ICD-10-CM | POA: Diagnosis not present

## 2024-01-20 LAB — COMPREHENSIVE METABOLIC PANEL WITH GFR
ALT: 11 U/L (ref 0–44)
AST: 19 U/L (ref 15–41)
Albumin: 3.1 g/dL — ABNORMAL LOW (ref 3.5–5.0)
Alkaline Phosphatase: 55 U/L (ref 38–126)
Anion gap: 8 (ref 5–15)
BUN: 34 mg/dL — ABNORMAL HIGH (ref 8–23)
CO2: 19 mmol/L — ABNORMAL LOW (ref 22–32)
Calcium: 8.1 mg/dL — ABNORMAL LOW (ref 8.9–10.3)
Chloride: 112 mmol/L — ABNORMAL HIGH (ref 98–111)
Creatinine, Ser: 1.33 mg/dL — ABNORMAL HIGH (ref 0.44–1.00)
GFR, Estimated: 38 mL/min — ABNORMAL LOW (ref >60)
Glucose, Bld: 128 mg/dL — ABNORMAL HIGH (ref 70–99)
Potassium: 4.2 mmol/L (ref 3.5–5.1)
Sodium: 139 mmol/L (ref 135–145)
Total Bilirubin: 0.4 mg/dL (ref 0.0–1.2)
Total Protein: 6.1 g/dL — ABNORMAL LOW (ref 6.5–8.1)

## 2024-01-20 LAB — CBC WITH DIFFERENTIAL/PLATELET
Abs Immature Granulocytes: 0.03 10*3/uL (ref 0.00–0.07)
Basophils Absolute: 0 10*3/uL (ref 0.0–0.1)
Basophils Relative: 0 %
Eosinophils Absolute: 0 10*3/uL (ref 0.0–0.5)
Eosinophils Relative: 0 %
HCT: 31.6 % — ABNORMAL LOW (ref 36.0–46.0)
Hemoglobin: 9.9 g/dL — ABNORMAL LOW (ref 12.0–15.0)
Immature Granulocytes: 0 %
Lymphocytes Relative: 2 %
Lymphs Abs: 0.2 10*3/uL — ABNORMAL LOW (ref 0.7–4.0)
MCH: 30.2 pg (ref 26.0–34.0)
MCHC: 31.3 g/dL (ref 30.0–36.0)
MCV: 96.3 fL (ref 80.0–100.0)
Monocytes Absolute: 0.1 10*3/uL (ref 0.1–1.0)
Monocytes Relative: 1 %
Neutro Abs: 9.7 10*3/uL — ABNORMAL HIGH (ref 1.7–7.7)
Neutrophils Relative %: 97 %
Platelets: 218 10*3/uL (ref 150–400)
RBC: 3.28 MIL/uL — ABNORMAL LOW (ref 3.87–5.11)
RDW: 13.4 % (ref 11.5–15.5)
WBC: 10 10*3/uL (ref 4.0–10.5)
nRBC: 0 % (ref 0.0–0.2)

## 2024-01-20 LAB — URINALYSIS, ROUTINE W REFLEX MICROSCOPIC
Bilirubin Urine: NEGATIVE
Glucose, UA: NEGATIVE mg/dL
Hgb urine dipstick: NEGATIVE
Ketones, ur: NEGATIVE mg/dL
Nitrite: NEGATIVE
Protein, ur: NEGATIVE mg/dL
Specific Gravity, Urine: 1.017 (ref 1.005–1.030)
pH: 5 (ref 5.0–8.0)

## 2024-01-20 LAB — LIPASE, BLOOD: Lipase: 32 U/L (ref 11–51)

## 2024-01-20 MED ORDER — ALUM & MAG HYDROXIDE-SIMETH 200-200-20 MG/5ML PO SUSP
15.0000 mL | Freq: Once | ORAL | Status: AC
  Administered 2024-01-20: 15 mL via ORAL
  Filled 2024-01-20: qty 30

## 2024-01-20 MED ORDER — FAMOTIDINE IN NACL 20-0.9 MG/50ML-% IV SOLN
20.0000 mg | Freq: Once | INTRAVENOUS | Status: AC
  Administered 2024-01-20: 20 mg via INTRAVENOUS
  Filled 2024-01-20: qty 50

## 2024-01-20 MED ORDER — LOPERAMIDE HCL 2 MG PO CAPS: 2.0000 mg | ORAL_CAPSULE | Freq: Four times a day (QID) | ORAL | 0 refills | Status: AC | PRN

## 2024-01-20 MED ORDER — IOHEXOL 350 MG/ML SOLN
55.0000 mL | Freq: Once | INTRAVENOUS | Status: AC | PRN
  Administered 2024-01-20: 55 mL via INTRAVENOUS

## 2024-01-20 MED ORDER — CEPHALEXIN 500 MG PO CAPS: 500.0000 mg | ORAL_CAPSULE | Freq: Three times a day (TID) | ORAL | 0 refills | Status: AC

## 2024-01-20 MED ORDER — ONDANSETRON HCL 4 MG PO TABS: 4.0000 mg | ORAL_TABLET | ORAL | 0 refills | Status: AC | PRN

## 2024-01-20 MED ORDER — SODIUM CHLORIDE 0.9 % IV BOLUS
1000.0000 mL | Freq: Once | INTRAVENOUS | Status: AC
  Administered 2024-01-20: 1000 mL via INTRAVENOUS

## 2024-01-20 MED ORDER — SODIUM CHLORIDE 0.9 % IV SOLN
1.0000 g | Freq: Once | INTRAVENOUS | Status: AC
  Administered 2024-01-20: 1 g via INTRAVENOUS
  Filled 2024-01-20: qty 10

## 2024-01-20 NOTE — ED Notes (Signed)
 Patient verbalized understanding of discharge orders, daughter also verbalized understanding, patient does not have any concerns at this time, patient has been wheeled out to ED lobby by the daughter.

## 2024-01-20 NOTE — ED Triage Notes (Signed)
 BIB [facility] from home, for generalized abdominal pain, N,V,D that started last night. Patient stated she had a meal from [employer] last night around 1700 and have not been feeling good ever since.

## 2024-01-20 NOTE — ED Provider Notes (Signed)
 [place] EMERGENCY DEPARTMENT AT [facility] Provider Note  CSN: [id number] Arrival date & time: 01/20/24 0907  Chief Complaint(s) Nausea and Abdominal Pain  HPI Jennifer Patel is a 89 y.o. female with past medical history as below, significant for hld htn renal insuff who presents to the ED with complaint of n/v/d  Pt reports she had [employer] chicken sandwich last night around 4pm, began to have n/v/d/abd cramping since then. Emesis nb/nb, no melena or brbpr. No fevers. She has diffuse abd cramping. No recent medication changes, travel, antibiotics, or sick contacts. No change w/ urination. EMS gave zofran pta w/ improvement to symptoms   Past Medical History Past Medical History:  Diagnosis Date   High cholesterol    Hypertension    Renal insufficiency    Thyroid disease    Patient Active Problem List   Diagnosis Date Noted   Pain due to onychomycosis of toenails of both feet 01/25/2021   Callus 01/25/2021   Anemia 03/12/2020   Femur fracture, right (HCC) 03/11/2020   Closed bicondylar fracture of distal end of right femur (HCC)    Resistant hypertension 11/13/2015   AKI (acute kidney injury) (HCC) 05/22/2015   Abnormal EKG 05/22/2015   Hypertension 05/22/2015   Hypothyroidism 05/22/2015   Hyperlipidemia 05/22/2015   Neck pain 05/22/2015   Home Medication(s) Prior to Admission medications   Medication Sig Start Date End Date Taking? Authorizing Provider  cephALEXin (KEFLEX) 500 MG capsule Take 1 capsule (500 mg total) by mouth 3 (three) times daily for 7 days. 01/20/24 01/27/24 Yes Sloan Leiter, DO  loperamide (IMODIUM) 2 MG capsule Take 1 capsule (2 mg total) by mouth 4 (four) times daily as needed for diarrhea or loose stools. 01/20/24  Yes Tanda Rockers A, DO  ondansetron (ZOFRAN) 4 MG tablet Take 1 tablet (4 mg total) by mouth every 4 (four) hours as needed for nausea or vomiting. 01/20/24  Yes Tanda Rockers A, DO  acetaminophen (TYLENOL) 500 MG tablet Take 1  tablet (500 mg total) by mouth every 6 (six) hours as needed (pain). 03/31/20   Edmon Crape C, PA-C  allopurinol (ZYLOPRIM) 100 MG tablet Take 2 tablets (200 mg total) by mouth daily. 03/31/20   Edmon Crape C, PA-C  atorvastatin (LIPITOR) 10 MG tablet Take 1 tablet (10 mg total) by mouth daily. Reported on 11/11/2015 03/31/20   Roena Malady, PA-C  benzonatate (TESSALON) 100 MG capsule Take 1 capsule (100 mg total) by mouth every 8 (eight) hours. 10/19/23   Arletha Pili, DO  Cyanocobalamin (VITAMIN B-12 IJ) Inject as directed every 30 (thirty) days. Next injection due 05/24/15 at Dr. Marylen Ponto office    [provider]  enoxaparin (LOVENOX) 30 MG/0.3ML injection Inject 0.3 mLs (30 mg total) into the skin daily. 03/15/20 04/14/20  [place], PA-C  furosemide (LASIX) 20 MG tablet Take 1 tablet (20 mg total) by mouth every Monday, Wednesday, and Friday. 03/31/20   Edmon Crape C, PA-C  hydrALAZINE (APRESOLINE) 25 MG tablet Take 1 tablet (25 mg total) by mouth 3 (three) times daily. 03/31/20   Roena Malady, PA-C  HYDROcodone-acetaminophen (NORCO/VICODIN) 5-325 MG tablet Take 1 tablet by mouth every 6 (six) hours as needed for severe pain. 03/14/20   [place], PA-C  lansoprazole (PREVACID) 15 MG capsule Take 1 capsule (15 mg total) by mouth daily at 12 noon. 03/31/20   Roena Malady, PA-C  levothyroxine (SYNTHROID) 75 MCG tablet Take 1 tablet (75 mcg  total) by mouth daily. 03/31/20   Edmon Crape C, PA-C  Nebivolol HCl (BYSTOLIC) 20 MG TABS Take 1 tablet (20 mg total) by mouth daily. 03/31/20   Edmon Crape C, PA-C  Polyethyl Glycol-Propyl Glycol (SYSTANE OP) Place 1 drop into both eyes 2 (two) times daily as needed (dry eyes).    [provider]  spironolactone (ALDACTONE) 25 MG tablet Take 0.5 tablets (12.5 mg total) by mouth daily. 03/31/20   Roena Malady, PA-C                                                                                                                                     Past Surgical History Past Surgical History:  Procedure Laterality Date   ABDOMINAL HYSTERECTOMY     CHOLECYSTECTOMY     ORIF FEMUR FRACTURE Right 03/12/2020   Procedure: OPEN REDUCTION INTERNAL FIXATION (ORIF) DISTAL FEMUR FRACTURE;  Surgeon: Roby Lofts, MD;  Location: [facility] OR;  Service: Orthopedics;  Laterality: Right;   PERIPHERAL VASCULAR CATHETERIZATION N/A 11/16/2015   Procedure: Renal Angiography;  Surgeon: Yates Decamp, MD;  Location: [facility] INVASIVE CV LAB;  Service: Cardiovascular;  Laterality: N/A;   Family History History reviewed. No pertinent family history.  Social History Social History   Tobacco Use   Smoking status: Former   Smokeless tobacco: Never  Substance Use Topics   Alcohol use: No    Alcohol/week: 0.0 standard drinks of alcohol   Drug use: No   Allergies Clarithromycin and Prednisone  Review of Systems A thorough review of systems was obtained and all systems are negative except as noted in the HPI and PMH.   Physical Exam Vital Signs  I have reviewed the triage vital signs BP (!) 142/69   Pulse 72   Temp 98.7 F (37.1 C) (Oral)   Resp 18   Ht 4\' 11"  (1.499 m)   Wt 43.5 kg   SpO2 100%   BMI 19.39 kg/m  Physical Exam Vitals and nursing note reviewed.  Constitutional:      General: She is not in acute distress.    Appearance: Normal appearance. She is well-developed. She is not ill-appearing.  HENT:     Head: Normocephalic and atraumatic.     Right Ear: External ear normal.     Left Ear: External ear normal.     Nose: Nose normal.     Mouth/Throat:     Mouth: Mucous membranes are dry.  Eyes:     General: No scleral icterus.       Right eye: No discharge.        Left eye: No discharge.  Cardiovascular:     Rate and Rhythm: Normal rate.  Pulmonary:     Effort: Pulmonary effort is normal. No respiratory distress.     Breath sounds: No stridor.  Abdominal:     General: Abdomen is flat. There is no distension.  Palpations:  Abdomen is soft.     Tenderness: There is generalized abdominal tenderness. There is no guarding or rebound. Negative signs include Murphy's sign.  Musculoskeletal:        General: No deformity.     Cervical back: No rigidity.  Skin:    General: Skin is warm and dry.     Coloration: Skin is not cyanotic, jaundiced or pale.  Neurological:     Mental Status: She is alert and oriented to person, place, and time.     GCS: GCS eye subscore is 4. GCS verbal subscore is 5. GCS motor subscore is 6.  Psychiatric:        Speech: Speech normal.        Behavior: Behavior normal. Behavior is cooperative.     ED Results and Treatments Labs (all labs ordered are listed, but only abnormal results are displayed) Labs Reviewed  CBC WITH DIFFERENTIAL/PLATELET - Abnormal; Notable for the following components:      Result Value   RBC 3.28 (*)    Hemoglobin 9.9 (*)    HCT 31.6 (*)    Neutro Abs 9.7 (*)    Lymphs Abs 0.2 (*)    All other components within normal limits  URINALYSIS, ROUTINE W REFLEX MICROSCOPIC - Abnormal; Notable for the following components:   Leukocytes,Ua MODERATE (*)    Bacteria, UA RARE (*)    All other components within normal limits  COMPREHENSIVE METABOLIC PANEL WITH GFR - Abnormal; Notable for the following components:   Chloride 112 (*)    CO2 19 (*)    Glucose, Bld 128 (*)    BUN 34 (*)    Creatinine, Ser 1.33 (*)    Calcium 8.1 (*)    Total Protein 6.1 (*)    Albumin 3.1 (*)    GFR, Estimated 38 (*)    All other components within normal limits  URINE CULTURE  LIPASE, BLOOD                                                                                                                          Radiology CT ABDOMEN PELVIS W CONTRAST Result Date: 01/20/2024 CLINICAL DATA:  Acute abdominal pain.  Nausea vomiting and diarrhea. EXAM: CT ABDOMEN AND PELVIS WITH CONTRAST TECHNIQUE: Multidetector CT imaging of the abdomen and pelvis was performed using the standard  protocol following bolus administration of intravenous contrast. RADIATION DOSE REDUCTION: This exam was performed according to the departmental dose-optimization program which includes automated exposure control, adjustment of the mA and/or kV according to patient size and/or use of iterative reconstruction technique. CONTRAST:  55mL OMNIPAQUE IOHEXOL 350 MG/ML SOLN COMPARISON:  12/06/2021 FINDINGS: Lower chest: No pleural fluid or airspace consolidation. There is a large hiatal hernia which contains approximately 1/3 fluid-filled intrathoracic stomach. Hepatobiliary: No focal liver abnormality identified. Status post cholecystectomy. Common bile duct measures 1 cm in diameter. This is similar to the exam from 12/06/2021. No calcified common bile duct stones identified.  Pancreas: Unremarkable. No pancreatic ductal dilatation or surrounding inflammatory changes. Spleen: Normal in size without focal abnormality. Adrenals/Urinary Tract: Normal adrenal glands. No nephrolithiasis or obstructive uropathy. Mild, bilateral renal cortical thinning is identified. Bosniak class 2 cyst is identified within the lower pole of right kidney measuring 1 cm, image 25/2. No follow-up imaging recommended. Urinary bladder appears normal. Stomach/Bowel: Distended fluid-filled stomach is noted. The proximal mid small bowel loops are mildly increased in caliber measuring up to 2.6 cm. Gradual decrease caliber of the small bowel loops noted up to the terminal ileum. 8 mm fluid-filled appendix is identified extending into the right inguinal canal, image 44/5 and image 66/3. No periappendiceal soft tissue stranding or free fluid. Liquid stool identified throughout the colon. Colonic diverticulosis identified without signs of acute diverticulitis. Vascular/Lymphatic: Aortic atherosclerosis, no aneurysm. Severe atherosclerotic disease involves the celiac and superior mesenteric artery with multifocal areas of high-grade stenosis involving the  SMA without signs of occlusion. No signs of abdominopelvic adenopathy. Reproductive: Uterus appears surgically absent. No adnexal mass noted. Other: No free fluid or fluid collections identified. No signs of pneumoperitoneum. Musculoskeletal: No acute or suspicious osseous findings. 4 mm retrolisthesis of L1 on L2. IMPRESSION: 1. Liquid stool identified throughout the colon compatible with diarrheal illness. 2. No pathologic dilatation of the large or small bowel loops to suggest bowel obstruction. Mild increase caliber of the proximal small bowel loops with gradual decrease caliber to the level of the terminal ileum may reflect sequelae of enteritis. 3. Large hiatal hernia which contains approximately 1/3 fluid-filled intrathoracic stomach. 4. Severe mesenteric atherosclerotic disease identified with multiple areas of high-grade stenosis involving the superior mesenteric artery without signs of occlusion. 5. Colonic diverticulosis without signs of acute diverticulitis. 6. 8 mm fluid-filled appendix is identified extending into the right inguinal canal. No periappendiceal soft tissue stranding or free fluid. 7.  Aortic Atherosclerosis (ICD10-I70.0). Electronically Signed   By: Signa Kell M.D.   On: 01/20/2024 13:02    Pertinent labs & imaging results that were available during my care of the patient were reviewed by me and considered in my medical decision making (see MDM for details).  Medications Ordered in ED Medications  cefTRIAXone (ROCEPHIN) 1 g in sodium chloride 0.9 % 100 mL IVPB (1 g Intravenous New Bag/Given 01/20/24 1411)  sodium chloride 0.9 % bolus 1,000 mL (0 mLs Intravenous Stopped 01/20/24 1109)  famotidine (PEPCID) IVPB 20 mg premix (0 mg Intravenous Stopped 01/20/24 1038)  alum & mag hydroxide-simeth (MAALOX/MYLANTA) 200-200-20 MG/5ML suspension 15 mL (15 mLs Oral Given 01/20/24 1005)  iohexol (OMNIPAQUE) 350 MG/ML injection 55 mL (55 mLs Intravenous Contrast Given 01/20/24 1241)                                                                                                                                      Procedures Procedures  (including critical care time)  Medical Decision Making / ED Course  Medical Decision Making:    SHARDAY MICHL is a 89 y.o. female with past medical history as below, significant for hld htn renal insuff who presents to the ED with complaint of n/v/d. The complaint involves an extensive differential diagnosis and also carries with it a high risk of complications and morbidity.  Serious etiology was considered. Ddx includes but is not limited to: Enteritis, gastroenteritis, foodborne illness, pancreatitis, gastritis, bowel obstruction, UTI, viral syndrome, etc.  Complete initial physical exam performed, notably the patient was in no distress, sitting comfortably on stretcher.    Reviewed and confirmed nursing documentation for past medical history, family history, social history.  Vital signs reviewed.    Clinical Course as of 01/20/24 1428  Sun Jan 20, 2024  1007 Hemoglobin(!): 9.9 Baseline around 10-11, does not report and bleeding [SG]  1132 Creatinine(!): 1.33 Similar to prior  [SG]  1134 BUN(!): 34 [SG]  1344 Feeling better, able to po w/o difficulty  [SG]  1426 CO2(!): 19 Similar to prev  [SG]  1426 BUN(!): 34 Mild elev, likely 2/2 volume loss; give ivf here and tolerating po [SG]    Clinical Course User Index [SG] Sloan Leiter, DO    Brief summary: 89 year old female history as above here with nausea vomiting diarrhea abdominal cramping after eating chicken sandwich.  Emesis nonbloody nonbilious, diarrhea is nonbloody nonmelanotic. Better after Zofran.  Will give fluids, GI cocktail.  Check screening labs and reassess  Possible UTI, given Rocephin here, Keflex for home.  Send urine culture  CT imaging reviewed, chronic findings noted, also concern for possible enteritis.  She has dilated appendix but no evidence of  appendicitis per radiology.  On recheck she is feeling much better, symptoms fair to be secondary to gastroenteritis or possible foodborne illness, she received IV fluids, she is tolerant p.o. without difficulty.  She is HDS.  Reasonable to trial outpatient management with symptomatic treatment at home, recommend follow-up with her PCP this week.  If diarrhea persist may need to give stool sample.  The patient improved significantly and was discharged in stable condition. Detailed discussions were had with the patient/guardian regarding current findings, and need for close f/u with PCP or on call doctor. The patient/guardian has been instructed to return immediately if the symptoms worsen in any way for re-evaluation. Patient/guardian verbalized understanding and is in agreement with current care plan. All questions answered prior to discharge.             Additional history obtained: -Additional history obtained from na -External records from outside source obtained and reviewed including: Chart review including previous notes, labs, imaging, consultation notes including  Prior ED evaluation, primary care documentation, home medications, prior labs   Lab Tests: -I ordered, reviewed, and interpreted labs.   The pertinent results include:   Labs Reviewed  CBC WITH DIFFERENTIAL/PLATELET - Abnormal; Notable for the following components:      Result Value   RBC 3.28 (*)    Hemoglobin 9.9 (*)    HCT 31.6 (*)    Neutro Abs 9.7 (*)    Lymphs Abs 0.2 (*)    All other components within normal limits  URINALYSIS, ROUTINE W REFLEX MICROSCOPIC - Abnormal; Notable for the following components:   Leukocytes,Ua MODERATE (*)    Bacteria, UA RARE (*)    All other components within normal limits  COMPREHENSIVE METABOLIC PANEL WITH GFR - Abnormal; Notable for the following components:   Chloride 112 (*)    CO2 19 (*)  Glucose, Bld 128 (*)    BUN 34 (*)    Creatinine, Ser 1.33 (*)     Calcium 8.1 (*)    Total Protein 6.1 (*)    Albumin 3.1 (*)    GFR, Estimated 38 (*)    All other components within normal limits  URINE CULTURE  LIPASE, BLOOD    Notable for as above  EKG   EKG Interpretation Date/Time:    Ventricular Rate:    PR Interval:    QRS Duration:    QT Interval:    QTC Calculation:   R Axis:      Text Interpretation:           Imaging Studies ordered: I ordered imaging studies including ctap I independently visualized the following imaging with scope of interpretation limited to determining acute life threatening conditions related to emergency care; findings noted above I independently visualized and interpreted imaging. I agree with the radiologist interpretation   Medicines ordered and prescription drug management: Meds ordered this encounter  Medications   sodium chloride 0.9 % bolus 1,000 mL   famotidine (PEPCID) IVPB 20 mg premix   alum & mag hydroxide-simeth (MAALOX/MYLANTA) 200-200-20 MG/5ML suspension 15 mL   iohexol (OMNIPAQUE) 350 MG/ML injection 55 mL   cefTRIAXone (ROCEPHIN) 1 g in sodium chloride 0.9 % 100 mL IVPB    Antibiotic Indication::   UTI   loperamide (IMODIUM) 2 MG capsule    Sig: Take 1 capsule (2 mg total) by mouth 4 (four) times daily as needed for diarrhea or loose stools.    Dispense:  12 capsule    Refill:  0   cephALEXin (KEFLEX) 500 MG capsule    Sig: Take 1 capsule (500 mg total) by mouth 3 (three) times daily for 7 days.    Dispense:  21 capsule    Refill:  0   ondansetron (ZOFRAN) 4 MG tablet    Sig: Take 1 tablet (4 mg total) by mouth every 4 (four) hours as needed for nausea or vomiting.    Dispense:  12 tablet    Refill:  0    -I have reviewed the patients home medicines and have made adjustments as needed   Consultations Obtained: na   Cardiac Monitoring: Continuous pulse oximetry interpreted by myself, 98% on RA.    Social Determinants of Health:  Diagnosis or treatment significantly  limited by social determinants of health: former smoker   Reevaluation: After the interventions noted above, I reevaluated the patient and found that they have improved  Co morbidities that complicate the patient evaluation  Past Medical History:  Diagnosis Date   High cholesterol    Hypertension    Renal insufficiency    Thyroid disease       Dispostion: Disposition decision including need for hospitalization was considered, and patient discharged from emergency department.    Final Clinical Impression(s) / ED Diagnoses Final diagnoses:  Gastroenteritis  Acute cystitis without hematuria        Sloan Leiter, DO 01/20/24 1428

## 2024-01-20 NOTE — Discharge Instructions (Addendum)
 You should return to the hospital if you experience return of persistent nausea and vomiting that does not resolve and does not allow you to tolerate any food or fluids, persistent fevers for greater than 2-3 more days, increasing abdominal pain that persists despite medications, persistent diarrhea, dizziness, syncope (fainting), or for any other concerns.    Please return to the emergency department immediately for any new or concerning symptoms, or if you get worse.

## 2024-01-20 NOTE — ED Notes (Signed)
 Patient given peanut butter and crackers with gingerale to eat. Patient currently tolerating well.

## 2024-01-22 LAB — URINE CULTURE: Culture: 50000 — AB

## 2024-01-23 ENCOUNTER — Telehealth (HOSPITAL_BASED_OUTPATIENT_CLINIC_OR_DEPARTMENT_OTHER): Payer: Self-pay | Admitting: *Deleted

## 2024-01-23 NOTE — Telephone Encounter (Signed)
 Post ED Visit - Positive Culture Follow-up  Culture report reviewed by antimicrobial stewardship pharmacist: [facility] [x]  Ivery Quale Pharm.D. []  Celedonio Miyamoto, Pharm.D., BCPS AQ-ID []  Garvin Fila, Pharm.D., BCPS []  Georgina Pillion, [address].D., BCPS []  [place], [address].D., BCPS, AAHIVP []  Estella Husk, Pharm.D., BCPS, AAHIVP []  Lysle Svea, PharmD, BCPS []  Phillips Climes, PharmD, BCPS []  Agapito Games, PharmD, BCPS []  Verlan Friends, PharmD []  Mervyn Gay, PharmD, BCPS []  Vinnie Level, PharmD  [facility] []  Len Childs, PharmD []  Greer Pickerel, PharmD []  Adalberto Cole, PharmD []  Perlie Gold, Rph []  Lonell Face) Jean Rosenthal, PharmD []  Earl Many, PharmD []  Junita Push, PharmD []  Dorna Leitz, PharmD []  Terrilee Files, PharmD []  Lynann Beaver, PharmD []  Keturah Barre, PharmD []  Loralee Pacas, PharmD []  Bernadene Person, PharmD   Positive urine culture Treated with cephalexin, organism sensitive to the same and no further patient follow-up is required at this time.  [name] 01/23/2024, 8:06 AM

## 2024-01-25 ENCOUNTER — Telehealth: Payer: Self-pay

## 2024-01-25 NOTE — Telephone Encounter (Signed)
 This RNCM received inbound call from patient who reports she continues to have diarrhea and wants the MD to give her some medication. PeR chart review patient was prescribed lopermide 2mg  capsule on 01/20/24. Patient reports she took her last one and has called her PCP who has called her medications into [facility] on [address]. This RNCM advised the EDP will not prescribe any additional medications as she will need to continue to follow up with her PCP.   No additional TOC needs

## 2024-03-11 ENCOUNTER — Ambulatory Visit: Payer: PPO | Admitting: Podiatry

## 2024-03-12 ENCOUNTER — Ambulatory Visit: Admitting: Podiatry

## 2024-03-14 ENCOUNTER — Ambulatory Visit: Admitting: Podiatry

## 2024-03-14 ENCOUNTER — Encounter: Payer: Self-pay | Admitting: Podiatry

## 2024-03-14 DIAGNOSIS — L84 Corns and callosities: Secondary | ICD-10-CM

## 2024-03-14 DIAGNOSIS — M79675 Pain in left toe(s): Secondary | ICD-10-CM | POA: Diagnosis not present

## 2024-03-14 DIAGNOSIS — B351 Tinea unguium: Secondary | ICD-10-CM | POA: Diagnosis not present

## 2024-03-14 DIAGNOSIS — M79674 Pain in right toe(s): Secondary | ICD-10-CM

## 2024-03-14 NOTE — Progress Notes (Signed)
This patient returns to my office for at risk foot care.  This patient requires this care by a professional since this patient will be at risk due to having AKI.    This patient is unable to trim callus  herself since the patient cannot reach her feet.These callus  are painful walking and wearing shoes.  This patient presents for at risk foot care today.  General Appearance  Alert, conversant and in no acute stress.  Vascular  Dorsalis pedis and posterior tibial  pulses are palpable  bilaterally.  Capillary return is within normal limits  bilaterally. Temperature is within normal limits  bilaterally.  Neurologic  Senn-Weinstein monofilament wire test within normal limits  bilaterally. Muscle power within normal limits bilaterally.  Nails Thick disfigured discolored nails with subungual debris  from hallux to fifth toes bilaterally. No evidence of bacterial infection or drainage bilaterally.  Orthopedic  No limitations of motion  feet .  No crepitus or effusions noted.  No bony pathology or digital deformities noted.  Plantar flexed fifth met  B/L  Hammer toes 2-4  B/L.  Skin  normotropic skin with  noted bilaterally.  No signs of infections or ulcers noted.  Callus noted sub 1  Left foot .and sub 5 right.  Callus  B/L.  Onychomycosis    Consent was obtained for treatment procedures..Debridement of callus with # 15 blade and then  filed with dremel without incident.  Debride nails with nail nipper and dremel tool.   Return office visit   4   months                  Told patient to return for periodic foot care and evaluation due to potential at risk complications.   Helane Gunther DPM

## 2024-05-22 DIAGNOSIS — Z862 Personal history of diseases of the blood and blood-forming organs and certain disorders involving the immune mechanism: Secondary | ICD-10-CM | POA: Diagnosis not present

## 2024-05-22 DIAGNOSIS — Z0000 Encounter for general adult medical examination without abnormal findings: Secondary | ICD-10-CM | POA: Diagnosis not present

## 2024-05-22 DIAGNOSIS — I129 Hypertensive chronic kidney disease with stage 1 through stage 4 chronic kidney disease, or unspecified chronic kidney disease: Secondary | ICD-10-CM | POA: Diagnosis not present

## 2024-05-22 DIAGNOSIS — R7309 Other abnormal glucose: Secondary | ICD-10-CM | POA: Diagnosis not present

## 2024-05-22 DIAGNOSIS — E559 Vitamin D deficiency, unspecified: Secondary | ICD-10-CM | POA: Diagnosis not present

## 2024-05-22 DIAGNOSIS — N1832 Chronic kidney disease, stage 3b: Secondary | ICD-10-CM | POA: Diagnosis not present

## 2024-05-22 DIAGNOSIS — E039 Hypothyroidism, unspecified: Secondary | ICD-10-CM | POA: Diagnosis not present

## 2024-05-22 DIAGNOSIS — E790 Hyperuricemia without signs of inflammatory arthritis and tophaceous disease: Secondary | ICD-10-CM | POA: Diagnosis not present

## 2024-05-22 DIAGNOSIS — E538 Deficiency of other specified B group vitamins: Secondary | ICD-10-CM | POA: Diagnosis not present

## 2024-05-22 DIAGNOSIS — G3184 Mild cognitive impairment, so stated: Secondary | ICD-10-CM | POA: Diagnosis not present

## 2024-05-22 DIAGNOSIS — E7800 Pure hypercholesterolemia, unspecified: Secondary | ICD-10-CM | POA: Diagnosis not present

## 2024-06-17 DIAGNOSIS — M1711 Unilateral primary osteoarthritis, right knee: Secondary | ICD-10-CM | POA: Diagnosis not present

## 2024-06-19 DIAGNOSIS — Z1231 Encounter for screening mammogram for malignant neoplasm of breast: Secondary | ICD-10-CM | POA: Diagnosis not present

## 2024-07-15 ENCOUNTER — Encounter: Payer: Self-pay | Admitting: Podiatry

## 2024-07-15 ENCOUNTER — Ambulatory Visit: Admitting: Podiatry

## 2024-07-15 DIAGNOSIS — B351 Tinea unguium: Secondary | ICD-10-CM

## 2024-07-15 DIAGNOSIS — M79675 Pain in left toe(s): Secondary | ICD-10-CM | POA: Diagnosis not present

## 2024-07-15 DIAGNOSIS — L84 Corns and callosities: Secondary | ICD-10-CM | POA: Diagnosis not present

## 2024-07-15 DIAGNOSIS — M79674 Pain in right toe(s): Secondary | ICD-10-CM

## 2024-07-15 NOTE — Progress Notes (Signed)
 This patient returns to my office for at risk foot care.  This patient requires this care by a professional since this patient will be at risk due to having AKI.    This patient is unable to trim callus  herself since the patient cannot reach her feet.These callus  are painful walking and wearing shoes.  This patient presents for at risk foot care today.  General Appearance  Alert, conversant and in no acute stress.  Vascular  Dorsalis pedis and posterior tibial  pulses are palpable  bilaterally.  Capillary return is within normal limits  bilaterally. Temperature is within normal limits  bilaterally.  Neurologic  Senn-Weinstein monofilament wire test within normal limits  bilaterally. Muscle power within normal limits bilaterally.  Nails Thick disfigured discolored nails with subungual debris  from hallux to fifth toes bilaterally. No evidence of bacterial infection or drainage bilaterally.  Orthopedic  No limitations of motion  feet .  No crepitus or effusions noted.  No bony pathology or digital deformities noted.  Plantar flexed fifth met  B/L  Hammer toes 2-4  B/L.  Skin  normotropic skin with  noted bilaterally.  No signs of infections or ulcers noted.  Callus noted sub 1  Left foot .and sub 5 B/L.  Callus  B/L.  Onychomycosis    Consent was obtained for treatment procedures..Debridement of callus with # 15 blade and then  filed with dremel without incident.  Debride nails with nail nipper and dremel tool.   Return office visit   3  months                  Told patient to return for periodic foot care and evaluation due to potential at risk complications.   Cordella Bold DPM

## 2024-10-14 ENCOUNTER — Ambulatory Visit: Admitting: Podiatry

## 2024-11-04 ENCOUNTER — Ambulatory Visit: Admitting: Podiatry

## 2024-11-04 ENCOUNTER — Encounter: Payer: Self-pay | Admitting: Podiatry

## 2024-11-04 DIAGNOSIS — M79674 Pain in right toe(s): Secondary | ICD-10-CM | POA: Diagnosis not present

## 2024-11-04 DIAGNOSIS — B351 Tinea unguium: Secondary | ICD-10-CM | POA: Diagnosis not present

## 2024-11-04 DIAGNOSIS — M79675 Pain in left toe(s): Secondary | ICD-10-CM

## 2024-11-04 DIAGNOSIS — L84 Corns and callosities: Secondary | ICD-10-CM

## 2024-11-04 DIAGNOSIS — N179 Acute kidney failure, unspecified: Secondary | ICD-10-CM | POA: Diagnosis not present

## 2024-11-04 NOTE — Progress Notes (Signed)
 This patient returns to my office for at risk foot care.  This patient requires this care by a professional since this patient will be at risk due to having AKI.    This patient is unable to trim callus  herself since the patient cannot reach her feet.These callus  are painful walking and wearing shoes.  This patient presents for at risk foot care today.  General Appearance  Alert, conversant and in no acute stress.  Vascular  Dorsalis pedis and posterior tibial  pulses are palpable  bilaterally.  Capillary return is within normal limits  bilaterally. Temperature is within normal limits  bilaterally.  Neurologic  Senn-Weinstein monofilament wire test within normal limits  bilaterally. Muscle power within normal limits bilaterally.  Nails Thick disfigured discolored nails with subungual debris  from hallux to fifth toes bilaterally. No evidence of bacterial infection or drainage bilaterally.  Orthopedic  No limitations of motion  feet .  No crepitus or effusions noted.  No bony pathology or digital deformities noted.  Plantar flexed fifth met  B/L  Hammer toes 2-4  B/L.  Skin  normotropic skin with  noted bilaterally.  No signs of infections or ulcers noted.  Callus noted sub 1  Left foot .and sub 5 B/L.  Callus  B/L.  Onychomycosis    Consent was obtained for treatment procedures..Debridement of callus with # 15 blade and then  filed with dremel without incident.  Debride nails with nail nipper and dremel tool.   Return office visit   3  months                  Told patient to return for periodic foot care and evaluation due to potential at risk complications.   Cordella Bold DPM

## 2024-11-19 ENCOUNTER — Inpatient Hospital Stay (HOSPITAL_COMMUNITY)

## 2024-11-19 ENCOUNTER — Inpatient Hospital Stay (HOSPITAL_COMMUNITY)
Admission: EM | Admit: 2024-11-19 | Discharge: 2024-11-23 | DRG: 291 | Disposition: A | Attending: Internal Medicine | Admitting: Internal Medicine

## 2024-11-19 ENCOUNTER — Other Ambulatory Visit: Payer: Self-pay

## 2024-11-19 ENCOUNTER — Emergency Department (HOSPITAL_COMMUNITY)

## 2024-11-19 ENCOUNTER — Encounter (HOSPITAL_COMMUNITY): Payer: Self-pay | Admitting: Internal Medicine

## 2024-11-19 DIAGNOSIS — Z79899 Other long term (current) drug therapy: Secondary | ICD-10-CM

## 2024-11-19 DIAGNOSIS — I509 Heart failure, unspecified: Secondary | ICD-10-CM

## 2024-11-19 DIAGNOSIS — Z881 Allergy status to other antibiotic agents status: Secondary | ICD-10-CM

## 2024-11-19 DIAGNOSIS — M419 Scoliosis, unspecified: Secondary | ICD-10-CM | POA: Diagnosis present

## 2024-11-19 DIAGNOSIS — Z90710 Acquired absence of both cervix and uterus: Secondary | ICD-10-CM

## 2024-11-19 DIAGNOSIS — E7800 Pure hypercholesterolemia, unspecified: Secondary | ICD-10-CM | POA: Diagnosis present

## 2024-11-19 DIAGNOSIS — T502X5A Adverse effect of carbonic-anhydrase inhibitors, benzothiadiazides and other diuretics, initial encounter: Secondary | ICD-10-CM | POA: Diagnosis not present

## 2024-11-19 DIAGNOSIS — Z1152 Encounter for screening for COVID-19: Secondary | ICD-10-CM

## 2024-11-19 DIAGNOSIS — Z8249 Family history of ischemic heart disease and other diseases of the circulatory system: Secondary | ICD-10-CM

## 2024-11-19 DIAGNOSIS — Z79890 Hormone replacement therapy (postmenopausal): Secondary | ICD-10-CM

## 2024-11-19 DIAGNOSIS — F05 Delirium due to known physiological condition: Secondary | ICD-10-CM | POA: Diagnosis present

## 2024-11-19 DIAGNOSIS — J9601 Acute respiratory failure with hypoxia: Secondary | ICD-10-CM | POA: Diagnosis present

## 2024-11-19 DIAGNOSIS — I959 Hypotension, unspecified: Secondary | ICD-10-CM | POA: Diagnosis not present

## 2024-11-19 DIAGNOSIS — Z91148 Patient's other noncompliance with medication regimen for other reason: Secondary | ICD-10-CM

## 2024-11-19 DIAGNOSIS — Z9049 Acquired absence of other specified parts of digestive tract: Secondary | ICD-10-CM

## 2024-11-19 DIAGNOSIS — K219 Gastro-esophageal reflux disease without esophagitis: Secondary | ICD-10-CM | POA: Diagnosis present

## 2024-11-19 DIAGNOSIS — N179 Acute kidney failure, unspecified: Secondary | ICD-10-CM | POA: Diagnosis not present

## 2024-11-19 DIAGNOSIS — N1832 Chronic kidney disease, stage 3b: Secondary | ICD-10-CM | POA: Diagnosis present

## 2024-11-19 DIAGNOSIS — R0600 Dyspnea, unspecified: Principal | ICD-10-CM

## 2024-11-19 DIAGNOSIS — E039 Hypothyroidism, unspecified: Secondary | ICD-10-CM | POA: Diagnosis present

## 2024-11-19 DIAGNOSIS — R5381 Other malaise: Secondary | ICD-10-CM | POA: Diagnosis present

## 2024-11-19 DIAGNOSIS — J189 Pneumonia, unspecified organism: Secondary | ICD-10-CM

## 2024-11-19 DIAGNOSIS — I130 Hypertensive heart and chronic kidney disease with heart failure and stage 1 through stage 4 chronic kidney disease, or unspecified chronic kidney disease: Principal | ICD-10-CM | POA: Diagnosis present

## 2024-11-19 DIAGNOSIS — Z888 Allergy status to other drugs, medicaments and biological substances status: Secondary | ICD-10-CM

## 2024-11-19 DIAGNOSIS — I1A0 Resistant hypertension: Secondary | ICD-10-CM | POA: Diagnosis present

## 2024-11-19 DIAGNOSIS — Z87891 Personal history of nicotine dependence: Secondary | ICD-10-CM

## 2024-11-19 DIAGNOSIS — I5033 Acute on chronic diastolic (congestive) heart failure: Secondary | ICD-10-CM | POA: Diagnosis present

## 2024-11-19 DIAGNOSIS — E785 Hyperlipidemia, unspecified: Secondary | ICD-10-CM | POA: Diagnosis present

## 2024-11-19 DIAGNOSIS — I10 Essential (primary) hypertension: Secondary | ICD-10-CM | POA: Diagnosis present

## 2024-11-19 LAB — BASIC METABOLIC PANEL WITH GFR
Anion gap: 14 (ref 5–15)
BUN: 23 mg/dL (ref 8–23)
CO2: 23 mmol/L (ref 22–32)
Calcium: 9.3 mg/dL (ref 8.9–10.3)
Chloride: 102 mmol/L (ref 98–111)
Creatinine, Ser: 1.3 mg/dL — ABNORMAL HIGH (ref 0.44–1.00)
GFR, Estimated: 39 mL/min — ABNORMAL LOW
Glucose, Bld: 131 mg/dL — ABNORMAL HIGH (ref 70–99)
Potassium: 4.1 mmol/L (ref 3.5–5.1)
Sodium: 138 mmol/L (ref 135–145)

## 2024-11-19 LAB — CBC WITH DIFFERENTIAL/PLATELET
Abs Immature Granulocytes: 0.04 10*3/uL (ref 0.00–0.07)
Basophils Absolute: 0 10*3/uL (ref 0.0–0.1)
Basophils Relative: 1 %
Eosinophils Absolute: 0.1 10*3/uL (ref 0.0–0.5)
Eosinophils Relative: 2 %
HCT: 32.5 % — ABNORMAL LOW (ref 36.0–46.0)
Hemoglobin: 10.6 g/dL — ABNORMAL LOW (ref 12.0–15.0)
Immature Granulocytes: 1 %
Lymphocytes Relative: 11 %
Lymphs Abs: 0.8 10*3/uL (ref 0.7–4.0)
MCH: 30.9 pg (ref 26.0–34.0)
MCHC: 32.6 g/dL (ref 30.0–36.0)
MCV: 94.8 fL (ref 80.0–100.0)
Monocytes Absolute: 0.3 10*3/uL (ref 0.1–1.0)
Monocytes Relative: 5 %
Neutro Abs: 5.8 10*3/uL (ref 1.7–7.7)
Neutrophils Relative %: 80 %
Platelets: 340 10*3/uL (ref 150–400)
RBC: 3.43 MIL/uL — ABNORMAL LOW (ref 3.87–5.11)
RDW: 13.2 % (ref 11.5–15.5)
WBC: 7.1 10*3/uL (ref 4.0–10.5)
nRBC: 0 % (ref 0.0–0.2)

## 2024-11-19 LAB — TSH: TSH: 10.3 u[IU]/mL — ABNORMAL HIGH (ref 0.350–4.500)

## 2024-11-19 LAB — RESP PANEL BY RT-PCR (RSV, FLU A&B, COVID)  RVPGX2
Influenza A by PCR: NEGATIVE
Influenza B by PCR: NEGATIVE
Resp Syncytial Virus by PCR: NEGATIVE
SARS Coronavirus 2 by RT PCR: NEGATIVE

## 2024-11-19 LAB — URINALYSIS, ROUTINE W REFLEX MICROSCOPIC
Bilirubin Urine: NEGATIVE
Glucose, UA: NEGATIVE mg/dL
Hgb urine dipstick: NEGATIVE
Ketones, ur: NEGATIVE mg/dL
Leukocytes,Ua: NEGATIVE
Nitrite: NEGATIVE
Protein, ur: NEGATIVE mg/dL
Specific Gravity, Urine: 1.011 (ref 1.005–1.030)
pH: 5 (ref 5.0–8.0)

## 2024-11-19 LAB — PRO BRAIN NATRIURETIC PEPTIDE: Pro Brain Natriuretic Peptide: 20388 pg/mL — ABNORMAL HIGH

## 2024-11-19 MED ORDER — FUROSEMIDE 10 MG/ML IJ SOLN
40.0000 mg | Freq: Every day | INTRAMUSCULAR | Status: AC
  Administered 2024-11-20: 40 mg via INTRAVENOUS
  Filled 2024-11-19: qty 4

## 2024-11-19 MED ORDER — HYDRALAZINE HCL 20 MG/ML IJ SOLN
5.0000 mg | INTRAMUSCULAR | Status: DC | PRN
  Administered 2024-11-23: 5 mg via INTRAVENOUS
  Filled 2024-11-19: qty 1

## 2024-11-19 MED ORDER — SODIUM CHLORIDE 0.9 % IV SOLN
100.0000 mg | Freq: Two times a day (BID) | INTRAVENOUS | Status: DC
  Administered 2024-11-19: 100 mg via INTRAVENOUS
  Filled 2024-11-19 (×2): qty 100

## 2024-11-19 MED ORDER — LEVOTHYROXINE SODIUM 75 MCG PO TABS
75.0000 ug | ORAL_TABLET | Freq: Every day | ORAL | Status: DC
  Administered 2024-11-19: 75 ug via ORAL
  Filled 2024-11-19: qty 1

## 2024-11-19 MED ORDER — ENOXAPARIN SODIUM 30 MG/0.3ML IJ SOSY
30.0000 mg | PREFILLED_SYRINGE | Freq: Every day | INTRAMUSCULAR | Status: DC
  Administered 2024-11-19 – 2024-11-22 (×3): 30 mg via SUBCUTANEOUS
  Filled 2024-11-19 (×4): qty 0.3

## 2024-11-19 MED ORDER — ACETAMINOPHEN 325 MG PO TABS
650.0000 mg | ORAL_TABLET | Freq: Four times a day (QID) | ORAL | Status: DC | PRN
  Administered 2024-11-23: 650 mg via ORAL
  Filled 2024-11-19: qty 2

## 2024-11-19 MED ORDER — AMLODIPINE BESYLATE 5 MG PO TABS
5.0000 mg | ORAL_TABLET | Freq: Every day | ORAL | Status: DC
  Administered 2024-11-20: 5 mg via ORAL
  Filled 2024-11-19 (×2): qty 1

## 2024-11-19 MED ORDER — ONDANSETRON HCL 4 MG/2ML IJ SOLN: 4.0000 mg | Freq: Four times a day (QID) | INTRAMUSCULAR | Status: DC | PRN

## 2024-11-19 MED ORDER — PANTOPRAZOLE SODIUM 20 MG PO TBEC
20.0000 mg | DELAYED_RELEASE_TABLET | Freq: Every day | ORAL | Status: DC
  Administered 2024-11-20 – 2024-11-23 (×4): 20 mg via ORAL
  Filled 2024-11-19 (×4): qty 1

## 2024-11-19 MED ORDER — LEVOTHYROXINE SODIUM 50 MCG PO TABS
50.0000 ug | ORAL_TABLET | Freq: Every morning | ORAL | Status: DC
  Administered 2024-11-20 – 2024-11-21 (×2): 50 ug via ORAL
  Filled 2024-11-19 (×2): qty 1

## 2024-11-19 MED ORDER — SODIUM CHLORIDE 0.9 % IV SOLN
2.0000 g | Freq: Once | INTRAVENOUS | Status: AC
  Administered 2024-11-19: 2 g via INTRAVENOUS
  Filled 2024-11-19: qty 20

## 2024-11-19 MED ORDER — ACETAMINOPHEN 650 MG RE SUPP: 650.0000 mg | Freq: Four times a day (QID) | RECTAL | Status: DC | PRN

## 2024-11-19 MED ORDER — ATORVASTATIN CALCIUM 10 MG PO TABS
10.0000 mg | ORAL_TABLET | Freq: Every day | ORAL | Status: DC
  Administered 2024-11-19 – 2024-11-23 (×5): 10 mg via ORAL
  Filled 2024-11-19 (×5): qty 1

## 2024-11-19 MED ORDER — DOCUSATE SODIUM 100 MG PO CAPS
100.0000 mg | ORAL_CAPSULE | Freq: Two times a day (BID) | ORAL | Status: DC
  Administered 2024-11-19 – 2024-11-21 (×4): 100 mg via ORAL
  Filled 2024-11-19 (×5): qty 1

## 2024-11-19 MED ORDER — FUROSEMIDE 10 MG/ML IJ SOLN
40.0000 mg | Freq: Once | INTRAMUSCULAR | Status: AC
  Administered 2024-11-19: 40 mg via INTRAVENOUS
  Filled 2024-11-19: qty 4

## 2024-11-19 MED ORDER — HYDRALAZINE HCL 25 MG PO TABS
25.0000 mg | ORAL_TABLET | Freq: Three times a day (TID) | ORAL | Status: DC
  Administered 2024-11-19 – 2024-11-20 (×3): 25 mg via ORAL
  Filled 2024-11-19 (×4): qty 1

## 2024-11-19 MED ORDER — FUROSEMIDE 10 MG/ML IJ SOLN
20.0000 mg | Freq: Every day | INTRAMUSCULAR | Status: AC
  Administered 2024-11-19: 20 mg via INTRAVENOUS
  Filled 2024-11-19: qty 2

## 2024-11-19 MED ORDER — ONDANSETRON HCL 4 MG PO TABS: 4.0000 mg | ORAL_TABLET | Freq: Four times a day (QID) | ORAL | Status: DC | PRN

## 2024-11-19 NOTE — Assessment & Plan Note (Signed)
 At baseline

## 2024-11-19 NOTE — Assessment & Plan Note (Addendum)
 Suspect heart failure exacerbation however given patient's age, and radiology x-ray read bibasilar consolidation, commune acquired pneumonia cannot be excluded at this time Per EDP, patient SpO2 dropped to 71% with ambulation Suspect fluid overload Complete echo ordered on admission Patient will need cardiology evaluation pending complete echo Strict I's and O's Continue with ceftriaxone  2 g IV daily, doxycycline  100 mg IV twice daily, 5 days ordered Continuous pulse oximetry

## 2024-11-19 NOTE — Assessment & Plan Note (Signed)
 Atorvastatin 10 mg daily

## 2024-11-19 NOTE — ED Notes (Signed)
 Pt transported to Xray via stretcher

## 2024-11-19 NOTE — ED Notes (Signed)
 Virtual hospitalist on ipad in room.

## 2024-11-19 NOTE — Assessment & Plan Note (Addendum)
 Furosemide  40 mg IV daily Home hydralazine  25 mg p.o. 3 times daily, amlodipine  5 mg daily resumed Hydralazine  5 mg IV every 4 hours as needed for SBP greater 165, 5 days ordered

## 2024-11-19 NOTE — ED Notes (Signed)
 Pt ambulated to BR with X 1 assist. Tolerated poorly. Extremely dyspneic on exertion. Became dusky, SaO2 on return to room 71% RA. Endorses dizziness. Pt recovered to 95% with work of breathing improved after in bed and resting. Remains on room air. PA made aware. Pt informed that bedpan, bedside commode, or purewick to be used for the time being. Pt agreeable.

## 2024-11-19 NOTE — ED Notes (Signed)
 Pt bedding changed, brief and chucks changed and pt put into new gown

## 2024-11-19 NOTE — Assessment & Plan Note (Signed)
 Strict I's and O's Continue with furosemide  20 mg IV one-time dose for 1800 hrs. on day of admission followed by furosemide  40 mg IV daily starting on 11/20/2024, one-time dose ordered Complete echo ordered A.m. team to consider consultation to cardiology pending complete echo evaluation

## 2024-11-19 NOTE — H&P (Addendum)
 " History and Physical - Telemedicine  Jennifer Patel FMW:[id number] DOB: 1932/04/29 DOA: 11/19/2024  PCP: Gerome Brunet, DO  Patient coming from: Home  Referring provider: Merl Overland, EDP Telemedicine provider: Dr. Sherre Patient location: [facility] ED, [facility], [place]  Referring diagnosis: Heart failure Patient name and DOB verified: Patient was able to verify her first and last name: Jennifer Patel, date of birth: Mar 06, 1932. Patient consented to Telemedicine Evaluation: Yes RN virtual assistant: Jordan Nickerson, RN Video encounter time and date: 11/19/24 and at approximately: 10:05 Telemedicine method: Research Scientist (physical Sciences) Concern: Shortness of breath, fatigue  HPI: Ms. Jennifer Patel is a 89 year old female with history of CKD stage IIIb, hypertension, resistant hypertension, presents for chief concerns of shortness of breath and fatigue.  Vitals in the ED showed t of 98, rr at 25, hr 77, blood pressure 169/81, SpO2 95% on room air.  Serum sodium is 138, potassium 4.1, chloride 102, bicarb 23, BUN of 23, serum creatinine 1.30, eGFR 39, nonfasting blood glucose 131, WBC 7.1, hemoglobin 10.6, platelets of 340.  proBNP was elevated at 20,388.  TSH was 10.3.  ED treatment: Ceftriaxone  2 g IV one-time dose, Lasix  40 mg IV one-time dose.  11/19/2024: Patient evaluated and admitted via telemedicine encounter. -------------------------- At bedside, patient was able to tell me her first and last name, date of birth.  Patient reports she has been feeling short of breath with a nonproductive cough for several months.  Patient was not able to quantify the number of months.  She reports no swelling.  She reports no known weight gain or loss.  She denies dysuria, hematuria, fever, chills, nausea, vomiting, chest pain, close getting tighter.  She reports she only sleeps with 1 pillow at night.  She reports the shortness of breath is worse with exertion specifically walking.  She denies known  sick contacts.  Social history: She lives at home on her own.  She denies tobacco, EtOH, recreational drug use.  She is retired.  ROS: Constitutional: no weight change, no fever ENT/Mouth: no sore throat, no rhinorrhea Eyes: no eye pain, no vision changes Cardiovascular: no chest pain, + dyspnea,  no edema, no palpitations Respiratory: no cough, no sputum, no wheezing Gastrointestinal: no nausea, no vomiting, no diarrhea, no constipation Genitourinary: no urinary incontinence, no dysuria, no hematuria Musculoskeletal: no arthralgias, no myalgias Skin: no skin lesions, no pruritus, Neuro: + weakness, no loss of consciousness, no syncope Psych: no anxiety, no depression, + decrease appetite Heme/Lymph: no bruising, no bleeding  ED Course: Discussed with EDP, patient requiring hospitalization for chief concerns of acute hypoxic respiratory failure with ambulation, suspect secondary to heart failure.  Assessment/Plan  Principal Problem:   Acute exacerbation of CHF (congestive heart failure) (HCC) Active Problems:   Acute hypoxic respiratory failure (HCC)   Resistant hypertension   Hypertension   Hypothyroidism   CKD stage 3b, GFR 30-44 ml/min (HCC)   GERD (gastroesophageal reflux disease)   Hyperlipidemia   Assessment and Plan:  * Acute exacerbation of CHF (congestive heart failure) (HCC) Strict I's and O's Continue with furosemide  20 mg IV one-time dose for 1800 hrs. on day of admission followed by furosemide  40 mg IV daily starting on 11/20/2024, one-time dose ordered Complete echo ordered A.m. team to consider consultation to cardiology pending complete echo evaluation  Acute hypoxic respiratory failure (HCC) Suspect heart failure exacerbation however given patient's age, and radiology x-ray read bibasilar consolidation, commune acquired pneumonia cannot be excluded at this time Per EDP, patient SpO2  dropped to 71% with ambulation Suspect fluid overload Complete echo  ordered on admission Patient will need cardiology evaluation pending complete echo Strict I's and O's Continue with ceftriaxone  2 g IV daily, doxycycline  100 mg IV twice daily, 5 days ordered Continuous pulse oximetry  GERD (gastroesophageal reflux disease) Home PPI resumed  CKD stage 3b, GFR 30-44 ml/min (HCC) At baseline  Hypothyroidism Home levothyroxine  50 mcg daily resumed  Hypertension Furosemide  40 mg IV daily Home hydralazine  25 mg p.o. 3 times daily, amlodipine  5 mg daily resumed Hydralazine  5 mg IV every 4 hours as needed for SBP greater 165, 5 days ordered  Hyperlipidemia Atorvastatin  10 mg daily  Chart reviewed.   DVT prophylaxis: Enoxaparin  Code Status: Full code Diet: Heart healthy Family Communication: Attempted to call daughter, Sherrilyn at [phone].  There was no pickup and voicemail greeting did not indicate a name.  Message not left for HIPAA protection. Disposition Plan: Pending clinical course Consults called: None at this time, a.m. team to consider consultation to cardiology pending complete echo Admission status: Telemetry, inpatient  Past Medical History:  Diagnosis Date   High cholesterol    Hypertension    Renal insufficiency    Thyroid  disease    Past Surgical History:  Procedure Laterality Date   ABDOMINAL HYSTERECTOMY     CHOLECYSTECTOMY     ORIF FEMUR FRACTURE Right 03/12/2020   Procedure: OPEN REDUCTION INTERNAL FIXATION (ORIF) DISTAL FEMUR FRACTURE;  Surgeon: Kendal Franky SQUIBB, MD;  Location: [facility] OR;  Service: Orthopedics;  Laterality: Right;   PERIPHERAL VASCULAR CATHETERIZATION N/A 11/16/2015   Procedure: Renal Angiography;  Surgeon: Gordy Bergamo, MD;  Location: [facility] INVASIVE CV LAB;  Service: Cardiovascular;  Laterality: N/A;   Social History:  reports that she has quit smoking. She has never used smokeless tobacco. She reports that she does not drink alcohol  and does not use drugs.  Allergies[1] Family History  Problem Relation Age of  Onset   Hypertension Sister    Hypertension Brother    Family history: Family history reviewed and not pertinent.  Prior to Admission medications  Medication Sig Start Date End Date Taking? Authorizing Provider  acetaminophen  (TYLENOL ) 500 MG tablet Take 1 tablet (500 mg total) by mouth every 6 (six) hours as needed (pain). 03/31/20  Yes Lassen, Arlo C, PA-C  amLODipine  (NORVASC ) 5 MG tablet Take 5 mg by mouth daily. 10/31/24  Yes [provider]  atorvastatin  (LIPITOR) 10 MG tablet Take 1 tablet (10 mg total) by mouth daily. Reported on 11/11/2015 03/31/20  Yes Lassen, Arlo C, PA-C  hydrALAZINE  (APRESOLINE ) 25 MG tablet Take 1 tablet (25 mg total) by mouth 3 (three) times daily. 03/31/20  Yes Lassen, Arlo C, PA-C  lansoprazole  (PREVACID ) 15 MG capsule Take 1 capsule (15 mg total) by mouth daily at 12 noon. 03/31/20  Yes Lassen, Arlo C, PA-C  levothyroxine  (SYNTHROID ) 75 MCG tablet Take 1 tablet (75 mcg total) by mouth daily. 03/31/20  Yes Lassen, Arlo C, PA-C  loperamide  (IMODIUM ) 2 MG capsule Take 1 capsule (2 mg total) by mouth 4 (four) times daily as needed for diarrhea or loose stools. 01/20/24  Yes Elnor Savant A, DO  ondansetron  (ZOFRAN ) 4 MG tablet Take 1 tablet (4 mg total) by mouth every 4 (four) hours as needed for nausea or vomiting. 01/20/24  Yes Elnor Savant LABOR, DO  Polyethyl Glycol-Propyl Glycol (SYSTANE OP) Place 1 drop into both eyes 2 (two) times daily as needed (dry eyes).   Yes [provider]  SYNTHROID  50 MCG tablet Take 50 mcg by mouth every morning. 09/04/24  Yes [provider]  allopurinol  (ZYLOPRIM ) 100 MG tablet Take 2 tablets (200 mg total) by mouth daily. Patient not taking: Reported on 11/19/2024 03/31/20   Lassen, Arlo C, PA-C  Cyanocobalamin  (VITAMIN B-12 IJ) Inject as directed every 30 (thirty) days. Next injection due 05/24/15 at Dr. Hartwell office Patient not taking: Reported on 11/19/2024    [provider]  enoxaparin  (LOVENOX ) 30 MG/0.3ML  injection Inject 0.3 mLs (30 mg total) into the skin daily. Patient not taking: Reported on 11/19/2024 03/15/20 11/04/24  Danton Lauraine LABOR, PA-C  furosemide  (LASIX ) 20 MG tablet Take 1 tablet (20 mg total) by mouth every Monday, Wednesday, and Friday. Patient not taking: Reported on 11/19/2024 03/31/20   Lassen, Arlo C, PA-C  HYDROcodone -acetaminophen  (NORCO/VICODIN) 5-325 MG tablet Take 1 tablet by mouth every 6 (six) hours as needed for severe pain. Patient not taking: Reported on 11/19/2024 03/14/20   Danton Lauraine LABOR, PA-C  Nebivolol  HCl (BYSTOLIC ) 20 MG TABS Take 1 tablet (20 mg total) by mouth daily. Patient not taking: Reported on 11/19/2024 03/31/20   Lassen, Arlo C, PA-C  spironolactone  (ALDACTONE ) 25 MG tablet Take 0.5 tablets (12.5 mg total) by mouth daily. Patient not taking: Reported on 11/19/2024 03/31/20   Nicoletta Philmore BROCKS, PA-C   Physical Exam completed with assistance of: Jordan Nickerson, RN, who was at bedside during this portion of the virtual encounter:  Vitals:   11/19/24 1600 11/19/24 1806 11/19/24 1900 11/19/24 1923  BP: 118/66  (!) 148/78 132/81  Pulse: 75  79 79  Resp: 18  (!) 23 14  Temp:  98.2 F (36.8 C)  98.2 F (36.8 C)  TempSrc:      SpO2: 97%  96% 98%  Weight:      Height:       Constitutional: appears age-appropriate, frail, anxious Eyes: EOMI,  conjunctivae normal ENMT: Mucous membranes are moist. Hearing appropriate Neck: normal, supple, no masses, no thyromegaly Respiratory: clear to auscultation bilaterally, no wheezing. Normal respiratory effort. No accessory muscle use.  Cardiovascular: Regular rate and rhythm, no murmurs. No extremity edema. 2+ pedal pulses. Abdomen: no tenderness. Bowel sounds positive.  Musculoskeletal: No joint deformity upper and lower extremities. Good ROM, no contractures, no atrophy. Skin: no rashes, ulcers on visible skin Neurologic: Strength is appropriate upper extremities.  Psychiatric: Normal judgment and insight. Alert and  oriented x 3. Normal mood.   EKG: independently reviewed, showing sinus rhythm with rate of 82, QTc 407, LVH  Chest x-ray on Admission: I personally reviewed and I agree with radiologist reading as below.  DG Chest 2 View Result Date: 11/19/2024 EXAM: 2 VIEW(S) XRAY OF THE CHEST 11/19/2024 07:32:16 AM COMPARISON: 10/19/23. CLINICAL HISTORY: Shortness of breath. FINDINGS: LUNGS AND PLEURA: Pulmonary interstitial prominence with mild pulmonary edema. Bibasilar consolidation. Bilateral pleural effusions. No pneumothorax. HEART AND MEDIASTINUM: Cardiomegaly. Calcified aorta. BONES AND SOFT TISSUES: Degenerative changes of spine and scoliosis. Large hiatal hernia. No acute fracture. IMPRESSION: 1. Findings most consistent with congestive heart failure. 2. Bibasilar consolidation may represent atelectasis and/or aspiration/pneumonia. 3. Cardiomegaly. Electronically signed by: Waddell Calk MD 11/19/2024 07:41 AM EST RP Workstation: [id number]   Labs on Admission: I have personally reviewed following labs.  CBC: Recent Labs  Lab 11/19/24 0800  WBC 7.1  NEUTROABS 5.8  HGB 10.6*  HCT 32.5*  MCV 94.8  PLT 340   Basic Metabolic Panel: Recent Labs  Lab 11/19/24 0800  NA 138  K 4.1  CL 102  CO2 23  GLUCOSE 131*  BUN 23  CREATININE 1.30*  CALCIUM  9.3   GFR: Estimated Creatinine Clearance: 19.6 mL/min (A) (by C-G formula based on SCr of 1.3 mg/dL (H)).  BNP (last 3 results) Recent Labs    11/19/24 0800  PROBNP 20,388.0*   Thyroid  Function Tests: Recent Labs    11/19/24 0800  TSH 10.300*   Urine analysis:    Component Value Date/Time   COLORURINE YELLOW 11/19/2024 0800   APPEARANCEUR CLEAR 11/19/2024 0800   LABSPEC 1.011 11/19/2024 0800   PHURINE 5.0 11/19/2024 0800   GLUCOSEU NEGATIVE 11/19/2024 0800   HGBUR NEGATIVE 11/19/2024 0800   BILIRUBINUR NEGATIVE 11/19/2024 0800   KETONESUR NEGATIVE 11/19/2024 0800   PROTEINUR NEGATIVE 11/19/2024 0800   UROBILINOGEN 0.2  08/17/2015 1145   NITRITE NEGATIVE 11/19/2024 0800   LEUKOCYTESUR NEGATIVE 11/19/2024 0800   This document was prepared using Dragon Voice Recognition software and may include unintentional dictation errors.  Dr. Sherre [facility] My Location: [place]  If 7PM-7AM, please contact overnight-coverage provider If 7AM-7PM, please contact day attending provider [email or web address]  11/19/2024, 7:26 PM      [1]  Allergies Allergen Reactions   Clarithromycin Anaphylaxis and Swelling    Throat swelling   Prednisone  Anaphylaxis and Swelling    Throat swelling and tightness from throat to stomach (05/22/15)   "

## 2024-11-19 NOTE — Hospital Course (Signed)
 Ms. Jennifer Patel is a 89 year old female with history of CKD stage IIIb, hypertension, resistant hypertension, presents for chief concerns of shortness of breath and fatigue.  Vitals in the ED showed t of 98, rr at 25, hr 77, blood pressure 169/81, SpO2 95% on room air.  Serum sodium is 138, potassium 4.1, chloride 102, bicarb 23, BUN of 23, serum creatinine 1.30, eGFR 39, nonfasting blood glucose 131, WBC 7.1, hemoglobin 10.6, platelets of 340.  proBNP was elevated at 20,388.  TSH was 10.3.  ED treatment: Ceftriaxone  2 g IV one-time dose, Lasix  40 mg IV one-time dose.  11/19/2024: Patient evaluated and admitted via telemedicine encounter.

## 2024-11-19 NOTE — Assessment & Plan Note (Signed)
 Home PPI resumed

## 2024-11-19 NOTE — ED Triage Notes (Signed)
 Pt BIB Ems due to SOB, dry cough and fatigue since yesterday. Pt called PCP yesterday and spoke with the nurse, but nothing was done. When ems arrived pt was 90% on RA and placed on 2L [place] O2 was 96%.

## 2024-11-19 NOTE — Assessment & Plan Note (Addendum)
 Home levothyroxine 50 mcg daily resumed

## 2024-11-19 NOTE — ED Provider Notes (Signed)
 " [place] EMERGENCY DEPARTMENT AT [facility] Provider Note   CSN: [id number] Arrival date & time: 11/19/24  9364     Patient presents with: Shortness of Breath and Fatigue   Jennifer Patel is a 89 y.o. female.   89 y.o female wih a PMH of HTN, Thyroid  presents to the ED with a chief complaint of shortness of breath and fatigue.  Patient reports for the past couple days she has felt really short of breath along with fatigue.  She is endorsing a productive cough, has been taking some cough drops to help with her symptoms without much improvement.  There is no alleviating factors.  She is not endorsing any chest pain, no fever, no leg swelling. According to EMS report, patient was found with O2 saturations below 90% and placed on 2 L.  This was discontinued when she arrived to the emergency department.  The history is provided by the patient and the EMS personnel.  Shortness of Breath Associated symptoms: no abdominal pain, no chest pain, no fever and no vomiting   ][     Prior to Admission medications  Medication Sig Start Date End Date Taking? Authorizing Provider  acetaminophen  (TYLENOL ) 500 MG tablet Take 1 tablet (500 mg total) by mouth every 6 (six) hours as needed (pain). 03/31/20   Lassen, Arlo C, PA-C  allopurinol  (ZYLOPRIM ) 100 MG tablet Take 2 tablets (200 mg total) by mouth daily. 03/31/20   Lassen, Arlo C, PA-C  atorvastatin  (LIPITOR) 10 MG tablet Take 1 tablet (10 mg total) by mouth daily. Reported on 11/11/2015 03/31/20   Lassen, Arlo C, PA-C  benzonatate  (TESSALON ) 100 MG capsule Take 1 capsule (100 mg total) by mouth every 8 (eight) hours. 10/19/23   Mannie Pac T, DO  Cyanocobalamin  (VITAMIN B-12 IJ) Inject as directed every 30 (thirty) days. Next injection due 05/24/15 at Dr. Hartwell office    [provider]  enoxaparin  (LOVENOX ) 30 MG/0.3ML injection Inject 0.3 mLs (30 mg total) into the skin daily. 03/15/20 11/04/24  Danton Lauraine LABOR, PA-C  furosemide   (LASIX ) 20 MG tablet Take 1 tablet (20 mg total) by mouth every Monday, Wednesday, and Friday. 03/31/20   Lassen, Arlo C, PA-C  hydrALAZINE  (APRESOLINE ) 25 MG tablet Take 1 tablet (25 mg total) by mouth 3 (three) times daily. 03/31/20   Lassen, Arlo C, PA-C  HYDROcodone -acetaminophen  (NORCO/VICODIN) 5-325 MG tablet Take 1 tablet by mouth every 6 (six) hours as needed for severe pain. 03/14/20   Danton Lauraine LABOR, PA-C  lansoprazole  (PREVACID ) 15 MG capsule Take 1 capsule (15 mg total) by mouth daily at 12 noon. 03/31/20   Lassen, Arlo C, PA-C  levothyroxine  (SYNTHROID ) 75 MCG tablet Take 1 tablet (75 mcg total) by mouth daily. 03/31/20   Lassen, Arlo C, PA-C  loperamide  (IMODIUM ) 2 MG capsule Take 1 capsule (2 mg total) by mouth 4 (four) times daily as needed for diarrhea or loose stools. 01/20/24   Elnor Jayson LABOR, DO  Nebivolol  HCl (BYSTOLIC ) 20 MG TABS Take 1 tablet (20 mg total) by mouth daily. 03/31/20   Lassen, Arlo C, PA-C  ondansetron  (ZOFRAN ) 4 MG tablet Take 1 tablet (4 mg total) by mouth every 4 (four) hours as needed for nausea or vomiting. 01/20/24   Elnor Jayson LABOR, DO  Polyethyl Glycol-Propyl Glycol (SYSTANE OP) Place 1 drop into both eyes 2 (two) times daily as needed (dry eyes).    [provider]  spironolactone  (ALDACTONE ) 25 MG tablet Take 0.5  tablets (12.5 mg total) by mouth daily. 03/31/20   Lassen, Arlo C, PA-C    Allergies: Clarithromycin and Prednisone     Review of Systems  Constitutional:  Negative for chills and fever.  Respiratory:  Positive for shortness of breath.   Cardiovascular:  Negative for chest pain and leg swelling.  Gastrointestinal:  Negative for abdominal pain, nausea and vomiting.  Genitourinary:  Negative for flank pain.  Musculoskeletal:  Negative for back pain.  All other systems reviewed and are negative.   Updated Vital Signs BP (!) 159/77   Pulse 80   Temp 98 F (36.7 C) (Oral)   Resp (!) 25   Ht 4' 11 (1.499 m)   Wt 45.4 kg   SpO2 91%   BMI  20.20 kg/m   Physical Exam Vitals and nursing note reviewed.  HENT:     Head: Normocephalic.  Cardiovascular:     Rate and Rhythm: Normal rate.  Pulmonary:     Effort: Pulmonary effort is normal. Tachypnea present.     Breath sounds: Examination of the right-lower field reveals rales. Examination of the left-lower field reveals rales. Decreased breath sounds and rales present.  Abdominal:     Palpations: Abdomen is soft.     Tenderness: There is no abdominal tenderness.  Musculoskeletal:     Cervical back: Normal range of motion and neck supple.     Right lower leg: No edema.     Left lower leg: No edema.  Skin:    General: Skin is warm and dry.  Neurological:     Mental Status: She is alert.     (all labs ordered are listed, but only abnormal results are displayed) Labs Reviewed  BASIC METABOLIC PANEL WITH GFR - Abnormal; Notable for the following components:      Result Value   Glucose, Bld 131 (*)    Creatinine, Ser 1.30 (*)    GFR, Estimated 39 (*)    All other components within normal limits  CBC WITH DIFFERENTIAL/PLATELET - Abnormal; Notable for the following components:   RBC 3.43 (*)    Hemoglobin 10.6 (*)    HCT 32.5 (*)    All other components within normal limits  PRO BRAIN NATRIURETIC PEPTIDE - Abnormal; Notable for the following components:   Pro Brain Natriuretic Peptide 20,388.0 (*)    All other components within normal limits  TSH - Abnormal; Notable for the following components:   TSH 10.300 (*)    All other components within normal limits  RESP PANEL BY RT-PCR (RSV, FLU A&B, COVID)  [id number]  URINALYSIS, ROUTINE W REFLEX MICROSCOPIC    EKG: EKG Interpretation Date/Time:  Wednesday November 19 2024 07:51:52 EST Ventricular Rate:  82 PR Interval:  112 QRS Duration:  73 QT Interval:  348 QTC Calculation: 407 R Axis:   24  Text Interpretation: Sinus rhythm Borderline short PR interval LVH with secondary repolarization abnormality Confirmed by  Simon Rea [phone]) on 11/19/2024 7:56:12 AM  Radiology: [facility] Chest 2 View Result Date: 11/19/2024 EXAM: 2 VIEW(S) XRAY OF THE CHEST 11/19/2024 07:32:16 AM COMPARISON: 10/19/23. CLINICAL HISTORY: Shortness of breath. FINDINGS: LUNGS AND PLEURA: Pulmonary interstitial prominence with mild pulmonary edema. Bibasilar consolidation. Bilateral pleural effusions. No pneumothorax. HEART AND MEDIASTINUM: Cardiomegaly. Calcified aorta. BONES AND SOFT TISSUES: Degenerative changes of spine and scoliosis. Large hiatal hernia. No acute fracture. IMPRESSION: 1. Findings most consistent with congestive heart failure. 2. Bibasilar consolidation may represent atelectasis and/or aspiration/pneumonia. 3. Cardiomegaly. Electronically signed by: Waddell Calk MD  11/19/2024 07:41 AM EST RP Workstation: [id number]     Procedures   Medications Ordered in the ED  cefTRIAXone  (ROCEPHIN ) 2 g in sodium chloride  0.9 % 100 mL IVPB (has no administration in time range)  furosemide  (LASIX ) injection 40 mg (40 mg Intravenous Given 11/19/24 0857)    Clinical Course as of 11/19/24 0949  Wed Nov 19, 2024  0850 Pro Brain Natriuretic Peptide(!): 79,611.9 [JS]    Clinical Course User Index [JS] [place] Silveria, PA-C                                 Medical Decision Making Amount and/or Complexity of Data Reviewed Labs: ordered. Decision-making details documented in ED Course. Radiology: ordered.  Risk Prescription drug management.   This patient presents to the ED for concern of shortness of breath, fatigue, this involves a number of treatment options, and is a complaint that carries with it a high risk of complications and morbidity.  The differential diagnosis includes infection, heart failure, ACS.   Co morbidities: Discussed in HPI   Brief History:  See HPI.   EMR reviewed including pt PMHx, past surgical history and past visits to ER.   See HPI for more details   Lab Tests:  I ordered and independently  interpreted labs.  The pertinent results include:    CBC with no leukocytosis, hemoglobin is within normal limits.  BMP with creatinine of 1.3 at her baseline.  No electrolyte derangement.  TSH is also high at 10.3.  Urinalysis without any signs of infection.  proBNP is 20,000, some concern for new exacerbation of heart failure.  Imaging Studies:  Chest x-ray showed: IMPRESSION:  1. Findings most consistent with congestive heart failure.  2. Bibasilar consolidation may represent atelectasis and/or  aspiration/pneumonia.  3. Cardiomegaly.    Cardiac Monitoring:  The patient was maintained on a cardiac monitor.  I personally viewed and interpreted the cardiac monitored which showed an underlying rhythm of:NSR 82 EKG non-ischemic   Medicines ordered:  I ordered medication including lasix   for diuresis  Reevaluation of the patient after these medicines showed that the patient stayed the same I have reviewed the patients home medicines and have made adjustments as needed  Reevaluation:  After the interventions noted above I re-evaluated patient and found that they have :stayed the same  Social Determinants of Health:   Patient currently lives at home in does all her ADLs herself.  Problem List / ED Course:  Patient presented to the ED with shortness of breath along with fatigue that have been ongoing for some time , she also endorses a productive cough over the last couple days.  According to EMS report she was noted to be at 90% on room air when they arrived at the scene.  Here her oxygen was discontinue 2 L nasal cannula by staff.  She was satting at 95%.  She does get very tachypneic along with hypoxic whenever she ambulates.  She had an episode here where she was ambulated by nursing staff to the bathroom and O2 dropped all the way to 71%.  Respirations in the low 30s.  She has been afebrile during her stay. Her labs are within her normal limits.  No electrolyte derangement,  creatinine is at her baseline.  CBC with no leukocytosis.  Her TSH is concernedly elevated at 10, she does have underlying history of thyroid  disease.  Her proBNP is also significant  elevated at 20,000, some concern for new onset of heart failure. She does have crackles bilaterally however no wheezing.  Some concerns for CHF exacerbation versus pneumonia.  Because patient is also endorsing fatigue and a productive cough we will treat for CAP at this time as well.  Diuresis such as Lasix  has been ordered for patient to help with her work of breathing, but she will also need some Rocephin  2 g to cover for CAP. She remains hemodynamically stable will place called for hospitalist for further admission. Spoke to hospitalist service who will admit patient for further management.  She is a virtual admit her and will need nursing staff in order to help complete an assessment.   Dispostion:  After consideration of the diagnostic results and the patients response to treatment, I feel that the patent would benefit from admission for treatment of CHF exacerbation versus pneumonia.   Portions of this note were generated with Scientist, clinical (histocompatibility and immunogenetics). Dictation errors may occur despite best attempts at proofreading.   Final diagnoses:  Dyspnea, unspecified type  Community acquired pneumonia, unspecified laterality  Heart failure, unspecified HF chronicity, unspecified heart failure type [facility])    ED Discharge Orders     None          Sonya Pucci, PA-C 11/19/24 9050    Simon Lavonia SAILOR, MD 11/19/24 1619  "

## 2024-11-20 ENCOUNTER — Inpatient Hospital Stay (HOSPITAL_COMMUNITY)

## 2024-11-20 DIAGNOSIS — R0609 Other forms of dyspnea: Secondary | ICD-10-CM

## 2024-11-20 DIAGNOSIS — I509 Heart failure, unspecified: Secondary | ICD-10-CM | POA: Diagnosis not present

## 2024-11-20 LAB — CBC
HCT: 34.9 % — ABNORMAL LOW (ref 36.0–46.0)
Hemoglobin: 11.7 g/dL — ABNORMAL LOW (ref 12.0–15.0)
MCH: 30.7 pg (ref 26.0–34.0)
MCHC: 33.5 g/dL (ref 30.0–36.0)
MCV: 91.6 fL (ref 80.0–100.0)
Platelets: 363 10*3/uL (ref 150–400)
RBC: 3.81 MIL/uL — ABNORMAL LOW (ref 3.87–5.11)
RDW: 12.9 % (ref 11.5–15.5)
WBC: 5.9 10*3/uL (ref 4.0–10.5)
nRBC: 0 % (ref 0.0–0.2)

## 2024-11-20 LAB — BASIC METABOLIC PANEL WITH GFR
Anion gap: 16 — ABNORMAL HIGH (ref 5–15)
BUN: 23 mg/dL (ref 8–23)
CO2: 25 mmol/L (ref 22–32)
Calcium: 8.9 mg/dL (ref 8.9–10.3)
Chloride: 96 mmol/L — ABNORMAL LOW (ref 98–111)
Creatinine, Ser: 1.41 mg/dL — ABNORMAL HIGH (ref 0.44–1.00)
GFR, Estimated: 35 mL/min — ABNORMAL LOW
Glucose, Bld: 90 mg/dL (ref 70–99)
Potassium: 3.4 mmol/L — ABNORMAL LOW (ref 3.5–5.1)
Sodium: 137 mmol/L (ref 135–145)

## 2024-11-20 LAB — T4, FREE: Free T4: 1.2 ng/dL (ref 0.80–2.00)

## 2024-11-20 LAB — ECHOCARDIOGRAM COMPLETE
AR max vel: 1.59 cm2
AV Area VTI: 1.47 cm2
AV Area mean vel: 1.33 cm2
AV Mean grad: 23 mmHg
AV Peak grad: 36.2 mmHg
Ao pk vel: 3.01 m/s
Area-P 1/2: 2.35 cm2
Calc EF: 55.3 %
Height: 59 in
S' Lateral: 2.6 cm
Single Plane A2C EF: 55.4 %
Single Plane A4C EF: 53.8 %
Weight: 1598.4 [oz_av]

## 2024-11-20 MED ORDER — SODIUM CHLORIDE 0.9 % IV SOLN
2.0000 g | INTRAVENOUS | Status: DC
  Administered 2024-11-20: 2 g via INTRAVENOUS
  Filled 2024-11-20: qty 20

## 2024-11-20 MED ORDER — FUROSEMIDE 20 MG PO TABS: 20.0000 mg | ORAL_TABLET | Freq: Every day | ORAL | Status: DC

## 2024-11-20 MED ORDER — DOXYCYCLINE HYCLATE 100 MG PO TABS
100.0000 mg | ORAL_TABLET | Freq: Two times a day (BID) | ORAL | Status: DC
  Administered 2024-11-20 – 2024-11-22 (×3): 100 mg via ORAL
  Filled 2024-11-20 (×4): qty 1

## 2024-11-20 MED ORDER — POTASSIUM CHLORIDE CRYS ER 20 MEQ PO TBCR
40.0000 meq | EXTENDED_RELEASE_TABLET | Freq: Once | ORAL | Status: AC
  Administered 2024-11-20: 40 meq via ORAL
  Filled 2024-11-20: qty 2

## 2024-11-20 MED ORDER — PERFLUTREN LIPID MICROSPHERE
1.0000 mL | INTRAVENOUS | Status: AC | PRN
  Administered 2024-11-20: 2 mL via INTRAVENOUS

## 2024-11-20 MED ORDER — HALOPERIDOL LACTATE 5 MG/ML IJ SOLN
2.0000 mg | Freq: Four times a day (QID) | INTRAMUSCULAR | Status: DC | PRN
  Administered 2024-11-20: 2 mg via INTRAVENOUS
  Filled 2024-11-20: qty 1

## 2024-11-20 NOTE — Care Management Obs Status (Signed)
 MEDICARE OBSERVATION STATUS NOTIFICATION   Patient Details  Name: Jennifer Patel MRN: [id number] Date of Birth: 30-Aug-1932   Medicare Observation Status Notification Given:  Yes    Sudie Erminio Deems, RN 11/20/2024, 10:30 AM

## 2024-11-20 NOTE — Progress Notes (Signed)
 Pt has been very restless and getting up multiple times. Pt pull off her tele monitor and has refused to put it back. MD was notified. New medication order was added and safety observation was also ordered.

## 2024-11-20 NOTE — Progress Notes (Addendum)
 " PROGRESS NOTE  Jennifer Patel  FMW:[id number] DOB: 18-Jun-1932 DOA: 11/19/2024 PCP: Gerome Brunet, DO   Brief Narrative: Patient is a 89 year old female with history of CKD stage IIIb with baseline creatinine around 1.3, hypertension, scoliosis who presented with complaint of shortness of breath, fatigue.  She is from senior apartments at [place].  On presentation she was hypertensive, hypoxic. lab work showed severely elevated BNP, creatinine 1.3, TSH of 10.3.  Chest x-ray showed features of volume overload, cardiomegaly, bibasilar consolidations suspicious for atelectasis versus pneumonia.  Started on antibiotic, given few days of IV Lasix .  Echo pending  Assessment & Plan:  Principal Problem:   Acute exacerbation of CHF (congestive heart failure) (HCC) Active Problems:   Acute hypoxic respiratory failure (HCC)   Resistant hypertension   Hypertension   Hypothyroidism   CKD stage 3b, GFR 30-44 ml/min (HCC)   GERD (gastroesophageal reflux disease)   Hyperlipidemia  Suspected CHF exacerbation: No prior echo on system.  Severely elevated BNP on presentation.  Remains on room air now.  Given a dose of IV Lasix . Chest x-ray shows features of CHF, cardiomegaly. She was taking Lasix  20 mg 3 times a week but apparently not taking at present. Echo pending.  Further IV Lasix  on hold.  No peripheral edema.  Has few crackles in bases  Acute hypoxic respiratory failure: On presentation her oxygenation dropped to 70s with ambulation.  Currently on room air.  Suspected pneumonia versus atelectasis: Chest x-ray with bibasilar consolidation suspicious for atelectasis versus pneumonia.  Though currently she does not have any signs of pneumonia, we will continue with ceftriaxone  doxycycline  for now.  Afebrile, no leukocytosis  CKD stage IIIb: Currently kidney function at baseline.  Baseline creatinine 1.3  GERD: Continue PPI  Hypothyroidism: Takes levothyroxine  50 mcg daily.  Continue.  Elevated  TSH.  Free T4 pending.  Hypertension: Hypertensive on presentation.  Currently normotensive  Hyperlipidemia: On Lipitor  Debility/deconditioning/severe scoliosis: Patient lives at senior apartments at [place].  PT recommended home health on discharge.  TOC following.  She ambulates with a walker           DVT prophylaxis:enoxaparin  (LOVENOX ) injection 30 mg Start: 11/19/24 2200 Place TED hose Start: 11/19/24 1026     Code Status: Full Code  Family Communication: Called grandson Franky on phone, call not received  Patient status:obs  Patient is from : Home  Anticipated discharge to: Home  Estimated DC date: Tomorrow   Consultants: None  Procedures: None  Antimicrobials:  Anti-infectives (From admission, onward)    Start     Dose/Rate Route Frequency Ordered Stop   11/20/24 1300  cefTRIAXone  (ROCEPHIN ) 2 g in sodium chloride  0.9 % 100 mL IVPB        2 g 200 mL/hr over 30 Minutes Intravenous Every 24 hours 11/20/24 1204 11/24/24 1259   11/20/24 1000  doxycycline  (VIBRA -TABS) tablet 100 mg        100 mg Oral Every 12 hours 11/20/24 0705 11/24/24 2159   11/19/24 1900  doxycycline  (VIBRAMYCIN ) 100 mg in sodium chloride  0.9 % 250 mL IVPB  Status:  Discontinued        100 mg 125 mL/hr over 120 Minutes Intravenous Every 12 hours 11/19/24 1849 11/20/24 0705   11/19/24 0915  cefTRIAXone  (ROCEPHIN ) 2 g in sodium chloride  0.9 % 100 mL IVPB        2 g 200 mL/hr over 30 Minutes Intravenous  Once 11/19/24 0910 11/19/24 1031       Subjective: Patient  seen and examined at the bedside today.  Hemodynamically stable.  Overall appears comfortable.  Lying in bed.  On room air.  Does not complain of worsening shortness of breath or cough.  Eager to be discharged.  We discussed about the importance of waiting for echocardiogram report.  We discussed about monitoring her another day  Objective: Vitals:   11/20/24 0011 11/20/24 0343 11/20/24 0812 11/20/24 1242  BP: (!) 150/76  135/65 111/76 106/67  Pulse: 82 78 85 84  Resp: 18 17 17 17   Temp: 98 F (36.7 C) 98.1 F (36.7 C) 97.6 F (36.4 C) 97.9 F (36.6 C)  TempSrc: Oral Oral Axillary Oral  SpO2: 97% 100% 94% 96%  Weight:  45.3 kg    Height:        Intake/Output Summary (Last 24 hours) at 11/20/2024 1302 Last data filed at 11/20/2024 0730 Gross per 24 hour  Intake 70 ml  Output 200 ml  Net -130 ml   Filed Weights   11/19/24 0645 11/19/24 1945 11/20/24 0343  Weight: 45.4 kg 44.6 kg 45.3 kg    Examination:  General exam: Overall comfortable, not in distress, pleasant elderly female HEENT: PERRL Respiratory system: Bibasilar crackles Cardiovascular system: S1 & S2 heard, RRR.  Gastrointestinal system: Abdomen is nondistended, soft and nontender. Central nervous system: Alert and oriented Extremities: No edema, no clubbing ,no cyanosis Skin: No rashes, no ulcers,no icterus   Skin: Severe scoliosis   Data Reviewed: I have personally reviewed following labs and imaging studies  CBC: Recent Labs  Lab 11/19/24 0800 11/20/24 0410  WBC 7.1 5.9  NEUTROABS 5.8  --   HGB 10.6* 11.7*  HCT 32.5* 34.9*  MCV 94.8 91.6  PLT 340 363   Basic Metabolic Panel: Recent Labs  Lab 11/19/24 0800 11/20/24 0410  NA 138 137  K 4.1 3.4*  CL 102 96*  CO2 23 25  GLUCOSE 131* 90  BUN 23 23  CREATININE 1.30* 1.41*  CALCIUM  9.3 8.9     Recent Results (from the past 240 hours)  Resp panel by RT-PCR (RSV, Flu A&B, Covid) Anterior Nasal Swab     Status: None   Collection Time: 11/19/24  6:58 AM   Specimen: Anterior Nasal Swab  Result Value Ref Range Status   SARS Coronavirus 2 by RT PCR NEGATIVE NEGATIVE Final   Influenza A by PCR NEGATIVE NEGATIVE Final   Influenza B by PCR NEGATIVE NEGATIVE Final    Comment: (NOTE) The Xpert Xpress SARS-CoV-2/FLU/RSV plus assay is intended as an aid in the diagnosis of influenza from Nasopharyngeal swab specimens and should not be used as a sole basis for  treatment. Nasal washings and aspirates are unacceptable for Xpert Xpress SARS-CoV-2/FLU/RSV testing.  Fact Sheet for Patients: bloggercourse.com  Fact Sheet for Healthcare Providers: seriousbroker.it  This test is not yet approved or cleared by the [place]  FDA and has been authorized for detection and/or diagnosis of SARS-CoV-2 by FDA under an Emergency Use Authorization (EUA). This EUA will remain in effect (meaning this test can be used) for the duration of the COVID-19 declaration under Section 564(b)(1) of the Act, 21 U.S.C. section 360bbb-3(b)(1), unless the authorization is terminated or revoked.     Resp Syncytial Virus by PCR NEGATIVE NEGATIVE Final    Comment: (NOTE) Fact Sheet for Patients: bloggercourse.com  Fact Sheet for Healthcare Providers: seriousbroker.it  This test is not yet approved or cleared by the [place]  FDA and has been authorized for detection and/or diagnosis of  SARS-CoV-2 by FDA under an Emergency Use Authorization (EUA). This EUA will remain in effect (meaning this test can be used) for the duration of the COVID-19 declaration under Section 564(b)(1) of the Act, 21 U.S.C. section 360bbb-3(b)(1), unless the authorization is terminated or revoked.  Performed at [facility], [address] [place], [place] [address]      Radiology Studies: DG Chest 2 View Result Date: 11/19/2024 EXAM: 2 VIEW(S) XRAY OF THE CHEST 11/19/2024 07:32:16 AM COMPARISON: 10/19/23. CLINICAL HISTORY: Shortness of breath. FINDINGS: LUNGS AND PLEURA: Pulmonary interstitial prominence with mild pulmonary edema. Bibasilar consolidation. Bilateral pleural effusions. No pneumothorax. HEART AND MEDIASTINUM: Cardiomegaly. Calcified aorta. BONES AND SOFT TISSUES: Degenerative changes of spine and scoliosis. Large hiatal hernia. No acute fracture. IMPRESSION: 1.  Findings most consistent with congestive heart failure. 2. Bibasilar consolidation may represent atelectasis and/or aspiration/pneumonia. 3. Cardiomegaly. Electronically signed by: Waddell Calk MD 11/19/2024 07:41 AM EST RP Workstation: [id number]    Scheduled Meds:  amLODipine   5 mg Oral Daily   atorvastatin   10 mg Oral Daily   docusate sodium   100 mg Oral BID   doxycycline   100 mg Oral Q12H   enoxaparin  (LOVENOX ) injection  30 mg Subcutaneous QHS   hydrALAZINE   25 mg Oral TID   levothyroxine   50 mcg Oral q morning   pantoprazole   20 mg Oral Daily   Continuous Infusions:  cefTRIAXone  (ROCEPHIN )  IV       LOS: 1 day   Ivonne Mustache, MD [facility] P1/29/2026, 1:02 PM  "

## 2024-11-20 NOTE — Plan of Care (Signed)
" °  Problem: Activity: Goal: Capacity to carry out activities will improve Outcome: Progressing   Problem: Clinical Measurements: Goal: Will remain free from infection Outcome: Progressing Goal: Diagnostic test results will improve Outcome: Progressing   "

## 2024-11-20 NOTE — Progress Notes (Signed)
 Pt is back on the tele monitor. However, after two attempts, pt refused all her 2200 medications. MD was notified. Pt has a sitter in the room.

## 2024-11-20 NOTE — Evaluation (Addendum)
 Occupational Therapy Evaluation Patient Details Name: Jennifer Patel MRN: [id number] DOB: 07-Oct-1932 Today's Date: 11/20/2024   History of Present Illness   89 y.o. female presents to [facility] 11/19/24 with SOB and fatigue. Admitted with acute exacerbation of CHF and acute hypoxic respiratory failure. PMHx:  CKD stage IIIb, hypertension, resistant hypertension     Clinical Impressions PTA, pt lives in senior apartment, typically Modified Independent with ADLs, light household IADLs and mobility using RW. Pt has family or transportation services for community IADLs. Pt presents now near baseline with minor deficits in endurance, strength and standing balance. Pt requires no more than Supervision for ADLs and transfers. Pt faithfully uses RW and shower chair at home to decrease fall risk. Pt interested in [facility] therapies upon DC to maximize independence and strength. Pt functionally appropriate for DC home once medically cleared. Will continue to follow while admitted.   HR low 100s SpO2 95% on RA     If plan is discharge home, recommend the following:   Assist for transportation;Other (comment) (PRN)     Functional Status Assessment   Patient has had a recent decline in their functional status and demonstrates the ability to make significant improvements in function in a reasonable and predictable amount of time.     Equipment Recommendations   None recommended by OT     Recommendations for Other Services   Speech consult     Precautions/Restrictions   Precautions Precautions: Fall Recall of Precautions/Restrictions: Intact Restrictions Weight Bearing Restrictions Per Provider Order: No     Mobility Bed Mobility Overal bed mobility: Modified Independent             General bed mobility comments: with HOB elevated    Transfers Overall transfer level: Modified independent Equipment used: None                      Balance Overall balance assessment:  Mild deficits observed, not formally tested                                         ADL either performed or assessed with clinical judgement   ADL Overall ADL's : Needs assistance/impaired Eating/Feeding: Independent   Grooming: Supervision/safety;Standing   Upper Body Bathing: Set up;Sitting   Lower Body Bathing: Supervison/ safety   Upper Body Dressing : Set up   Lower Body Dressing: Supervision/safety Lower Body Dressing Details (indicate cue type and reason): bridging to pull jeans over waist in bed Toilet Transfer: Contact guard assist;Ambulation;Rolling walker (2 wheels)   Toileting- Clothing Manipulation and Hygiene: Supervision/safety;Sitting/lateral lean;Sit to/from stand         General ADL Comments: EOB/brief standing at bedside to assess ADL functional abilities prior to pt opting for return to bed and family member phone call during session. Discussed ongoing use of DME which pt uses faithfully. Pt reports interest in [facility] therapies upon DC     Vision Baseline Vision/History: 1 Wears glasses Ability to See in Adequate Light: 0 Adequate Patient Visual Report: No change from baseline Vision Assessment?: No apparent visual deficits     Perception         Praxis         Pertinent Vitals/Pain Pain Assessment Pain Assessment: No/denies pain     Extremity/Trunk Assessment Upper Extremity Assessment Upper Extremity Assessment: Right hand dominant;Overall [facility] for tasks assessed   Lower Extremity Assessment  Lower Extremity Assessment: Defer to PT evaluation RLE Deficits / Details: reports prior injury from fall, LE in hyperextension and ADD in standing and gait   Cervical / Trunk Assessment Cervical / Trunk Assessment: Kyphotic   Communication Communication Communication: Impaired Factors Affecting Communication: Hearing impaired   Cognition Arousal: Alert Behavior During Therapy: WFL for tasks assessed/performed Cognition: No  family/caregiver present to determine baseline             OT - Cognition Comments: minor attention deficits, overall pleasant and likely near baseline                 Following commands: Intact       Cueing  General Comments   Cueing Techniques: Verbal cues  Pt reporting feeling like something was stuck in her throat. Provided water to sip on sitting EOB though pt reports this did not help. Unclear if this is a new issue or not- RN notified   Exercises     Shoulder Instructions      Home Living Family/patient expects to be discharged to:: Private residence Living Arrangements: Alone Available Help at Discharge: Friend(s);Available PRN/intermittently;Family Type of Home: Apartment Home Access: Level entry     Home Layout: One level     Bathroom Shower/Tub: Chief Strategy Officer: Standard     Home Equipment: Pharmacist, Hospital (2 wheels)   Additional Comments: Independent Living vs senior apartment complex      Prior Functioning/Environment Prior Level of Function : Independent/Modified Independent             Mobility Comments: ModI with RW, hx of one fall ADLs Comments: Mod I for ADLs. Showers vs sink bathes depending on how she is feeling on a given day. Grandson assists with groceries as needed. Uses [employer] for transportation or friends    OT Problem List: Decreased activity tolerance;Impaired balance (sitting and/or standing);Decreased strength   OT Treatment/Interventions: Self-care/ADL training;Therapeutic exercise;DME and/or AE instruction;Energy conservation;Therapeutic activities;Patient/family education;Balance training      OT Goals(Current goals can be found in the care plan section)   Acute Rehab OT Goals Patient Stated Goal: home soon (tomorrow) OT Goal Formulation: With patient Time For Goal Achievement: 12/04/24 Potential to Achieve Goals: Good ADL Goals Pt Will Perform Lower Body Dressing: with modified  independence;sit to/from stand;sitting/lateral leans Pt Will Transfer to Toilet: with modified independence;ambulating Pt/caregiver will Perform Home Exercise Program: Increased strength;Both right and left upper extremity;With theraband;With Supervision;With written HEP provided Additional ADL Goal #1: Pt to verbalize at least 3 fall prevention strategies to implement in home environment   OT Frequency:  Min 1X/week    Co-evaluation              AM-PAC OT 6 Clicks Daily Activity     Outcome Measure Help from another person eating meals?: None Help from another person taking care of personal grooming?: A Little Help from another person toileting, which includes using toliet, bedpan, or urinal?: A Little Help from another person bathing (including washing, rinsing, drying)?: A Little Help from another person to put on and taking off regular upper body clothing?: A Little Help from another person to put on and taking off regular lower body clothing?: A Little 6 Click Score: 19   End of Session    Activity Tolerance: Patient tolerated treatment well Patient left: in bed;with call bell/phone within reach;with bed alarm set  OT Visit Diagnosis: Other abnormalities of gait and mobility (R26.89)  Time: 8696-8678 OT Time Calculation (min): 18 min Charges:  OT General Charges $OT Visit: 1 Visit OT Evaluation $OT Eval Low Complexity: 1 Low  Mliss NOVAK, OTR/L Acute Rehab Services Office: [phone]   Mliss Fish 11/20/2024, 1:42 PM

## 2024-11-20 NOTE — Plan of Care (Signed)
" °  Problem: Education: Goal: Ability to verbalize understanding of medication therapies will improve Outcome: Progressing   Problem: Education: Goal: Ability to demonstrate management of disease process will improve Outcome: Progressing   Problem: Activity: Goal: Capacity to carry out activities will improve Outcome: Progressing   Problem: Cardiac: Goal: Ability to achieve and maintain adequate cardiopulmonary perfusion will improve Outcome: Progressing   Problem: Education: Goal: Knowledge of General Education information will improve Description: Including pain rating scale, medication(s)/side effects and non-pharmacologic comfort measures Outcome: Progressing   Problem: Clinical Measurements: Goal: Ability to maintain clinical measurements within normal limits will improve Outcome: Progressing   Problem: Clinical Measurements: Goal: Diagnostic test results will improve Outcome: Progressing   Problem: Clinical Measurements: Goal: Will remain free from infection Outcome: Progressing   Problem: Clinical Measurements: Goal: Cardiovascular complication will be avoided Outcome: Progressing   Problem: Activity: Goal: Risk for activity intolerance will decrease Outcome: Progressing   "

## 2024-11-20 NOTE — TOC Initial Note (Signed)
 Transition of Care [facility]) - Initial/Assessment Note    Patient Details  Name: Jennifer Patel MRN: [id number] Date of Birth: 02/06/1932  Transition of [facility]) CM/SW Contact:    Sudie Erminio Deems, RN Phone Number: 11/20/2024, 11:41 AM  Clinical Narrative: Patient presented for shortness of breath and fatigue. PTA patient was from home alone. Patient states she lives in [employer] in [place]. Patient has DME cane and rolling walker in the home. Patient states her grandson checks in often on her. Patient has PCP and has no issues with transportation to appointments. ICM discussed home health needs and the patient is agreeable to home health- patient did not have an agency preference. Referral submitted via the hub and [facility] has accepted the patient. Start of care to begin within 24-48 hours post transition home. Patient has stated she will need PTAR transport home.             Expected Discharge Plan: Home w Home Health Services Barriers to Discharge: No Barriers Identified   Patient Goals and CMS Choice Patient states their goals for this hospitalization and ongoing recovery are:: plans to return home once stable.   Choice offered to / list presented to : NA     Expected Discharge Plan and Services In-house Referral: NA Discharge Planning Services: CM Consult   Living arrangements for the past 2 months: Apartment                   DME Agency: NA       HH Arranged: PT, OT HH Agency: [facility] Date HH Agency Contacted: 11/20/24 Time HH Agency Contacted: 1131 Representative spoke with at [facility] Agency: Hub  Prior Living Arrangements/Services Living arrangements for the past 2 months: Apartment Lives with:: Self Patient language and need for interpreter reviewed:: Yes Do you feel safe going back to the place where you live?: Yes      Need for Family Participation in Patient Care: No (Comment) Care giver support system in place?: No  (comment)   Criminal Activity/Legal Involvement Pertinent to Current Situation/Hospitalization: No - Comment as needed    Permission Sought/Granted Permission sought to share information with : Family Supports, Case Production Designer, Theatre/television/film, Oceanographer granted to share information with : Yes, Verbal Permission Granted     Permission granted to share info w AGENCY: [facility]    Emotional Assessment Appearance:: Appears stated age Attitude/Demeanor/Rapport: Engaged Affect (typically observed): Appropriate Orientation: : Oriented to Self, Oriented to Place, Oriented to  Time, Oriented to Situation Alcohol  / Substance Use: Not Applicable Psych Involvement: No (comment)  Admission diagnosis:  Acute exacerbation of CHF (congestive heart failure) (HCC) [I50.9] Dyspnea, unspecified type [R06.00] Community acquired pneumonia, unspecified laterality [J18.9] Heart failure, unspecified HF chronicity, unspecified heart failure type (HCC) [I50.9] Patient Active Problem List   Diagnosis Date Noted   Acute exacerbation of CHF (congestive heart failure) (HCC) 11/19/2024   Acute hypoxic respiratory failure (HCC) 11/19/2024   CKD stage 3b, GFR 30-44 ml/min (HCC) 11/19/2024   GERD (gastroesophageal reflux disease) 11/19/2024   Pain due to onychomycosis of toenails of both feet 01/25/2021   Callus 01/25/2021   Anemia 03/12/2020   Femur fracture, right (HCC) 03/11/2020   Closed bicondylar fracture of distal end of right femur (HCC)    Resistant hypertension 11/13/2015   AKI (acute kidney injury) 05/22/2015   Abnormal EKG 05/22/2015   Hypertension 05/22/2015   Hypothyroidism 05/22/2015   Hyperlipidemia 05/22/2015   Neck pain 05/22/2015  PCP:  Gerome Brunet, DO Pharmacy:   [facility] [address] [place] - [address] [place] [address] Phone: [phone] Fax: [phone]  [facility] [place], [place] - [address] [facility] [address] [place] [address] Phone: [phone] Fax: [phone]  [facility] [address] [place] [address] Phone: [phone] Fax: [phone]     Social Drivers of Health (SDOH) Social History: SDOH Screenings   Food Insecurity: No Food Insecurity (11/20/2024)  Housing: Low Risk (11/20/2024)  Tobacco Use: Medium Risk (11/19/2024)   SDOH Interventions:     Readmission Risk Interventions     No data to display

## 2024-11-20 NOTE — Evaluation (Signed)
 Physical Therapy Evaluation Patient Details Name: Jennifer Patel MRN: [id number] DOB: 05/15/1932 Today's Date: 11/20/2024  History of Present Illness  89 y.o. female presents to [facility] 11/19/24 with SOB and fatigue. Admitted with acute exacerbation of CHF and acute hypoxic respiratory failure. PMHx:  CKD stage IIIb, hypertension, resistant hypertension  Clinical Impression  PTA pt lived alone at an ILF. Pt was ModI with use of RW and had assist for transportation. Pt presents close to if not at mobility baseline with R>L LE weakness, impaired balance, and decreased activity tolerance. Pt required ModI for bed mobility and ModI for transfers with RW. Able to then ambulate 136ft with supervision and RW. Pt reported prior fall and injury to R LE with R LE in hyperextension and hip ADD/knee valgus. Pt has intermittent assist available upon d/c home. Recommending HHPT to maximize return to PLOF with transition to OP PT. Acute PT to follow.   94% SpO2 on RA        If plan is discharge home, recommend the following: Assist for transportation;Help with stairs or ramp for entrance   Can travel by private vehicle    Yes    Equipment Recommendations None recommended by PT     Functional Status Assessment Patient has had a recent decline in their functional status and demonstrates the ability to make significant improvements in function in a reasonable and predictable amount of time.     Precautions / Restrictions Precautions Precautions: Fall Recall of Precautions/Restrictions: Intact Restrictions Weight Bearing Restrictions Per Provider Order: No      Mobility  Bed Mobility Overal bed mobility: Modified Independent    General bed mobility comments: with HOB elevated    Transfers Overall transfer level: Modified independent Equipment used: Rolling walker (2 wheels), None   Ambulation/Gait Ambulation/Gait assistance: Supervision Gait Distance (Feet): 120 Feet Assistive device: Rolling  walker (2 wheels) Gait Pattern/deviations: Step-through pattern, Knee hyperextension - right, Decreased stride length Gait velocity: decr    General Gait Details: R LE in hyperextension with limited knee flexion during swing phase, positioned in ADD/valgus     Balance Overall balance assessment: Mild deficits observed, not formally tested, History of Falls          Pertinent Vitals/Pain Pain Assessment Pain Assessment: No/denies pain    Home Living Family/patient expects to be discharged to:: Private residence Living Arrangements: Alone Available Help at Discharge: Friend(s);Available PRN/intermittently Type of Home: Apartment Home Access: Level entry       Home Layout: One level Home Equipment: Pharmacist, Hospital (2 wheels) Additional Comments: ILF, getting HH toilet    Prior Function Prior Level of Function : Independent/Modified Independent    Mobility Comments: ModI with RW, hx of one fall ADLs Comments: Uses [employer] for transportation or friends     Extremity/Trunk Assessment   Upper Extremity Assessment Upper Extremity Assessment: Defer to OT evaluation    Lower Extremity Assessment Lower Extremity Assessment: RLE deficits/detail;Generalized weakness RLE Deficits / Details: reports prior injury from fall, LE in hyperextension and ADD in standing and gait    Cervical / Trunk Assessment Cervical / Trunk Assessment: Kyphotic  Communication   Communication Communication: Impaired Factors Affecting Communication: Hearing impaired    Cognition Arousal: Alert Behavior During Therapy: WFL for tasks assessed/performed   PT - Cognitive impairments: No apparent impairments    Following commands: Intact       Cueing Cueing Techniques: Verbal cues      PT Assessment Patient needs continued PT services  PT  Problem List Decreased strength;Decreased activity tolerance;Decreased balance;Decreased mobility       PT Treatment Interventions DME  instruction;Gait training;Functional mobility training;Therapeutic activities;Therapeutic exercise;Balance training;Neuromuscular re-education;Patient/family education    PT Goals (Current goals can be found in the Care Plan section)  Acute Rehab PT Goals Patient Stated Goal: to go home PT Goal Formulation: With patient Time For Goal Achievement: 12/04/24 Potential to Achieve Goals: Good    Frequency Min 1X/week        AM-PAC PT 6 Clicks Mobility  Outcome Measure Help needed turning from your back to your side while in a flat bed without using bedrails?: None Help needed moving from lying on your back to sitting on the side of a flat bed without using bedrails?: None Help needed moving to and from a bed to a chair (including a wheelchair)?: None Help needed standing up from a chair using your arms (e.g., wheelchair or bedside chair)?: None Help needed to walk in hospital room?: A Little Help needed climbing 3-5 steps with a railing? : A Lot 6 Click Score: 21    End of Session   Activity Tolerance: Patient tolerated treatment well Patient left: in bed;with call bell/phone within reach;with bed alarm set Nurse Communication: Mobility status PT Visit Diagnosis: Unsteadiness on feet (R26.81);Other abnormalities of gait and mobility (R26.89);Muscle weakness (generalized) (M62.81);History of falling (Z91.81)    Time: 9194-9174 PT Time Calculation (min) (ACUTE ONLY): 20 min   Charges:   PT Evaluation $PT Eval Low Complexity: 1 Low   PT General Charges $$ ACUTE PT VISIT: 1 Visit       Kate ORN, PT, DPT Secure Chat Preferred  Rehab Office [phone]  Kate BRAVO [name] 11/20/2024, 8:34 AM

## 2024-11-20 NOTE — Progress Notes (Signed)
 The patient was seen and examined.  She is feeling better she wants to be discharged in AM.  I agree with current plan of care.

## 2024-11-20 NOTE — Care Management CC44 (Signed)
"         Condition Code 44 Documentation Completed  Patient Details  Name: Jennifer Patel MRN: [id number] Date of Birth: [birth year]   Condition Code 44 given:  Yes Patient signature on Condition Code 44 notice:  Yes Documentation of 2 MD's agreement:  Yes Code 44 added to claim:  Yes    Sudie Erminio Deems, RN 11/20/2024, 10:30 AM  "

## 2024-11-21 DIAGNOSIS — I1A0 Resistant hypertension: Secondary | ICD-10-CM | POA: Diagnosis present

## 2024-11-21 DIAGNOSIS — R0602 Shortness of breath: Secondary | ICD-10-CM | POA: Diagnosis present

## 2024-11-21 DIAGNOSIS — Z1152 Encounter for screening for COVID-19: Secondary | ICD-10-CM | POA: Diagnosis not present

## 2024-11-21 DIAGNOSIS — Z8249 Family history of ischemic heart disease and other diseases of the circulatory system: Secondary | ICD-10-CM | POA: Diagnosis not present

## 2024-11-21 DIAGNOSIS — Z87891 Personal history of nicotine dependence: Secondary | ICD-10-CM | POA: Diagnosis not present

## 2024-11-21 DIAGNOSIS — N1832 Chronic kidney disease, stage 3b: Secondary | ICD-10-CM | POA: Diagnosis present

## 2024-11-21 DIAGNOSIS — F05 Delirium due to known physiological condition: Secondary | ICD-10-CM | POA: Diagnosis present

## 2024-11-21 DIAGNOSIS — I5033 Acute on chronic diastolic (congestive) heart failure: Secondary | ICD-10-CM | POA: Diagnosis present

## 2024-11-21 DIAGNOSIS — Z881 Allergy status to other antibiotic agents status: Secondary | ICD-10-CM | POA: Diagnosis not present

## 2024-11-21 DIAGNOSIS — Z91148 Patient's other noncompliance with medication regimen for other reason: Secondary | ICD-10-CM | POA: Diagnosis not present

## 2024-11-21 DIAGNOSIS — Z79890 Hormone replacement therapy (postmenopausal): Secondary | ICD-10-CM | POA: Diagnosis not present

## 2024-11-21 DIAGNOSIS — M419 Scoliosis, unspecified: Secondary | ICD-10-CM | POA: Diagnosis present

## 2024-11-21 DIAGNOSIS — T502X5A Adverse effect of carbonic-anhydrase inhibitors, benzothiadiazides and other diuretics, initial encounter: Secondary | ICD-10-CM | POA: Diagnosis not present

## 2024-11-21 DIAGNOSIS — N179 Acute kidney failure, unspecified: Secondary | ICD-10-CM | POA: Diagnosis not present

## 2024-11-21 DIAGNOSIS — Z888 Allergy status to other drugs, medicaments and biological substances status: Secondary | ICD-10-CM | POA: Diagnosis not present

## 2024-11-21 DIAGNOSIS — Z79899 Other long term (current) drug therapy: Secondary | ICD-10-CM | POA: Diagnosis not present

## 2024-11-21 DIAGNOSIS — Z9049 Acquired absence of other specified parts of digestive tract: Secondary | ICD-10-CM | POA: Diagnosis not present

## 2024-11-21 DIAGNOSIS — Z90710 Acquired absence of both cervix and uterus: Secondary | ICD-10-CM | POA: Diagnosis not present

## 2024-11-21 DIAGNOSIS — J9601 Acute respiratory failure with hypoxia: Secondary | ICD-10-CM | POA: Diagnosis present

## 2024-11-21 DIAGNOSIS — I130 Hypertensive heart and chronic kidney disease with heart failure and stage 1 through stage 4 chronic kidney disease, or unspecified chronic kidney disease: Secondary | ICD-10-CM | POA: Diagnosis present

## 2024-11-21 DIAGNOSIS — I509 Heart failure, unspecified: Secondary | ICD-10-CM | POA: Diagnosis not present

## 2024-11-21 DIAGNOSIS — I959 Hypotension, unspecified: Secondary | ICD-10-CM | POA: Diagnosis not present

## 2024-11-21 DIAGNOSIS — K219 Gastro-esophageal reflux disease without esophagitis: Secondary | ICD-10-CM | POA: Diagnosis present

## 2024-11-21 DIAGNOSIS — R5381 Other malaise: Secondary | ICD-10-CM | POA: Diagnosis present

## 2024-11-21 DIAGNOSIS — E039 Hypothyroidism, unspecified: Secondary | ICD-10-CM | POA: Diagnosis present

## 2024-11-21 DIAGNOSIS — E7800 Pure hypercholesterolemia, unspecified: Secondary | ICD-10-CM | POA: Diagnosis present

## 2024-11-21 LAB — BASIC METABOLIC PANEL WITH GFR
Anion gap: 15 (ref 5–15)
BUN: 41 mg/dL — ABNORMAL HIGH (ref 8–23)
CO2: 24 mmol/L (ref 22–32)
Calcium: 8.7 mg/dL — ABNORMAL LOW (ref 8.9–10.3)
Chloride: 99 mmol/L (ref 98–111)
Creatinine, Ser: 1.85 mg/dL — ABNORMAL HIGH (ref 0.44–1.00)
GFR, Estimated: 25 mL/min — ABNORMAL LOW
Glucose, Bld: 81 mg/dL (ref 70–99)
Potassium: 4.3 mmol/L (ref 3.5–5.1)
Sodium: 138 mmol/L (ref 135–145)

## 2024-11-21 MED ORDER — LORAZEPAM 1 MG PO TABS
1.0000 mg | ORAL_TABLET | Freq: Four times a day (QID) | ORAL | Status: DC | PRN
  Administered 2024-11-21: 1 mg via ORAL
  Filled 2024-11-21 (×2): qty 1

## 2024-11-21 MED ORDER — MIDODRINE HCL 5 MG PO TABS
5.0000 mg | ORAL_TABLET | ORAL | Status: AC
  Administered 2024-11-21: 5 mg via ORAL
  Filled 2024-11-21: qty 1

## 2024-11-21 MED ORDER — LORAZEPAM 2 MG/ML IJ SOLN: 1.0000 mg | Freq: Four times a day (QID) | INTRAMUSCULAR | Status: DC | PRN

## 2024-11-21 MED ORDER — LORAZEPAM 2 MG/ML IJ SOLN: 1.0000 mg | Freq: Once | INTRAMUSCULAR | Status: DC

## 2024-11-21 MED ORDER — ALBUMIN HUMAN 25 % IV SOLN
25.0000 g | INTRAVENOUS | Status: AC
  Administered 2024-11-21: 12.5 g via INTRAVENOUS
  Filled 2024-11-21: qty 100

## 2024-11-21 MED ORDER — AMOXICILLIN-POT CLAVULANATE 875-125 MG PO TABS: 1.0000 | ORAL_TABLET | Freq: Two times a day (BID) | ORAL | Status: DC

## 2024-11-21 MED ORDER — AMOXICILLIN-POT CLAVULANATE 500-125 MG PO TABS
1.0000 | ORAL_TABLET | Freq: Two times a day (BID) | ORAL | Status: DC
  Administered 2024-11-21 – 2024-11-22 (×2): 1 via ORAL
  Filled 2024-11-21 (×3): qty 1

## 2024-11-21 MED ORDER — LEVOTHYROXINE SODIUM 75 MCG PO TABS
75.0000 ug | ORAL_TABLET | Freq: Every morning | ORAL | Status: DC
  Administered 2024-11-22 – 2024-11-23 (×2): 75 ug via ORAL
  Filled 2024-11-21 (×2): qty 1

## 2024-11-21 NOTE — Plan of Care (Signed)
" °  Problem: Education: Goal: Ability to demonstrate management of disease process will improve Outcome: Progressing   Problem: Education: Goal: Ability to verbalize understanding of medication therapies will improve Outcome: Progressing   Problem: Cardiac: Goal: Ability to achieve and maintain adequate cardiopulmonary perfusion will improve Outcome: Progressing   Problem: Education: Goal: Knowledge of General Education information will improve Description: Including pain rating scale, medication(s)/side effects and non-pharmacologic comfort measures Outcome: Progressing   Problem: Clinical Measurements: Goal: Ability to maintain clinical measurements within normal limits will improve Outcome: Progressing   Problem: Clinical Measurements: Goal: Diagnostic test results will improve Outcome: Progressing   Problem: Activity: Goal: Risk for activity intolerance will decrease Outcome: Progressing   Problem: Safety: Goal: Ability to remain free from injury will improve Outcome: Progressing   Problem: Skin Integrity: Goal: Risk for impaired skin integrity will decrease Outcome: Progressing   Problem: Pain Managment: Goal: General experience of comfort will improve and/or be controlled Outcome: Progressing   "

## 2024-11-21 NOTE — Progress Notes (Signed)
 " PROGRESS NOTE  Jennifer Patel  FMW:[id number] DOB: [birth year] DOA: 11/19/2024 PCP: Gerome Brunet, DO   Brief Narrative: Patient is a 89 year old female with history of CKD stage IIIb with baseline creatinine around 1.3, hypertension, scoliosis who presented with complaint of shortness of breath, fatigue.  She is from senior apartments at [place].  On presentation she was hypertensive, hypoxic. lab work showed severely elevated BNP, creatinine 1.3, TSH of 10.3.  Chest x-ray showed features of volume overload, cardiomegaly, bibasilar consolidations suspicious for atelectasis versus pneumonia.  Started on antibiotic, given few doses  of IV Lasix .  Was planned for discharge today but became hypotensive last night, remains confused likely from delirium.  Plan for discharge after improvement in the mentation  Assessment & Plan:  Principal Problem:   Acute exacerbation of CHF (congestive heart failure) (HCC) Active Problems:   Acute hypoxic respiratory failure (HCC)   Resistant hypertension   Hypertension   Hypothyroidism   CKD stage 3b, GFR 30-44 ml/min (HCC)   GERD (gastroesophageal reflux disease)   Hyperlipidemia  Acute HFpEF exacerbation: No prior echo on system.  Severely elevated BNP on presentation.  Remains on room air now.  Given few doses of IV Lasix . Chest x-ray shows features of CHF, cardiomegaly. She was taking Lasix  20 mg 3 times a week but apparently not taking at present. Echo showed EF of 50 to 55%, grade 1 diastolic dysfunction.  Further IV Lasix  on hold.  Looks euvolemic today no peripheral edema.   Acute hypoxic respiratory failure: On presentation her oxygenation dropped to 70s with ambulation.  Currently on room air.  Suspected pneumonia versus atelectasis: Chest x-ray with bibasilar consolidation suspicious for atelectasis versus pneumonia. Has been on   ceftriaxone  /doxycycline  for now.  Afebrile, no leukocytosis.  Antibiotics can be changed to oral on  discharge  Delirium: Elderly patient.  She was overall oriented on 1/29.  Became confused since last night.  This is most likely delirium.  Continue delirium precautions.  I do not suspect any underlying acute intracranial abnormalities.  UA not suspicious for UTI.  Make the room bright with day light, continue frequent reorientation  AKI on CKD stage IIIb: Baseline creatinine 1.3.  Creatinine trending up to 1.8 today.  Likely from hypotensive episode.  Check BMP tomorrow.  Will hold starting  fluid for now.  Allow spontaneous improvement  GERD: Continue PPI  Hypothyroidism: Takes levothyroxine  50 mcg daily.    Elevated TSH.  Free T4 normal.  Will increase levothyroxine  to 75 mcg  Hypertension: Hypertensive on presentation.  Became hypotensive last night.  Currently normotensive.  Continue to hold antihypertensives  Hyperlipidemia: On Lipitor 10 mg at home  Debility/deconditioning/severe scoliosis: Patient lives at senior apartments at [place].  PT recommended home health on discharge.  TOC following.  She ambulates with a walker.  Became confused since last night.  Will continue to keep her here until safe discharge plan         DVT prophylaxis:enoxaparin  (LOVENOX ) injection 30 mg Start: 11/19/24 2200 Place TED hose Start: 11/19/24 1026     Code Status: Full Code  Family Communication: Called grandson Franky on phone on 1/29: Call received Florence Franky, Erminio and Paton again today, calls not received  Patient status:obs  Patient is from : Home  Anticipated discharge to: Home  Estimated DC date: 1-2 days, pending improvement in the mentation   Consultants: None  Procedures: None  Antimicrobials:  Anti-infectives (From admission, onward)    Start  Dose/Rate Route Frequency Ordered Stop   11/20/24 1300  cefTRIAXone  (ROCEPHIN ) 2 g in sodium chloride  0.9 % 100 mL IVPB        2 g 200 mL/hr over 30 Minutes Intravenous Every 24 hours 11/20/24 1204 11/24/24 1259    11/20/24 1000  doxycycline  (VIBRA -TABS) tablet 100 mg        100 mg Oral Every 12 hours 11/20/24 0705 11/24/24 2159   11/19/24 1900  doxycycline  (VIBRAMYCIN ) 100 mg in sodium chloride  0.9 % 250 mL IVPB  Status:  Discontinued        100 mg 125 mL/hr over 120 Minutes Intravenous Every 12 hours 11/19/24 1849 11/20/24 0705   11/19/24 0915  cefTRIAXone  (ROCEPHIN ) 2 g in sodium chloride  0.9 % 100 mL IVPB        2 g 200 mL/hr over 30 Minutes Intravenous  Once 11/19/24 0910 11/19/24 1031       Subjective: Patient seen and examined at bedside today.  She became hypotensive last night.  This morning blood pressure stable.  She remains confused but not agitated, lying in bed.  On room air.  Does not appear to be in any Respiratory distress.  No peripheral edema.  Objective: Vitals:   11/21/24 0539 11/21/24 0709 11/21/24 0733 11/21/24 0738  BP: (!) 126/55 (!) 95/47 (!) 89/51 138/67  Pulse:    75  Resp: 18 14 16 16   Temp: 97.9 F (36.6 C)   (!) 97.5 F (36.4 C)  TempSrc: Oral   Oral  SpO2: 93%   96%  Weight:      Height:        Intake/Output Summary (Last 24 hours) at 11/21/2024 1138 Last data filed at 11/21/2024 0542 Gross per 24 hour  Intake 0 ml  Output --  Net 0 ml   Filed Weights   11/19/24 1945 11/20/24 0343 11/21/24 0446  Weight: 44.6 kg 45.3 kg 37.5 kg    Examination:   General exam: Overall comfortable, not in distress, pleasantly confused elderly female HEENT: PERRL Respiratory system: Few bibasilar crackles Cardiovascular system: S1 & S2 heard, RRR.  Gastrointestinal system: Abdomen is nondistended, soft and nontender. Central nervous system: Alert and awake but not oriented Extremities: No edema, no clubbing ,no cyanosis Skin: No rashes, no ulcers,no icterus  MSK: Severe scoliosis   Data Reviewed: I have personally reviewed following labs and imaging studies  CBC: Recent Labs  Lab 11/19/24 0800 11/20/24 0410  WBC 7.1 5.9  NEUTROABS 5.8  --   HGB 10.6*  11.7*  HCT 32.5* 34.9*  MCV 94.8 91.6  PLT 340 363   Basic Metabolic Panel: Recent Labs  Lab 11/19/24 0800 11/20/24 0410 11/21/24 0854  NA 138 137 138  K 4.1 3.4* 4.3  CL 102 96* 99  CO2 23 25 24   GLUCOSE 131* 90 81  BUN 23 23 41*  CREATININE 1.30* 1.41* 1.85*  CALCIUM  9.3 8.9 8.7*     Recent Results (from the past 240 hours)  Resp panel by RT-PCR (RSV, Flu A&B, Covid) Anterior Nasal Swab     Status: None   Collection Time: 11/19/24  6:58 AM   Specimen: Anterior Nasal Swab  Result Value Ref Range Status   SARS Coronavirus 2 by RT PCR NEGATIVE NEGATIVE Final   Influenza A by PCR NEGATIVE NEGATIVE Final   Influenza B by PCR NEGATIVE NEGATIVE Final    Comment: (NOTE) The Xpert Xpress SARS-CoV-2/FLU/RSV plus assay is intended as an aid in the diagnosis of influenza  from Nasopharyngeal swab specimens and should not be used as a sole basis for treatment. Nasal washings and aspirates are unacceptable for Xpert Xpress SARS-CoV-2/FLU/RSV testing.  Fact Sheet for Patients: bloggercourse.com  Fact Sheet for Healthcare Providers: seriousbroker.it  This test is not yet approved or cleared by the [place]  FDA and has been authorized for detection and/or diagnosis of SARS-CoV-2 by FDA under an Emergency Use Authorization (EUA). This EUA will remain in effect (meaning this test can be used) for the duration of the COVID-19 declaration under Section 564(b)(1) of the Act, 21 U.S.C. section 360bbb-3(b)(1), unless the authorization is terminated or revoked.     Resp Syncytial Virus by PCR NEGATIVE NEGATIVE Final    Comment: (NOTE) Fact Sheet for Patients: bloggercourse.com  Fact Sheet for Healthcare Providers: seriousbroker.it  This test is not yet approved or cleared by the [place]  FDA and has been authorized for detection and/or diagnosis of SARS-CoV-2 by FDA under  an Emergency Use Authorization (EUA). This EUA will remain in effect (meaning this test can be used) for the duration of the COVID-19 declaration under Section 564(b)(1) of the Act, 21 U.S.C. section 360bbb-3(b)(1), unless the authorization is terminated or revoked.  Performed at [facility], [address], [place], [place] [address]      Radiology Studies: ECHOCARDIOGRAM COMPLETE Result Date: 11/20/2024    ECHOCARDIOGRAM REPORT   Patient Name:   GIRLIE VELTRI Date of Exam: 11/20/2024 Medical Rec #:  [id number]      Height:       59.0 in Accession #:    [id number]     Weight:       99.9 lb Date of Birth:  1932-01-17     BSA:          1.373 m Patient Age:    89 years       BP:           111/76 mmHg Patient Gender: F              HR:           84 bpm. Exam Location:  Inpatient Procedure: 2D Echo, Cardiac Doppler, Color Doppler and Intracardiac            Opacification Agent (Both Spectral and Color Flow Doppler were            utilized during procedure). Indications:    Dyspnea R06.00  History:        Patient has no prior history of Echocardiogram examinations.                 Risk Factors:Hypertension.  Sonographer:    Sydnee Wilson RDCS Referring Phys: AMY N COX IMPRESSIONS  1. Left ventricular ejection fraction, by estimation, is 50 to 55%. The left ventricle has low normal function. Left ventricular endocardial border not optimally defined to evaluate regional wall motion. Left ventricular diastolic parameters are consistent with Grade I diastolic dysfunction (impaired relaxation).  2. Right ventricular systolic function is normal. The right ventricular size is normal. Tricuspid regurgitation signal is inadequate for assessing PA pressure.  3. The mitral valve is degenerative. No evidence of mitral valve regurgitation. No evidence of mitral stenosis. Moderate mitral annular calcification.  4. The aortic valve was not well visualized. There is moderate calcification of the aortic valve. There  is moderate thickening of the aortic valve. Aortic valve regurgitation is mild. Mild to moderate aortic valve stenosis. Aortic valve area, by VTI measures 1.47 cm. Aortic  valve mean gradient measures 23.0 mmHg. Aortic valve Vmax measures 3.01 m/s.  5. The inferior vena cava is normal in size with greater than 50% respiratory variability, suggesting right atrial pressure of 3 mmHg. FINDINGS  Left Ventricle: Left ventricular ejection fraction, by estimation, is 50 to 55%. The left ventricle has low normal function. Left ventricular endocardial border not optimally defined to evaluate regional wall motion. Definity  contrast agent was given IV  to delineate the left ventricular endocardial borders. The left ventricular internal cavity size was normal in size. There is no left ventricular hypertrophy. Abnormal (paradoxical) septal motion, consistent with left bundle branch block. Left ventricular diastolic parameters are consistent with Grade I diastolic dysfunction (impaired relaxation). Indeterminate filling pressures. Right Ventricle: The right ventricular size is normal. No increase in right ventricular wall thickness. Right ventricular systolic function is normal. Tricuspid regurgitation signal is inadequate for assessing PA pressure. Left Atrium: Left atrial size was normal in size. Right Atrium: Right atrial size was normal in size. Pericardium: There is no evidence of pericardial effusion. Mitral Valve: The mitral valve is degenerative in appearance. There is mild thickening of the mitral valve leaflet(s). There is mild calcification of the mitral valve leaflet(s). Moderate mitral annular calcification. No evidence of mitral valve regurgitation. No evidence of mitral valve stenosis. Tricuspid Valve: The tricuspid valve is normal in structure. Tricuspid valve regurgitation is mild . No evidence of tricuspid stenosis. Aortic Valve: The aortic valve was not well visualized. There is moderate calcification of the  aortic valve. There is moderate thickening of the aortic valve. Aortic valve regurgitation is mild. Mild to moderate aortic stenosis is present. Aortic valve mean gradient measures 23.0 mmHg. Aortic valve peak gradient measures 36.2 mmHg. Aortic valve area, by VTI measures 1.47 cm. Pulmonic Valve: The pulmonic valve was normal in structure. Pulmonic valve regurgitation is not visualized. No evidence of pulmonic stenosis. Aorta: The aortic root is normal in size and structure. Venous: The inferior vena cava is normal in size with greater than 50% respiratory variability, suggesting right atrial pressure of 3 mmHg. IAS/Shunts: No atrial level shunt detected by color flow Doppler.  LEFT VENTRICLE PLAX 2D LVIDd:         3.80 cm     Diastology LVIDs:         2.60 cm     LV e' medial:    3.48 cm/s LV PW:         0.90 cm     LV E/e' medial:  24.3 LV IVS:        0.60 cm     LV e' lateral:   4.46 cm/s LVOT diam:     2.00 cm     LV E/e' lateral: 18.9 LV SV:         95 LV SV Index:   69 LVOT Area:     3.14 cm  LV Volumes (MOD) LV vol d, MOD A2C: 69.1 ml LV vol d, MOD A4C: 96.4 ml LV vol s, MOD A2C: 30.8 ml LV vol s, MOD A4C: 44.5 ml LV SV MOD A2C:     38.3 ml LV SV MOD A4C:     96.4 ml LV SV MOD BP:      45.6 ml RIGHT VENTRICLE RV S prime:     16.80 cm/s TAPSE (M-mode): 1.9 cm LEFT ATRIUM             Index        RIGHT ATRIUM  Index LA diam:        2.10 cm 1.53 cm/m   RA Area:     7.29 cm LA Vol (A2C):   26.1 ml 19.01 ml/m  RA Volume:   14.30 ml 10.41 ml/m LA Vol (A4C):   19.8 ml 14.42 ml/m LA Biplane Vol: 24.1 ml 17.55 ml/m  AORTIC VALVE AV Area (Vmax):    1.59 cm AV Area (Vmean):   1.33 cm AV Area (VTI):     1.47 cm AV Vmax:           301.00 cm/s AV Vmean:          228.000 cm/s AV VTI:            0.644 m AV Peak Grad:      36.2 mmHg AV Mean Grad:      23.0 mmHg LVOT Vmax:         152.38 cm/s LVOT Vmean:        96.500 cm/s LVOT VTI:          0.302 m LVOT/AV VTI ratio: 0.47  AORTA Ao Root diam: 2.90 cm  MITRAL VALVE MV Area (PHT): 2.35 cm     SHUNTS MV Decel Time: 323 msec     Systemic VTI:  0.30 m MV E velocity: 84.40 cm/s   Systemic Diam: 2.00 cm MV A velocity: 122.00 cm/s MV E/A ratio:  0.69 Wilbert Bihari MD Electronically signed by Wilbert Bihari MD Signature Date/Time: 11/20/2024/1:26:04 PM    Final     Scheduled Meds:  atorvastatin   10 mg Oral Daily   docusate sodium   100 mg Oral BID   doxycycline   100 mg Oral Q12H   enoxaparin  (LOVENOX ) injection  30 mg Subcutaneous QHS   levothyroxine   50 mcg Oral q morning   pantoprazole   20 mg Oral Daily   Continuous Infusions:  cefTRIAXone  (ROCEPHIN )  IV 2 g (11/20/24 1310)     LOS: 1 day   Ivonne Mustache, MD [facility] P1/30/2026, 11:38 AM  "

## 2024-11-21 NOTE — Progress Notes (Signed)
 PHARMACY NOTE:  ANTIMICROBIAL RENAL DOSAGE ADJUSTMENT  Current antimicrobial regimen includes a mismatch between antimicrobial dosage and estimated renal function.  As per policy approved by the Pharmacy & Therapeutics and Medical Executive Committees, the antimicrobial dosage will be adjusted accordingly.  Current antimicrobial dosage:  augmentin  875mg  BID  Indication: PNA  Renal Function:  Estimated Creatinine Clearance: 12 mL/min (A) (by C-G formula based on SCr of 1.85 mg/dL (H)). []      On intermittent HD, scheduled: []      On CRRT    Antimicrobial dosage has been changed to:  Augmentin  500mg  q12h  Additional comments:   Thank you for allowing pharmacy to be a part of this patient's care.  Ozell ONEIDA [place], [facility] 11/21/2024 12:05 PM

## 2024-11-21 NOTE — Progress Notes (Signed)
 Pt refusing IV access. Pt answered questions and is CAOx4 and continues to refuse IV access. Will reassess later. Informed RN to put in consult when pt is ready for new IV access.

## 2024-11-21 NOTE — Plan of Care (Signed)
 Hypotension-low blood pressure 85/45 MAP 58.  Patient has been admitted for acute CHF exacerbation received Lasix  and currently on amlodipine  and hydralazine .  In the setting of soft blood pressure Lasix  has been on hold since daytime.  Holding amlodipine  and hydralazine .   -giving albumin  25 g and midodrine  5 mg.  Rawson Minix, MD [facility] 11/21/2024, 1:49 AM

## 2024-11-21 NOTE — Progress Notes (Signed)
 Mobility Specialist Progress Note;   11/21/24 1430  Mobility  Activity Ambulated with assistance  Level of Assistance Contact guard assist, steadying assist  Assistive Device Front wheel walker  Distance Ambulated (ft) 100 ft  Activity Response Tolerated well  Mobility Referral Yes  Mobility visit 1 Mobility  Mobility Specialist Start Time (ACUTE ONLY) 1430  Mobility Specialist Stop Time (ACUTE ONLY) 1450  Mobility Specialist Time Calculation (min) (ACUTE ONLY) 20 min   Pt received on EoB agreeable to mobility. No physical assistance required, CGA for safety. No complaints throughout session. V/c given for redirection. Pt returned to EoB with call bell and personal belongings in reach. NT in room. All needs met.   Ricky Janeal MATSU, BS Mobility Specialist Please contact via Special Educational Needs Teacher or [employer] [phone]

## 2024-11-21 NOTE — Progress Notes (Signed)
 Pt BP is 77/52. Jennifer Sundil, MD was notified. MD agreed with holding hydralazine . Albumin  25 g and midodrine  5 mg was ordered. Pt received 12.5 g of albumin  and refused the other 12.5 g albumin  and midodrine  5 mg.  Pt was very restless, agitated, combative but aox4. Pt pulled her iv out, and walked back and forth on the hallway with the sitter walking behind her. MD was notified. Ativan  was ordered. Pt refused iv start, lab, and Lovenox  shot. However, pt took the ativan  and the rest of her 2200 medications. Pt is calm and in bed at this point.

## 2024-11-21 NOTE — Plan of Care (Signed)
" °  Problem: Activity: Goal: Capacity to carry out activities will improve Outcome: Progressing   Problem: Cardiac: Goal: Ability to achieve and maintain adequate cardiopulmonary perfusion will improve Outcome: Progressing   Problem: Education: Goal: Knowledge of General Education information will improve Description: Including pain rating scale, medication(s)/side effects and non-pharmacologic comfort measures Outcome: Progressing   Problem: Health Behavior/Discharge Planning: Goal: Ability to manage health-related needs will improve Outcome: Progressing   Problem: Clinical Measurements: Goal: Ability to maintain clinical measurements within normal limits will improve Outcome: Progressing Goal: Will remain free from infection Outcome: Progressing Goal: Diagnostic test results will improve Outcome: Progressing Goal: Respiratory complications will improve Outcome: Progressing Goal: Cardiovascular complication will be avoided Outcome: Progressing   Problem: Activity: Goal: Risk for activity intolerance will decrease Outcome: Progressing   Problem: Nutrition: Goal: Adequate nutrition will be maintained Outcome: Progressing   Problem: Coping: Goal: Level of anxiety will decrease Outcome: Progressing   Problem: Elimination: Goal: Will not experience complications related to bowel motility Outcome: Progressing Goal: Will not experience complications related to urinary retention Outcome: Progressing   Problem: Pain Managment: Goal: General experience of comfort will improve and/or be controlled Outcome: Progressing   Problem: Safety: Goal: Ability to remain free from injury will improve Outcome: Progressing   Problem: Skin Integrity: Goal: Risk for impaired skin integrity will decrease Outcome: Progressing   "

## 2024-11-22 DIAGNOSIS — N179 Acute kidney failure, unspecified: Secondary | ICD-10-CM

## 2024-11-22 DIAGNOSIS — I5033 Acute on chronic diastolic (congestive) heart failure: Secondary | ICD-10-CM

## 2024-11-22 DIAGNOSIS — I509 Heart failure, unspecified: Secondary | ICD-10-CM | POA: Diagnosis not present

## 2024-11-22 LAB — BASIC METABOLIC PANEL WITH GFR
Anion gap: 18 — ABNORMAL HIGH (ref 5–15)
BUN: 54 mg/dL — ABNORMAL HIGH (ref 8–23)
CO2: 21 mmol/L — ABNORMAL LOW (ref 22–32)
Calcium: 8.5 mg/dL — ABNORMAL LOW (ref 8.9–10.3)
Chloride: 98 mmol/L (ref 98–111)
Creatinine, Ser: 2.1 mg/dL — ABNORMAL HIGH (ref 0.44–1.00)
GFR, Estimated: 22 mL/min — ABNORMAL LOW
Glucose, Bld: 95 mg/dL (ref 70–99)
Potassium: 4.3 mmol/L (ref 3.5–5.1)
Sodium: 137 mmol/L (ref 135–145)

## 2024-11-22 MED ORDER — SODIUM CHLORIDE 0.9 % IV BOLUS
250.0000 mL | Freq: Once | INTRAVENOUS | Status: AC
  Administered 2024-11-22: 250 mL via INTRAVENOUS

## 2024-11-22 NOTE — Progress Notes (Signed)
 [facility] PROGRESS NOTE    Progress Note  Jennifer Patel  FMW:[id number] DOB: [birth year] DOA: 11/19/2024 PCP: Gerome Brunet, DO     Brief Narrative:   Jennifer Patel is an 89 y.o. female past medical history of chronic kidney disease stage IIIb, essential hypertension comes in complaining of shortness of breath and fatigue in the ED he was hypoxic BNP was elevated chest x-ray showed pulmonary edema  Assessment/Plan:   Acute respiratory failure with hypoxia secondary to acute HFpEF: Started on IV Lasix  she diuresed well Lasix  had to be stopped as she became hypotensive and her creatinine rose. Lasix  has been held. Her baseline creatinine is around 1.3. Creatinine this morning is 2.1. Will give a bolus of normal saline. Continue strict I's and O's and daily weight check a basic metabolic panel in the morning. Discontinue antibiotics pneumonia has been ruled out.  Has remained afebrile with no cytosis.  Acute kidney injury on chronic kidney disease stage IIIb: Likely due to diuresis is baseline creatinine is around 1.3 bumped to up this morning to 2.1. Lasix  was held, was given albumin  with no improvement. Will give a bolus of normal saline recheck in the morning.  Acute confusional state: Elderly patient after she became hypotensive. Now resolved.  Hypothyroidism: Continue Synthroid  recheck in 6 weeks TSH and free T4.  Essential hypertension: Antihypertensive medications were held in the setting of hypotension and acute kidney injury will to allow renal perfusion. Allow permissive hypertension.  Deconditioning: PT evaluated the patient will need home health upon discharge.    DVT prophylaxis: lovenox  Family Communication:non Status is: Inpatient Remains inpatient appropriate because: Acute HFpEF    Code Status:     Code Status Orders  (From admission, onward)           Start     Ordered   11/19/24 1029  Full code  Continuous       Question:  By:   Answer:  Default: patient does not have capacity for decision making, no surrogate or prior directive available   11/19/24 1029           Code Status History     Date Active Date Inactive Code Status Order ID Comments User Context   03/11/2020 1708 03/15/2020 2125 Full Code [id number]  Laurita Cort DASEN, MD ED   11/16/2015 1357 11/16/2015 1902 Full Code [id number]  Ladona Heinz, MD Inpatient   05/22/2015 2040 05/24/2015 1721 Full Code [id number]  Celinda Alm Lot, MD Inpatient         IV Access:   Peripheral IV   Procedures and diagnostic studies:   ECHOCARDIOGRAM COMPLETE Result Date: 11/20/2024    ECHOCARDIOGRAM REPORT   Patient Name:   Jennifer Patel Date of Exam: 11/20/2024 Medical Rec #:  [id number]      Height:       59.0 in Accession #:    [id number]     Weight:       99.9 lb Date of Birth:  [birth year]     BSA:          1.373 m Patient Age:    89 years       BP:           111/76 mmHg Patient Gender: F              HR:           84 bpm. Exam Location:  Inpatient Procedure: 2D Echo, Cardiac Doppler, Color Doppler and Intracardiac  Opacification Agent (Both Spectral and Color Flow Doppler were            utilized during procedure). Indications:    Dyspnea R06.00  History:        Patient has no prior history of Echocardiogram examinations.                 Risk Factors:Hypertension.  Sonographer:    Sydnee Wilson RDCS Referring Phys: AMY N COX IMPRESSIONS  1. Left ventricular ejection fraction, by estimation, is 50 to 55%. The left ventricle has low normal function. Left ventricular endocardial border not optimally defined to evaluate regional wall motion. Left ventricular diastolic parameters are consistent with Grade I diastolic dysfunction (impaired relaxation).  2. Right ventricular systolic function is normal. The right ventricular size is normal. Tricuspid regurgitation signal is inadequate for assessing PA pressure.  3. The mitral valve is degenerative. No evidence of mitral valve  regurgitation. No evidence of mitral stenosis. Moderate mitral annular calcification.  4. The aortic valve was not well visualized. There is moderate calcification of the aortic valve. There is moderate thickening of the aortic valve. Aortic valve regurgitation is mild. Mild to moderate aortic valve stenosis. Aortic valve area, by VTI measures 1.47 cm. Aortic valve mean gradient measures 23.0 mmHg. Aortic valve Vmax measures 3.01 m/s.  5. The inferior vena cava is normal in size with greater than 50% respiratory variability, suggesting right atrial pressure of 3 mmHg. FINDINGS  Left Ventricle: Left ventricular ejection fraction, by estimation, is 50 to 55%. The left ventricle has low normal function. Left ventricular endocardial border not optimally defined to evaluate regional wall motion. Definity  contrast agent was given IV  to delineate the left ventricular endocardial borders. The left ventricular internal cavity size was normal in size. There is no left ventricular hypertrophy. Abnormal (paradoxical) septal motion, consistent with left bundle branch block. Left ventricular diastolic parameters are consistent with Grade I diastolic dysfunction (impaired relaxation). Indeterminate filling pressures. Right Ventricle: The right ventricular size is normal. No increase in right ventricular wall thickness. Right ventricular systolic function is normal. Tricuspid regurgitation signal is inadequate for assessing PA pressure. Left Atrium: Left atrial size was normal in size. Right Atrium: Right atrial size was normal in size. Pericardium: There is no evidence of pericardial effusion. Mitral Valve: The mitral valve is degenerative in appearance. There is mild thickening of the mitral valve leaflet(s). There is mild calcification of the mitral valve leaflet(s). Moderate mitral annular calcification. No evidence of mitral valve regurgitation. No evidence of mitral valve stenosis. Tricuspid Valve: The tricuspid valve is  normal in structure. Tricuspid valve regurgitation is mild . No evidence of tricuspid stenosis. Aortic Valve: The aortic valve was not well visualized. There is moderate calcification of the aortic valve. There is moderate thickening of the aortic valve. Aortic valve regurgitation is mild. Mild to moderate aortic stenosis is present. Aortic valve mean gradient measures 23.0 mmHg. Aortic valve peak gradient measures 36.2 mmHg. Aortic valve area, by VTI measures 1.47 cm. Pulmonic Valve: The pulmonic valve was normal in structure. Pulmonic valve regurgitation is not visualized. No evidence of pulmonic stenosis. Aorta: The aortic root is normal in size and structure. Venous: The inferior vena cava is normal in size with greater than 50% respiratory variability, suggesting right atrial pressure of 3 mmHg. IAS/Shunts: No atrial level shunt detected by color flow Doppler.  LEFT VENTRICLE PLAX 2D LVIDd:         3.80 cm     Diastology LVIDs:  2.60 cm     LV e' medial:    3.48 cm/s LV PW:         0.90 cm     LV E/e' medial:  24.3 LV IVS:        0.60 cm     LV e' lateral:   4.46 cm/s LVOT diam:     2.00 cm     LV E/e' lateral: 18.9 LV SV:         95 LV SV Index:   69 LVOT Area:     3.14 cm  LV Volumes (MOD) LV vol d, MOD A2C: 69.1 ml LV vol d, MOD A4C: 96.4 ml LV vol s, MOD A2C: 30.8 ml LV vol s, MOD A4C: 44.5 ml LV SV MOD A2C:     38.3 ml LV SV MOD A4C:     96.4 ml LV SV MOD BP:      45.6 ml RIGHT VENTRICLE RV S prime:     16.80 cm/s TAPSE (M-mode): 1.9 cm LEFT ATRIUM             Index        RIGHT ATRIUM          Index LA diam:        2.10 cm 1.53 cm/m   RA Area:     7.29 cm LA Vol (A2C):   26.1 ml 19.01 ml/m  RA Volume:   14.30 ml 10.41 ml/m LA Vol (A4C):   19.8 ml 14.42 ml/m LA Biplane Vol: 24.1 ml 17.55 ml/m  AORTIC VALVE AV Area (Vmax):    1.59 cm AV Area (Vmean):   1.33 cm AV Area (VTI):     1.47 cm AV Vmax:           301.00 cm/s AV Vmean:          228.000 cm/s AV VTI:            0.644 m AV Peak Grad:       36.2 mmHg AV Mean Grad:      23.0 mmHg LVOT Vmax:         152.38 cm/s LVOT Vmean:        96.500 cm/s LVOT VTI:          0.302 m LVOT/AV VTI ratio: 0.47  AORTA Ao Root diam: 2.90 cm MITRAL VALVE MV Area (PHT): 2.35 cm     SHUNTS MV Decel Time: 323 msec     Systemic VTI:  0.30 m MV E velocity: 84.40 cm/s   Systemic Diam: 2.00 cm MV A velocity: 122.00 cm/s MV E/A ratio:  0.69 Wilbert Bihari MD Electronically signed by Wilbert Bihari MD Signature Date/Time: 11/20/2024/1:26:04 PM    Final      Medical Consultants:   None.   Subjective:    Jennifer Patel no complaints  Objective:    Vitals:   11/21/24 2004 11/22/24 0017 11/22/24 0400 11/22/24 0410  BP: 125/64 (!) 152/73  (!) 129/54  Pulse: 81 80  79  Resp: 19 17  19   Temp: 97.8 F (36.6 C) 97.9 F (36.6 C)  98.7 F (37.1 C)  TempSrc: Oral Oral  Oral  SpO2:      Weight:   37.4 kg   Height:       SpO2: 97 %   Intake/Output Summary (Last 24 hours) at 11/22/2024 0655 Last data filed at 11/21/2024 1500 Gross per 24 hour  Intake 120 ml  Output --  Net 120  ml   Filed Weights   11/20/24 0343 11/21/24 0446 11/22/24 0400  Weight: 45.3 kg 37.5 kg 37.4 kg    Exam: General exam: In no acute distress. Respiratory system: Good air movement and clear to auscultation. Cardiovascular system: S1 & S2 heard, RRR. No JVD. Gastrointestinal system: Abdomen is nondistended, soft and nontender.  Extremities: No pedal edema. Skin: No rashes, lesions or ulcers Psychiatry: Judgement and insight appear normal. Mood & affect appropriate.    Data Reviewed:    Labs: Basic Metabolic Panel: Recent Labs  Lab 11/19/24 0800 11/20/24 0410 11/21/24 0854 11/22/24 0428  NA 138 137 138 137  K 4.1 3.4* 4.3 4.3  CL 102 96* 99 98  CO2 23 25 24  21*  GLUCOSE 131* 90 81 95  BUN 23 23 41* 54*  CREATININE 1.30* 1.41* 1.85* 2.10*  CALCIUM  9.3 8.9 8.7* 8.5*   GFR Estimated Creatinine Clearance: 10.5 mL/min (A) (by C-G formula based on SCr of 2.1  mg/dL (H)). Liver Function Tests: No results for input(s): AST, ALT, ALKPHOS, BILITOT, PROT, ALBUMIN  in the last 168 hours. No results for input(s): LIPASE, AMYLASE in the last 168 hours. No results for input(s): AMMONIA in the last 168 hours. Coagulation profile No results for input(s): INR, PROTIME in the last 168 hours. COVID-19 Labs  No results for input(s): DDIMER, FERRITIN, LDH, CRP in the last 72 hours.  Lab Results  Component Value Date   SARSCOV2NAA NEGATIVE 11/19/2024   SARSCOV2NAA NEGATIVE 10/19/2023   SARSCOV2NAA NEGATIVE 03/15/2020   SARSCOV2NAA NEGATIVE 03/11/2020    CBC: Recent Labs  Lab 11/19/24 0800 11/20/24 0410  WBC 7.1 5.9  NEUTROABS 5.8  --   HGB 10.6* 11.7*  HCT 32.5* 34.9*  MCV 94.8 91.6  PLT 340 363   Cardiac Enzymes: No results for input(s): CKTOTAL, CKMB, CKMBINDEX, TROPONINI in the last 168 hours. BNP (last 3 results) Recent Labs    11/19/24 0800  PROBNP 20,388.0*   CBG: No results for input(s): GLUCAP in the last 168 hours. D-Dimer: No results for input(s): DDIMER in the last 72 hours. Hgb A1c: No results for input(s): HGBA1C in the last 72 hours. Lipid Profile: No results for input(s): CHOL, HDL, LDLCALC, TRIG, CHOLHDL, LDLDIRECT in the last 72 hours. Thyroid  function studies: Recent Labs    11/19/24 0800  TSH 10.300*   Anemia work up: No results for input(s): VITAMINB12, FOLATE, FERRITIN, TIBC, IRON, RETICCTPCT in the last 72 hours. Sepsis Labs: Recent Labs  Lab 11/19/24 0800 11/20/24 0410  WBC 7.1 5.9   Microbiology Recent Results (from the past 240 hours)  Resp panel by RT-PCR (RSV, Flu A&B, Covid) Anterior Nasal Swab     Status: None   Collection Time: 11/19/24  6:58 AM   Specimen: Anterior Nasal Swab  Result Value Ref Range Status   SARS Coronavirus 2 by RT PCR NEGATIVE NEGATIVE Final   Influenza A by PCR NEGATIVE NEGATIVE Final   Influenza B by  PCR NEGATIVE NEGATIVE Final    Comment: (NOTE) The Xpert Xpress SARS-CoV-2/FLU/RSV plus assay is intended as an aid in the diagnosis of influenza from Nasopharyngeal swab specimens and should not be used as a sole basis for treatment. Nasal washings and aspirates are unacceptable for Xpert Xpress SARS-CoV-2/FLU/RSV testing.  Fact Sheet for Patients: bloggercourse.com  Fact Sheet for Healthcare Providers: seriousbroker.it  This test is not yet approved or cleared by the [place]  FDA and has been authorized for detection and/or diagnosis of SARS-CoV-2 by FDA under an  Emergency Use Authorization (EUA). This EUA will remain in effect (meaning this test can be used) for the duration of the COVID-19 declaration under Section 564(b)(1) of the Act, 21 U.S.C. section 360bbb-3(b)(1), unless the authorization is terminated or revoked.     Resp Syncytial Virus by PCR NEGATIVE NEGATIVE Final    Comment: (NOTE) Fact Sheet for Patients: bloggercourse.com  Fact Sheet for Healthcare Providers: seriousbroker.it  This test is not yet approved or cleared by the [place]  FDA and has been authorized for detection and/or diagnosis of SARS-CoV-2 by FDA under an Emergency Use Authorization (EUA). This EUA will remain in effect (meaning this test can be used) for the duration of the COVID-19 declaration under Section 564(b)(1) of the Act, 21 U.S.C. section 360bbb-3(b)(1), unless the authorization is terminated or revoked.  Performed at [facility], [address] [place], [place] [address]      Medications:    amoxicillin -clavulanate  1 tablet Oral BID   atorvastatin   10 mg Oral Daily   doxycycline   100 mg Oral Q12H   enoxaparin  (LOVENOX ) injection  30 mg Subcutaneous QHS   levothyroxine   75 mcg Oral q morning   pantoprazole   20 mg Oral Daily   Continuous Infusions:     LOS: 2 days   Erle Odell Castor  [facility]  11/22/2024, 6:55 AM

## 2024-11-22 NOTE — Plan of Care (Signed)
" °  Problem: Education: Goal: Ability to verbalize understanding of medication therapies will improve Outcome: Progressing   Problem: Activity: Goal: Capacity to carry out activities will improve Outcome: Progressing   Problem: Cardiac: Goal: Ability to achieve and maintain adequate cardiopulmonary perfusion will improve Outcome: Progressing   Problem: Clinical Measurements: Goal: Will remain free from infection Outcome: Progressing   Problem: Clinical Measurements: Goal: Diagnostic test results will improve Outcome: Progressing   Problem: Clinical Measurements: Goal: Cardiovascular complication will be avoided Outcome: Progressing   Problem: Activity: Goal: Risk for activity intolerance will decrease Outcome: Progressing   Problem: Elimination: Goal: Will not experience complications related to bowel motility Outcome: Progressing   Problem: Coping: Goal: Level of anxiety will decrease Outcome: Progressing   "

## 2024-11-23 ENCOUNTER — Other Ambulatory Visit (HOSPITAL_COMMUNITY): Payer: Self-pay

## 2024-11-23 DIAGNOSIS — I5033 Acute on chronic diastolic (congestive) heart failure: Secondary | ICD-10-CM | POA: Diagnosis not present

## 2024-11-23 DIAGNOSIS — I509 Heart failure, unspecified: Secondary | ICD-10-CM | POA: Diagnosis not present

## 2024-11-23 LAB — BASIC METABOLIC PANEL WITH GFR
Anion gap: 15 (ref 5–15)
BUN: 49 mg/dL — ABNORMAL HIGH (ref 8–23)
CO2: 21 mmol/L — ABNORMAL LOW (ref 22–32)
Calcium: 8.7 mg/dL — ABNORMAL LOW (ref 8.9–10.3)
Chloride: 103 mmol/L (ref 98–111)
Creatinine, Ser: 1.51 mg/dL — ABNORMAL HIGH (ref 0.44–1.00)
GFR, Estimated: 32 mL/min — ABNORMAL LOW
Glucose, Bld: 92 mg/dL (ref 70–99)
Potassium: 3.9 mmol/L (ref 3.5–5.1)
Sodium: 139 mmol/L (ref 135–145)

## 2024-11-23 MED ORDER — NEBIVOLOL HCL 20 MG PO TABS
1.0000 | ORAL_TABLET | Freq: Every day | ORAL | 0 refills | Status: DC
  Filled 2024-11-23: qty 30, 30d supply, fill #0

## 2024-11-23 NOTE — Progress Notes (Signed)
 AVS  med necessity face sheet all in packet for transport

## 2024-11-23 NOTE — Plan of Care (Signed)
" °  Problem: Education: Goal: Ability to verbalize understanding of medication therapies will improve Outcome: Progressing   Problem: Cardiac: Goal: Ability to achieve and maintain adequate cardiopulmonary perfusion will improve Outcome: Progressing   Problem: Activity: Goal: Capacity to carry out activities will improve Outcome: Progressing   Problem: Education: Goal: Knowledge of General Education information will improve Description: Including pain rating scale, medication(s)/side effects and non-pharmacologic comfort measures Outcome: Progressing   "

## 2024-11-23 NOTE — TOC Transition Note (Signed)
 Transition of Care [facility]) - Discharge Note   Patient Details  Name: Jennifer Patel MRN: [id number] Date of Birth: 10/08/1932  [facility] CM/SW Contact:  Robynn Eileen Hoose, RN Phone Number: 11/23/2024, 10:22 AM   Clinical Narrative:   Patient is being discharged. Spoke with patient confirmed discharge address and PT[facility] transportation need.  Medical necessity form and facesheet sent to unit. PTAR transportation arranged. [facility] aware of patient being discharged.    Final next level of care: Home w Home Health Services Barriers to Discharge: No Barriers Identified   Patient Goals and CMS Choice Patient states their goals for this hospitalization and ongoing recovery are:: plans to return home once stable.   Choice offered to / list presented to : NA      Discharge Placement                       Discharge Plan and Services Additional resources added to the After Visit Summary for   In-house Referral: NA Discharge Planning Services: CM Consult              DME Agency: NA       HH Arranged: PT, OT HH Agency: [facility] Date HH Agency Contacted: 11/20/24 Time HH Agency Contacted: 1131 Representative spoke with at [facility] Agency: [facility]  Social Drivers of Health (SDOH) Interventions SDOH Screenings   Food Insecurity: No Food Insecurity (11/22/2024)  Housing: Low Risk (11/22/2024)  Transportation Needs: No Transportation Needs (11/22/2024)  Utilities: Not At Risk (11/22/2024)  Social Connections: Moderately Isolated (11/22/2024)  Tobacco Use: Medium Risk (11/19/2024)     Readmission Risk Interventions     No data to display

## 2024-11-23 NOTE — Progress Notes (Signed)
 DISCHARGE NOTE SNF Jennifer Patel to be discharged Home per MD order. Patient verbalized understanding.  Skin clean, dry and intact without evidence of skin break down, no evidence of skin tears noted. IV catheter discontinued intact. Site without signs and symptoms of complications. Dressing and pressure applied. Pt denies pain at the site currently. No complaints noted.  Patient free of lines, drains, and wounds.   Discharge packet assembled. An After Visit Summary (AVS) was printed and given to the EMS personnel. Patient escorted via stretcher and discharged to [facility] via ambulance. Report called to accepting facility; all questions and concerns addressed.   Peyton SHAUNNA Pepper, RN

## 2024-11-24 ENCOUNTER — Telehealth: Payer: Self-pay

## 2024-11-26 ENCOUNTER — Telehealth: Payer: Self-pay

## 2024-11-26 NOTE — Transitions of Care (Post Inpatient/ED Visit) (Signed)
" ° °  11/26/2024  Name: Jennifer Patel MRN: [id number] DOB: Jul 04, 1932  Today's TOC FU Call Status: Today's TOC FU Call Status:: Unsuccessful Call (2nd Attempt) Unsuccessful Call (2nd Attempt) Date: 11/26/24  Attempted to reach the patient regarding the most recent Inpatient/ED visit.  Follow Up Plan: Additional outreach attempts will be made to reach the patient to complete the Transitions of Care (Post Inpatient/ED visit) call.   Bert Givans J. Samarra Ridgely RN, MSN [facility], [facility] RN Care Manager Direct Dial: [phone]  Fax: [phone] Website: [email or web address]   "

## 2025-02-02 ENCOUNTER — Ambulatory Visit: Admitting: Podiatry
